# Patient Record
Sex: Male | Born: 1950 | Race: Black or African American | Hispanic: No | Smoking: Never smoker
Health system: Southern US, Community
[De-identification: ages and names within clinical notes are randomized; demographics above are authoritative.]

## PROBLEM LIST (undated history)

## (undated) DIAGNOSIS — I10 Essential (primary) hypertension: Secondary | ICD-10-CM

## (undated) DIAGNOSIS — I2510 Atherosclerotic heart disease of native coronary artery without angina pectoris: Secondary | ICD-10-CM

## (undated) DIAGNOSIS — E119 Type 2 diabetes mellitus without complications: Secondary | ICD-10-CM

## (undated) HISTORY — PX: KNEE SURGERY: SHX244

---

## 2014-02-02 DIAGNOSIS — R351 Nocturia: Secondary | ICD-10-CM

## 2014-02-02 DIAGNOSIS — I10 Essential (primary) hypertension: Secondary | ICD-10-CM | POA: Insufficient documentation

## 2014-02-02 DIAGNOSIS — E119 Type 2 diabetes mellitus without complications: Secondary | ICD-10-CM

## 2014-02-02 DIAGNOSIS — N529 Male erectile dysfunction, unspecified: Secondary | ICD-10-CM | POA: Insufficient documentation

## 2014-02-02 HISTORY — DX: Nocturia: R35.1

## 2014-02-02 HISTORY — DX: Male erectile dysfunction, unspecified: N52.9

## 2014-02-02 HISTORY — DX: Type 2 diabetes mellitus without complications: E11.9

## 2017-02-27 ENCOUNTER — Other Ambulatory Visit: Payer: Self-pay

## 2017-02-27 ENCOUNTER — Emergency Department (HOSPITAL_BASED_OUTPATIENT_CLINIC_OR_DEPARTMENT_OTHER): Payer: Medicare Other

## 2017-02-27 ENCOUNTER — Inpatient Hospital Stay (HOSPITAL_BASED_OUTPATIENT_CLINIC_OR_DEPARTMENT_OTHER)
Admission: EM | Admit: 2017-02-27 | Discharge: 2017-02-28 | DRG: 282 | Disposition: A | Discharge status: Home / Self Care | Payer: Medicare Other | Attending: Cardiovascular Disease | Admitting: Cardiovascular Disease

## 2017-02-27 ENCOUNTER — Encounter (HOSPITAL_BASED_OUTPATIENT_CLINIC_OR_DEPARTMENT_OTHER): Payer: Self-pay

## 2017-02-27 DIAGNOSIS — E785 Hyperlipidemia, unspecified: Secondary | ICD-10-CM | POA: Diagnosis present

## 2017-02-27 DIAGNOSIS — Z7984 Long term (current) use of oral hypoglycemic drugs: Secondary | ICD-10-CM

## 2017-02-27 DIAGNOSIS — Z8249 Family history of ischemic heart disease and other diseases of the circulatory system: Secondary | ICD-10-CM

## 2017-02-27 DIAGNOSIS — E119 Type 2 diabetes mellitus without complications: Secondary | ICD-10-CM | POA: Diagnosis not present

## 2017-02-27 DIAGNOSIS — I2510 Atherosclerotic heart disease of native coronary artery without angina pectoris: Secondary | ICD-10-CM | POA: Diagnosis present

## 2017-02-27 DIAGNOSIS — E7849 Other hyperlipidemia: Secondary | ICD-10-CM | POA: Diagnosis not present

## 2017-02-27 DIAGNOSIS — I214 Non-ST elevation (NSTEMI) myocardial infarction: Secondary | ICD-10-CM

## 2017-02-27 DIAGNOSIS — I10 Essential (primary) hypertension: Secondary | ICD-10-CM | POA: Diagnosis not present

## 2017-02-27 DIAGNOSIS — N400 Enlarged prostate without lower urinary tract symptoms: Secondary | ICD-10-CM | POA: Diagnosis present

## 2017-02-27 DIAGNOSIS — E041 Nontoxic single thyroid nodule: Secondary | ICD-10-CM | POA: Diagnosis not present

## 2017-02-27 HISTORY — DX: Non-ST elevation (NSTEMI) myocardial infarction: I21.4

## 2017-02-27 HISTORY — DX: Essential (primary) hypertension: I10

## 2017-02-27 HISTORY — DX: Type 2 diabetes mellitus without complications: E11.9

## 2017-02-27 LAB — HEPATIC FUNCTION PANEL
ALBUMIN: 4.5 g/dL (ref 3.5–5.0)
ALT: 33 U/L (ref 17–63)
AST: 24 U/L (ref 15–41)
Alkaline Phosphatase: 85 U/L (ref 38–126)
BILIRUBIN TOTAL: 0.9 mg/dL (ref 0.3–1.2)
Bilirubin, Direct: 0.2 mg/dL (ref 0.1–0.5)
Indirect Bilirubin: 0.7 mg/dL (ref 0.3–0.9)
Total Protein: 7.3 g/dL (ref 6.5–8.1)

## 2017-02-27 LAB — CBC
HEMATOCRIT: 48.7 % (ref 39.0–52.0)
HEMOGLOBIN: 16.8 g/dL (ref 13.0–17.0)
MCH: 32 pg (ref 26.0–34.0)
MCHC: 34.5 g/dL (ref 30.0–36.0)
MCV: 92.8 fL (ref 78.0–100.0)
PLATELETS: 197 10*3/uL (ref 150–400)
RBC: 5.25 MIL/uL (ref 4.22–5.81)
RDW: 13.4 % (ref 11.5–15.5)
WBC: 7.3 10*3/uL (ref 4.0–10.5)

## 2017-02-27 LAB — BASIC METABOLIC PANEL
ANION GAP: 10 (ref 5–15)
BUN: 18 mg/dL (ref 6–20)
CALCIUM: 9.4 mg/dL (ref 8.9–10.3)
CO2: 25 mmol/L (ref 22–32)
Chloride: 100 mmol/L — ABNORMAL LOW (ref 101–111)
Creatinine, Ser: 0.96 mg/dL (ref 0.61–1.24)
GFR calc non Af Amer: >60 mL/min (ref >60)
GLUCOSE: 165 mg/dL — AB (ref 65–99)
POTASSIUM: 3.6 mmol/L (ref 3.5–5.1)
Sodium: 135 mmol/L (ref 135–145)

## 2017-02-27 LAB — GLUCOSE, CAPILLARY
GLUCOSE-CAPILLARY: 245 mg/dL — AB (ref 65–99)
Glucose-Capillary: 140 mg/dL — ABNORMAL HIGH (ref 65–99)

## 2017-02-27 LAB — LIPASE, BLOOD: Lipase: 33 U/L (ref 11–51)

## 2017-02-27 LAB — TROPONIN I
Troponin I: 0.18 ng/mL (ref <0.03)
Troponin I: 0.16 ng/mL (ref <0.03)

## 2017-02-27 MED ORDER — METOPROLOL SUCCINATE ER 100 MG PO TB24
100.0000 mg | ORAL_TABLET | Freq: Every day | ORAL | Status: DC
  Administered 2017-02-27 – 2017-02-28 (×2): 100 mg via ORAL
  Filled 2017-02-27 (×2): qty 1

## 2017-02-27 MED ORDER — ASPIRIN EC 81 MG PO TBEC
81.0000 mg | DELAYED_RELEASE_TABLET | Freq: Every day | ORAL | Status: DC
  Administered 2017-02-28: 81 mg via ORAL
  Filled 2017-02-27: qty 1

## 2017-02-27 MED ORDER — AMLODIPINE BESYLATE 5 MG PO TABS
5.0000 mg | ORAL_TABLET | Freq: Every day | ORAL | Status: DC
  Administered 2017-02-27 – 2017-02-28 (×2): 5 mg via ORAL
  Filled 2017-02-27 (×2): qty 1

## 2017-02-27 MED ORDER — HEPARIN (PORCINE) IN NACL 100-0.45 UNIT/ML-% IJ SOLN
16.0000 [IU]/kg/h | INTRAMUSCULAR | Status: DC
  Administered 2017-02-27: 16 [IU]/kg/h via INTRAVENOUS
  Filled 2017-02-27: qty 250

## 2017-02-27 MED ORDER — NITROGLYCERIN 0.4 MG SL SUBL
0.4000 mg | SUBLINGUAL_TABLET | SUBLINGUAL | Status: DC | PRN
  Administered 2017-02-27 (×2): 0.4 mg via SUBLINGUAL
  Filled 2017-02-27: qty 1

## 2017-02-27 MED ORDER — LISINOPRIL 40 MG PO TABS
40.0000 mg | ORAL_TABLET | Freq: Every day | ORAL | Status: DC
  Administered 2017-02-27 – 2017-02-28 (×2): 40 mg via ORAL
  Filled 2017-02-27 (×2): qty 1

## 2017-02-27 MED ORDER — HYDROCHLOROTHIAZIDE 25 MG PO TABS
25.0000 mg | ORAL_TABLET | Freq: Every day | ORAL | Status: DC
  Administered 2017-02-27: 25 mg via ORAL
  Filled 2017-02-27: qty 1

## 2017-02-27 MED ORDER — IOPAMIDOL (ISOVUE-370) INJECTION 76%
100.0000 mL | Freq: Once | INTRAVENOUS | Status: AC | PRN
  Administered 2017-02-27: 100 mL via INTRAVENOUS

## 2017-02-27 MED ORDER — ASPIRIN 81 MG PO CHEW
324.0000 mg | CHEWABLE_TABLET | Freq: Once | ORAL | Status: AC
  Administered 2017-02-27: 324 mg via ORAL
  Filled 2017-02-27: qty 4

## 2017-02-27 MED ORDER — ONDANSETRON HCL 4 MG/2ML IJ SOLN: 4.0000 mg | Freq: Four times a day (QID) | INTRAMUSCULAR | Status: DC | PRN

## 2017-02-27 MED ORDER — ACETAMINOPHEN 325 MG PO TABS: 650.0000 mg | ORAL_TABLET | ORAL | Status: DC | PRN

## 2017-02-27 MED ORDER — ATORVASTATIN CALCIUM 20 MG PO TABS
20.0000 mg | ORAL_TABLET | Freq: Every day | ORAL | Status: DC
  Administered 2017-02-27: 20 mg via ORAL
  Filled 2017-02-27: qty 1

## 2017-02-27 MED ORDER — HEPARIN (PORCINE) IN NACL 100-0.45 UNIT/ML-% IJ SOLN
1700.0000 [IU]/h | INTRAMUSCULAR | Status: DC
  Administered 2017-02-28: 1700 [IU]/h via INTRAVENOUS
  Filled 2017-02-27: qty 250

## 2017-02-27 MED ORDER — HEPARIN BOLUS VIA INFUSION
4000.0000 [IU] | Freq: Once | INTRAVENOUS | Status: AC
  Administered 2017-02-27: 4000 [IU] via INTRAVENOUS

## 2017-02-27 NOTE — ED Notes (Addendum)
RN from [facility] called to advise pt was waiting in their lobby but left. They had drawn blood and his troponin was 0.11. EDP made aware.

## 2017-02-27 NOTE — ED Notes (Signed)
Report called Harvie HeckPaulette Tete, RN

## 2017-02-27 NOTE — ED Provider Notes (Signed)
[facility] EMERGENCY DEPARTMENT Provider Note   CSN: [id number] Arrival date & time: 02/27/17  1110     History   Chief Complaint Chief Complaint  Patient presents with  . Chest Pain    HPI Tyler Ellis is a 66 y.o. male.  HPI Patient states he had central chest pressure that woke him from sleep at 3 AM.  Associated with diaphoresis, nausea, shortness of breath.  States he has had intermittent left arm numbness for the past month.  The chest pressure has significantly improved.  Has bilateral lower extremity swelling which is unchanged.  No prior history of coronary artery disease.  No family history.  Denies history of smoking. Past Medical History:  Diagnosis Date  . Diabetes mellitus without complication (HCC)   . Hypertension     There are no active problems to display for this patient.   Past Surgical History:  Procedure Laterality Date  . KNEE SURGERY         Home Medications    Prior to Admission medications   Medication Sig Start Date End Date Taking? Authorizing Provider  acetaminophen-codeine (TYLENOL #3) 300-30 MG tablet Take every 4 (four) hours as needed by mouth for moderate pain.   Yes [provider]  amLODipine (NORVASC) 5 MG tablet Take 5 mg daily by mouth.   Yes [provider]  atorvastatin (LIPITOR) 20 MG tablet Take 20 mg daily by mouth.   Yes [provider]  diazepam (VALIUM) 5 MG tablet Take 5 mg every 6 (six) hours as needed by mouth for anxiety.   Yes [provider]  glipiZIDE (GLUCOTROL) 10 MG tablet Take 10 mg daily before breakfast by mouth.   Yes [provider]  hydrochlorothiazide (HYDRODIURIL) 25 MG tablet Take 25 mg daily by mouth.   Yes [provider]  lisinopril (PRINIVIL,ZESTRIL) 40 MG tablet Take 40 mg daily by mouth.   Yes [provider]  metFORMIN (GLUCOPHAGE) 1000 MG tablet Take 500 mg 2 (two) times daily with a meal by mouth.   Yes [provider]  metoprolol succinate (TOPROL-XL) 100 MG 24 hr tablet Take 100 mg daily by mouth. Take with or immediately following a meal.   Yes [provider]    Family History No family history on file.  Social History Social History   Tobacco Use  . Smoking status: Never Smoker  . Smokeless tobacco: Never Used  Substance Use Topics  . Alcohol use: Yes    Comment: weekly  . Drug use: No     Allergies   Patient has no known allergies.   Review of Systems Review of Systems  Constitutional: Positive for diaphoresis. Negative for chills and fever.  Respiratory: Positive for shortness of breath. Negative for cough.   Cardiovascular: Positive for chest pain and leg swelling. Negative for palpitations.  Gastrointestinal: Positive for abdominal pain and nausea. Negative for diarrhea and vomiting.  Genitourinary: Negative for flank pain, frequency and hematuria.  Musculoskeletal: Positive for back pain and myalgias. Negative for neck pain and neck stiffness.  Skin: Negative for rash and wound.  Neurological: Positive for numbness and headaches. Negative for dizziness, weakness and light-headedness.  All other systems reviewed and are negative.    Physical Exam Updated Vital Signs BP 104/81   Pulse 100   Temp 98.6 F (37 C) (Oral)   Resp (!) 23   Ht 5\' 7"  (1.702 m)   Wt 104.7 kg (230 lb 13.2 oz)   SpO2  98%   BMI 36.15 kg/m   Physical Exam  Constitutional: He is oriented to person, place, and time. He appears well-developed and well-nourished.  Non-toxic appearance. He does not appear ill. No distress.  HENT:  Head: Normocephalic and atraumatic.  Mouth/Throat: Oropharynx is clear and moist.  Eyes: EOM are normal. Pupils are equal, round, and reactive to light.  Neck: Normal range of motion. Neck supple. No JVD present.  Cardiovascular: Normal rate, regular rhythm and normal pulses.  Pulmonary/Chest: Effort normal and breath sounds normal. No accessory  muscle usage or stridor. No tachypnea. No respiratory distress. He has no decreased breath sounds.  Abdominal: Soft. Bowel sounds are normal. There is tenderness. There is no rebound and no guarding.  Patient with left upper quadrant tenderness to palpation.  No rebound or guarding.  Musculoskeletal: Normal range of motion. He exhibits no edema or tenderness.  1+ bilateral lower extremity pitting edema.  No calf asymmetry or tenderness.  Neurological: He is alert and oriented to person, place, and time.  Moving all extremities without focal deficit.  Sensation intact.  Skin: Skin is warm and dry. Capillary refill takes less than 2 seconds. No rash noted. No erythema.  Psychiatric: He has a normal mood and affect. His behavior is normal.  Nursing note and vitals reviewed.    ED Treatments / Results  Labs (all labs ordered are listed, but only abnormal results are displayed) Labs Reviewed  TROPONIN I - Abnormal; Notable for the following components:      Result Value   Troponin I 0.18 (*)    All other components within normal limits  BASIC METABOLIC PANEL - Abnormal; Notable for the following components:   Chloride 100 (*)    Glucose, Bld 165 (*)    All other components within normal limits  CBC  HEPATIC FUNCTION PANEL  LIPASE, BLOOD    EKG  EKG Interpretation  Date/Time:  Wednesday February 27 2017 11:20:53 EST Ventricular Rate:  112 PR Interval:  148 QRS Duration: 80 QT Interval:  344 QTC Calculation: 469 R Axis:   18 Text Interpretation:  Sinus tachycardia Otherwise normal ECG Confirmed by Loren Racer ([id number]) on 02/27/2017 3:21:44 PM       Radiology Dg Chest 2 View  Result Date: 02/27/2017 CLINICAL DATA:  Chest pain EXAM: CHEST  2 VIEW COMPARISON:  02/27/2017 FINDINGS: The heart size and mediastinal contours are within normal limits. Both lungs are clear. The visualized skeletal structures are unremarkable. IMPRESSION: No active cardiopulmonary disease.  Electronically Signed   By: Kennith Center M.D.   On: 02/27/2017 12:44   Ct Angio Chest Aorta W And/or Wo Contrast  Result Date: 02/27/2017 CLINICAL DATA:  Chest pain since early this morning, elevated troponin level. History of hypertension, diabetes. EXAM: CT ANGIOGRAPHY CHEST, ABDOMEN AND PELVIS TECHNIQUE: Multidetector CT imaging through the chest, abdomen and pelvis was performed using the standard protocol during bolus administration of intravenous contrast. Multiplanar reconstructed images and MIPs were obtained and reviewed to evaluate the vascular anatomy. CONTRAST:  100 cc Isovue 370 COMPARISON:  None. FINDINGS: CTA CHEST FINDINGS Cardiovascular: No thoracic aortic aneurysm or dissection. No evidence of intramural hematoma. Heart size is normal. No pericardial effusion. Scattered coronary artery calcifications. No pulmonary embolism identified within the main or central lobar pulmonary arteries. Mediastinum/Nodes: Esophagus appears normal. No mass or enlarged lymph nodes within the mediastinum or perihilar regions. 1.9 cm hypodense lesion within the left thyroid lobe. Trachea and central bronchi are unremarkable. Lungs/Pleura: Lungs are clear.  No pleural effusion or pneumothorax. Musculoskeletal: Mild degenerative change in the lower thoracic spine. No acute or suspicious osseous finding. Review of the MIP images confirms the above findings. CTA ABDOMEN AND PELVIS FINDINGS VASCULAR Aorta: Normal caliber aorta without aneurysm, dissection, vasculitis or significant stenosis. Celiac: Patent without evidence of aneurysm, dissection, vasculitis or significant stenosis. SMA: Patent without evidence of aneurysm, dissection, vasculitis or significant stenosis. Renals: Both renal arteries are patent without evidence of aneurysm, dissection, vasculitis, fibromuscular dysplasia or significant stenosis. IMA: Patent without evidence of aneurysm, dissection, vasculitis or significant stenosis. Inflow: Patent  without evidence of aneurysm, dissection, vasculitis or significant stenosis. Veins: No obvious venous abnormality within the limitations of this arterial phase study. Review of the MIP images confirms the above findings. NON-VASCULAR Hepatobiliary: No focal liver abnormality is seen. No gallstones, gallbladder wall thickening, or biliary dilatation. Pancreas: Unremarkable. No pancreatic ductal dilatation or surrounding inflammatory changes. Spleen: Normal in size without focal abnormality. Adrenals/Urinary Tract: Adrenal glands appear normal. Kidneys are unremarkable without mass, stone or hydronephrosis. No perinephric fluid. No ureteral or bladder calculi identified. Bladder is unremarkable, partially decompressed. Stomach/Bowel: Bowel is normal in caliber. No bowel wall thickening or evidence of bowel wall inflammation seen. Appendix is normal. Stomach is unremarkable. Lymphatic: No enlarged lymph nodes seen. Reproductive: Prostate gland is enlarged causing some mass effect on the bladder base. Other: No free fluid or abscess collection. No free intraperitoneal air. Musculoskeletal: Degenerative disc desiccations throughout the lumbar spine, at least moderate in degree. No acute or suspicious osseous finding. Review of the MIP images confirms the above findings. IMPRESSION: 1. Overall, no acute findings. No thoracic aortic aneurysm or dissection. No abdominal aortic aneurysm or dissection. Lungs are clear. No acute intra-abdominal or intrapelvic abnormality. 2. Coronary artery calcifications. Recommend correlation with any possible associated cardiac symptoms. Heart size is normal. No pericardial effusion. 3. 1.9 cm hypodense lesion within the left thyroid lobe. Per consensus guidelines, recommend nonemergent thyroid ultrasound for further characterization. 4. Prostate gland is at least moderately enlarged, causing mass effect on the bladder base. Consider correlation with PSA lab values. 5. 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 Milder degenerative change within the thoracic spine. No acute appearing osseous abnormality Electronically Signed   By: Bary RichardStan  Maynard M.D.   On: 02/27/2017 14:48   Ct Angio Abd/pel W/ And/or W/o  Result Date: 02/27/2017 CLINICAL DATA:  Chest pain since early this morning, elevated troponin level. History of hypertension, diabetes. EXAM: CT ANGIOGRAPHY CHEST, ABDOMEN AND PELVIS TECHNIQUE: Multidetector CT imaging through the chest, abdomen and pelvis was performed using the standard protocol during bolus administration of intravenous contrast. Multiplanar reconstructed images and MIPs were obtained and reviewed to evaluate the vascular anatomy. CONTRAST:  100 cc Isovue 370 COMPARISON:  None. FINDINGS: CTA CHEST FINDINGS Cardiovascular: No thoracic aortic aneurysm or dissection. No evidence of intramural hematoma. Heart size is normal. No pericardial effusion. Scattered coronary artery calcifications. No pulmonary embolism identified within the main or central lobar pulmonary arteries. Mediastinum/Nodes: Esophagus appears normal. No mass or enlarged lymph nodes within the mediastinum or perihilar regions. 1.9 cm hypodense lesion within the left thyroid lobe. Trachea and central bronchi are unremarkable. Lungs/Pleura: Lungs are clear.  No pleural effusion or pneumothorax. Musculoskeletal: Mild degenerative change in the lower thoracic spine. No acute or suspicious osseous finding. Review of the MIP images confirms the above findings. CTA ABDOMEN AND PELVIS FINDINGS VASCULAR Aorta: Normal caliber aorta without aneurysm, dissection, vasculitis or significant stenosis. Celiac: Patent without evidence  of aneurysm, dissection, vasculitis or significant stenosis. SMA: Patent without evidence of aneurysm, dissection, vasculitis or significant stenosis. Renals: Both renal arteries are patent without evidence of aneurysm, dissection, vasculitis, fibromuscular  dysplasia or significant stenosis. IMA: Patent without evidence of aneurysm, dissection, vasculitis or significant stenosis. Inflow: Patent without evidence of aneurysm, dissection, vasculitis or significant stenosis. Veins: No obvious venous abnormality within the limitations of this arterial phase study. Review of the MIP images confirms the above findings. NON-VASCULAR Hepatobiliary: No focal liver abnormality is seen. No gallstones, gallbladder wall thickening, or biliary dilatation. Pancreas: Unremarkable. No pancreatic ductal dilatation or surrounding inflammatory changes. Spleen: Normal in size without focal abnormality. Adrenals/Urinary Tract: Adrenal glands appear normal. Kidneys are unremarkable without mass, stone or hydronephrosis. No perinephric fluid. No ureteral or bladder calculi identified. Bladder is unremarkable, partially decompressed. Stomach/Bowel: Bowel is normal in caliber. No bowel wall thickening or evidence of bowel wall inflammation seen. Appendix is normal. Stomach is unremarkable. Lymphatic: No enlarged lymph nodes seen. Reproductive: Prostate gland is enlarged causing some mass effect on the bladder base. Other: No free fluid or abscess collection. No free intraperitoneal air. Musculoskeletal: Degenerative disc desiccations throughout the lumbar spine, at least moderate in degree. No acute or suspicious osseous finding. Review of the MIP images confirms the above findings. IMPRESSION: 1. Overall, no acute findings. No thoracic aortic aneurysm or dissection. No abdominal aortic aneurysm or dissection. Lungs are clear. No acute intra-abdominal or intrapelvic abnormality. 2. Coronary artery calcifications. Recommend correlation with any possible associated cardiac symptoms. Heart size is normal. No pericardial effusion. 3. 1.9 cm hypodense lesion within the left thyroid lobe. Per consensus guidelines, recommend nonemergent thyroid ultrasound for further characterization. 4. Prostate gland  is at least moderately enlarged, causing mass effect on the bladder base. Consider correlation with PSA lab values. 5. 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 Milder degenerative change within the thoracic spine. No acute appearing osseous abnormality Electronically Signed   By: Bary RichardStan  Maynard M.D.   On: 02/27/2017 14:48    Procedures Procedures (including critical care time)  Medications Ordered in ED Medications  nitroGLYCERIN (NITROSTAT) SL tablet 0.4 mg (0.4 mg Sublingual Given 02/27/17 1213)  heparin bolus via infusion 4,000 Units (not administered)  heparin ADULT infusion 100 units/mL (25000 units/26250mL sodium chloride 0.45%) (not administered)  aspirin chewable tablet 324 mg (324 mg Oral Given 02/27/17 1206)  iopamidol (ISOVUE-370) 76 % injection 100 mL (100 mLs Intravenous Contrast Given 02/27/17 1420)     Initial Impression / Assessment and Plan / ED Course  I have reviewed the triage vital signs and the nursing notes.  Pertinent labs & imaging results that were available during my care of the patient were reviewed by me and considered in my medical decision making (see chart for details).     Patient is now chest pain-free.  Received nitroglycerin and aspirin in the emergency department.  CT angios chest and abdomen pelvis without evidence of dissection.  He does have calcified coronary arteries.  Troponin is elevated.  Discussed with Dr. Tresa EndoKelly.  Advises heparin and [facility].  States the patient can eat at this point.  We will plan on likely catheterization tomorrow.    Final Clinical Impressions(s) / ED Diagnoses   Final diagnoses:  NSTEMI (non-ST elevated myocardial infarction) [facility](HCC)    ED Discharge Orders    None       Loren RacerYelverton, Ion Gonnella, MD 02/27/17 [phone]

## 2017-02-27 NOTE — Progress Notes (Signed)
ANTICOAGULATION CONSULT NOTE   Pharmacy Consult for heparin  Indication: chest pain/ACS  No Known Allergies  Patient Measurements: Height: 5\' 7"  (170.2 cm) Weight: 230 lb 13.2 oz (104.7 kg) IBW/kg (Calculated) : 66.1  Vital Signs: Temp: 98.6 F (37 C) (11/07 1126) Temp Source: Oral (11/07 1126) BP: 125/76 (11/07 1738) Pulse Rate: 99 (11/07 1738)  Labs: Recent Labs    02/27/17 1144 02/27/17 1240  HGB 16.8  --   HCT 48.7  --   PLT 197  --   CREATININE  --  0.96  TROPONINI 0.18*  --     Estimated Creatinine Clearance: 87.3 mL/min (by C-G formula based on SCr of 0.96 mg/dL).   Medical History: Past Medical History:  Diagnosis Date  . Diabetes mellitus without complication (HCC)   . Hypertension     Assessment:  66 yo male with CP/NSTEMI that [facility]. Currently heparin infusion at 1700 units/hr. CBC stable.    Goal of Therapy:  Heparin level 0.3-0.7 units/ml Monitor platelets by anticoagulation protocol: Yes   Plan:  1. Continue heparin infusion at current rate  2. Obtain heparin level  3. Noted plans for cath in am   Pollyann SamplesAndy Delayla Hoffmaster, PharmD, BCPS 02/27/2017, 7:03 PM

## 2017-02-27 NOTE — ED Triage Notes (Signed)
C/o CP since 3am-[facility]-NAD-steady gait

## 2017-02-27 NOTE — ED Notes (Signed)
Obt[facility] from [facility]

## 2017-02-27 NOTE — H&P (Signed)
History & Physical    Patient ID: Tyler Ellis MRN: [id number], DOB/AGE: 1951/01/26   Admit date: 02/27/2017   Primary Physician: Forrest Moron, MD Primary Cardiologist: New    Patient Profile    66 year old male with past medical history of hypertension, hyperlipidemia, insulin-dependent diabetes who presented to [facility] with chest pain.  Past Medical History   Past Medical History:  Diagnosis Date  . Diabetes mellitus without complication (HCC)   . Hypertension     Past Surgical History:  Procedure Laterality Date  . KNEE SURGERY       Allergies  No Known Allergies  History of Present Illness    Tyler Ellis is a 66 year old male with past medical history of hypertension, hyperlipidemia and insulin-dependent diabetes.  He denies any significant family history of CAD, but reports that his sister had a "light heart attack "several years ago.  States he has been on insulin for about a year, but has had diabetes for over 5 years.  Denies ever having seen a cardiologist in the past.  Over the past several weeks he has had left arm numbness, and seen his PCP for this.  States this morning at 3 AM he was awoken from sleep with centralized chest pressure, that brought him to tears.  States he woke up and attempted to place ice to his chest, and walk around to relieve the pain.  Pressure continued, and he eventually presented to med [facility] with his symptoms, after speaking with his PCP over the phone.  In the ED his labs showed stable electrolytes, creatinine 0.96, hemoglobin 16.8, troponin 0.18.  CT angios chest was negative for acute findings.  EKG showed sinus tachycardia with no acute ST/T wave abnormalities.  His case was discussed with cardiology, and he was started on IV heparin.  He was transferred to call for further workup.  Home Medications    Prior to Admission medications   Medication Sig Start Date End Date Taking? Authorizing Provider    acetaminophen-codeine (TYLENOL #3) 300-30 MG tablet Take every 4 (four) hours as needed by mouth for moderate pain.    [provider]  amLODipine (NORVASC) 5 MG tablet Take 5 mg daily by mouth.    [provider]  atorvastatin (LIPITOR) 20 MG tablet Take 20 mg daily by mouth.    [provider]  diazepam (VALIUM) 5 MG tablet Take 5 mg every 6 (six) hours as needed by mouth for anxiety.    [provider]  glipiZIDE (GLUCOTROL) 10 MG tablet Take 10 mg daily before breakfast by mouth.    [provider]  hydrochlorothiazide (HYDRODIURIL) 25 MG tablet Take 25 mg daily by mouth.    [provider]  lisinopril (PRINIVIL,ZESTRIL) 40 MG tablet Take 40 mg daily by mouth.    [provider]  metFORMIN (GLUCOPHAGE) 1000 MG tablet Take 500 mg 2 (two) times daily with a meal by mouth.    [provider]  metoprolol succinate (TOPROL-XL) 100 MG 24 hr tablet Take 100 mg daily by mouth. Take with or immediately following a meal.    [provider]    Family History    Family History  Problem Relation Age of Onset  . Hypertension Father     Social History    Social History   Socioeconomic History  . Marital status: Divorced    Spouse name: Not on file  . Number of children: Not on file  . Years of  education: Not on file  . Highest education level: Not on file  Social Needs  . Financial resource strain: Not on file  . Food insecurity - worry: Not on file  . Food insecurity - inability: Not on file  . Transportation needs - medical: Not on file  . Transportation needs - non-medical: Not on file  Occupational History  . Not on file  Tobacco Use  . Smoking status: Never Smoker  . Smokeless tobacco: Never Used  Substance and Sexual Activity  . Alcohol use: Yes    Comment: weekly  . Drug use: No  . Sexual activity: Not on file  Other Topics Concern  . Not on file  Social History Narrative  . Not on file      Review of Systems    See HPI All other systems reviewed and are otherwise negative except as noted above.  Physical Exam    Blood pressure 125/76, pulse 99, temperature 98.6 F (37 C), temperature source Oral, resp. rate 20, height 5\' 7"  (1.702 m), weight 230 lb 13.2 oz (104.7 kg), SpO2 98 %.  General: Pleasant, obese older African-American male, NAD Psych: Normal affect. Neuro: Alert and oriented X 3. Moves all extremities spontaneously. HEENT: Normal  Neck: Supple without bruits or JVD. Lungs:  Resp regular and unlabored, CTA. Heart: RRR no s3, s4, or murmurs. Abdomen: Soft, non-tender, non-distended, BS + x 4.  Extremities: No clubbing, cyanosis or edema. DP/PT/Radials 2+ and equal bilaterally.  Labs    Troponin (Point of Care Test) No results for input(s): TROPIPOC in the last 72 hours. Recent Labs    02/27/17 1144  TROPONINI 0.18*   Lab Results  Component Value Date   WBC 7.3 02/27/2017   HGB 16.8 02/27/2017   HCT 48.7 02/27/2017   MCV 92.8 02/27/2017   PLT 197 02/27/2017    Recent Labs  Lab 02/27/17 1240  NA 135  K 3.6  CL 100*  CO2 25  BUN 18  CREATININE 0.96  CALCIUM 9.4  PROT 7.3  BILITOT 0.9  ALKPHOS 85  ALT 33  AST 24  GLUCOSE 165*   No results found for: CHOL, HDL, LDLCALC, TRIG No results found for: [facility]   Radiology Studies    Dg Chest 2 View  Result Date: 02/27/2017 CLINICAL DATA:  Chest pain EXAM: CHEST  2 VIEW COMPARISON:  02/27/2017 FINDINGS: The heart size and mediastinal contours are within normal limits. Both lungs are clear. The visualized skeletal structures are unremarkable. IMPRESSION: No active cardiopulmonary disease. Electronically Signed   By: Kennith Center M.D.   On: 02/27/2017 12:44   Ct Angio Chest Aorta W And/or Wo Contrast  Result Date: 02/27/2017 CLINICAL DATA:  Chest pain since early this morning, elevated troponin level. History of hypertension, diabetes. EXAM: CT ANGIOGRAPHY CHEST, ABDOMEN AND PELVIS TECHNIQUE:  Multidetector CT imaging through the chest, abdomen and pelvis was performed using the standard protocol during bolus administration of intravenous contrast. Multiplanar reconstructed images and MIPs were obtained and reviewed to evaluate the vascular anatomy. CONTRAST:  100 cc Isovue 370 COMPARISON:  None. FINDINGS: CTA CHEST FINDINGS Cardiovascular: No thoracic aortic aneurysm or dissection. No evidence of intramural hematoma. Heart size is normal. No pericardial effusion. Scattered coronary artery calcifications. No pulmonary embolism identified within the main or central lobar pulmonary arteries. Mediastinum/Nodes: Esophagus appears normal. No mass or enlarged lymph nodes within the mediastinum or perihilar regions. 1.9 cm hypodense lesion within the left thyroid lobe. Trachea and central bronchi are unremarkable.  Lungs/Pleura: Lungs are clear.  No pleural effusion or pneumothorax. Musculoskeletal: Mild degenerative change in the lower thoracic spine. No acute or suspicious osseous finding. Review of the MIP images confirms the above findings. CTA ABDOMEN AND PELVIS FINDINGS VASCULAR Aorta: Normal caliber aorta without aneurysm, dissection, vasculitis or significant stenosis. Celiac: Patent without evidence of aneurysm, dissection, vasculitis or significant stenosis. SMA: Patent without evidence of aneurysm, dissection, vasculitis or significant stenosis. Renals: Both renal arteries are patent without evidence of aneurysm, dissection, vasculitis, fibromuscular dysplasia or significant stenosis. IMA: Patent without evidence of aneurysm, dissection, vasculitis or significant stenosis. Inflow: Patent without evidence of aneurysm, dissection, vasculitis or significant stenosis. Veins: No obvious venous abnormality within the limitations of this arterial phase study. Review of the MIP images confirms the above findings. NON-VASCULAR Hepatobiliary: No focal liver abnormality is seen. No gallstones, gallbladder wall  thickening, or biliary dilatation. Pancreas: Unremarkable. No pancreatic ductal dilatation or surrounding inflammatory changes. Spleen: Normal in size without focal abnormality. Adrenals/Urinary Tract: Adrenal glands appear normal. Kidneys are unremarkable without mass, stone or hydronephrosis. No perinephric fluid. No ureteral or bladder calculi identified. Bladder is unremarkable, partially decompressed. Stomach/Bowel: Bowel is normal in caliber. No bowel wall thickening or evidence of bowel wall inflammation seen. Appendix is normal. Stomach is unremarkable. Lymphatic: No enlarged lymph nodes seen. Reproductive: Prostate gland is enlarged causing some mass effect on the bladder base. Other: No free fluid or abscess collection. No free intraperitoneal air. Musculoskeletal: Degenerative disc desiccations throughout the lumbar spine, at least moderate in degree. No acute or suspicious osseous finding. Review of the MIP images confirms the above findings. IMPRESSION: 1. Overall, no acute findings. No thoracic aortic aneurysm or dissection. No abdominal aortic aneurysm or dissection. Lungs are clear. No acute intra-abdominal or intrapelvic abnormality. 2. Coronary artery calcifications. Recommend correlation with any possible associated cardiac symptoms. Heart size is normal. No pericardial effusion. 3. 1.9 cm hypodense lesion within the left thyroid lobe. Per consensus guidelines, recommend nonemergent thyroid ultrasound for further characterization. 4. Prostate gland is at least moderately enlarged, causing mass effect on the bladder base. Consider correlation with PSA lab values. 5. 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 Milder degenerative change within the thoracic spine. No acute appearing osseous abnormality Electronically Signed   By: Bary RichardStan  Maynard M.D.   On: 02/27/2017 14:48   Ct Angio Abd/pel W/ And/or W/o  Result Date: 02/27/2017 CLINICAL DATA:  Chest pain since early this  morning, elevated troponin level. History of hypertension, diabetes. EXAM: CT ANGIOGRAPHY CHEST, ABDOMEN AND PELVIS TECHNIQUE: Multidetector CT imaging through the chest, abdomen and pelvis was performed using the standard protocol during bolus administration of intravenous contrast. Multiplanar reconstructed images and MIPs were obtained and reviewed to evaluate the vascular anatomy. CONTRAST:  100 cc Isovue 370 COMPARISON:  None. FINDINGS: CTA CHEST FINDINGS Cardiovascular: No thoracic aortic aneurysm or dissection. No evidence of intramural hematoma. Heart size is normal. No pericardial effusion. Scattered coronary artery calcifications. No pulmonary embolism identified within the main or central lobar pulmonary arteries. Mediastinum/Nodes: Esophagus appears normal. No mass or enlarged lymph nodes within the mediastinum or perihilar regions. 1.9 cm hypodense lesion within the left thyroid lobe. Trachea and central bronchi are unremarkable. Lungs/Pleura: Lungs are clear.  No pleural effusion or pneumothorax. Musculoskeletal: Mild degenerative change in the lower thoracic spine. No acute or suspicious osseous finding. Review of the MIP images confirms the above findings. CTA ABDOMEN AND PELVIS FINDINGS VASCULAR Aorta: Normal caliber aorta without aneurysm, dissection, vasculitis or significant  stenosis. Celiac: Patent without evidence of aneurysm, dissection, vasculitis or significant stenosis. SMA: Patent without evidence of aneurysm, dissection, vasculitis or significant stenosis. Renals: Both renal arteries are patent without evidence of aneurysm, dissection, vasculitis, fibromuscular dysplasia or significant stenosis. IMA: Patent without evidence of aneurysm, dissection, vasculitis or significant stenosis. Inflow: Patent without evidence of aneurysm, dissection, vasculitis or significant stenosis. Veins: No obvious venous abnormality within the limitations of this arterial phase study. Review of the MIP images  confirms the above findings. NON-VASCULAR Hepatobiliary: No focal liver abnormality is seen. No gallstones, gallbladder wall thickening, or biliary dilatation. Pancreas: Unremarkable. No pancreatic ductal dilatation or surrounding inflammatory changes. Spleen: Normal in size without focal abnormality. Adrenals/Urinary Tract: Adrenal glands appear normal. Kidneys are unremarkable without mass, stone or hydronephrosis. No perinephric fluid. No ureteral or bladder calculi identified. Bladder is unremarkable, partially decompressed. Stomach/Bowel: Bowel is normal in caliber. No bowel wall thickening or evidence of bowel wall inflammation seen. Appendix is normal. Stomach is unremarkable. Lymphatic: No enlarged lymph nodes seen. Reproductive: Prostate gland is enlarged causing some mass effect on the bladder base. Other: No free fluid or abscess collection. No free intraperitoneal air. Musculoskeletal: Degenerative disc desiccations throughout the lumbar spine, at least moderate in degree. No acute or suspicious osseous finding. Review of the MIP images confirms the above findings. IMPRESSION: 1. Overall, no acute findings. No thoracic aortic aneurysm or dissection. No abdominal aortic aneurysm or dissection. Lungs are clear. No acute intra-abdominal or intrapelvic abnormality. 2. Coronary artery calcifications. Recommend correlation with any possible associated cardiac symptoms. Heart size is normal. No pericardial effusion. 3. 1.9 cm hypodense lesion within the left thyroid lobe. Per consensus guidelines, recommend nonemergent thyroid ultrasound for further characterization. 4. Prostate gland is at least moderately enlarged, causing mass effect on the bladder base. Consider correlation with PSA lab values. 5. 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 Milder degenerative change within the thoracic spine. No acute appearing osseous abnormality Electronically Signed   By: Bary RichardStan  Maynard M.D.    On: 02/27/2017 14:48    ECG & Cardiac Imaging    EKG: Sinus tachycardia with no acute ST/T wave abnormalities  Assessment & Plan    66 year old male with past medical history of hypertension, hyperlipidemia, insulin-dependent diabetes who presented [facility] with chest pain.  1.  NSTEMI: Patient presents with chest pressure that woke him around 3 AM.  In the ED his initial troponin was 0.18.  He was started on IV heparin, and transferred to call for further workup.  Does have concerning risk factors including hypertension, hyperlipidemia and insulin-dependent diabetes.  Given his symptoms, risk factors and clinical presentation overall picture is concerning for ACS.  Will plan for cardiac cath in the a.m. -- The patient understands that risks included but are not limited to stroke (1 in 1000), death (1 in 1000), kidney failure [usually temporary] (1 in 500), bleeding (1 in 200), allergic reaction [possibly serious] (1 in 200).  -- continue IV heparin -- cycle troponins  2. HTN: stable  3. HL: on statin  4. IDDM: hold metformin --SSI while inpatient   Signed, Laverda PageLindsay Roberts, NP-C Pager [phone] 02/27/2017, 6:59 PM  The patient was seen and examined, and I agree with the history, physical exam, assessment and plan as documented above, with modifications as noted below. I have also personally reviewed all relevant documentation, old records, labs, and both radiographic and cardiovascular studies. I have also independently interpreted old and new ECG's.  66 yr  old male with aforementioned history (IDDM, HTN, HLD) and presentation hospitalized currently for chest pain and elevated troponin. Upon speaking further with him, he had been walking a short distance about 3 weeks ago and his friend noticed he was very short of breath. He also describes prior episodes of exertional chest tightness and dyspnea and left arm numbness.  He was awoken at 3 AM with severe  retrosternal chest pressure accompanied by diaphoresis and shortness of breath with lancinating pains in his right temporal region. He later developed pain in his back.  ECG shows sinus tachycardia with no acute ST-T abnormalities.  He is currently pain free.  Initial troponin is 0.18.   On physical exam, he is tachycardic. Lungs are clear. No carotid bruits. No leg swelling.  He is on ASA, beta blocker, IV heparin, and statin.  Will plan for coronary angiography on 02/28/17. Plan discussed with patient and 2 daughters and they are in agreement.   Prentice Docker, MD, [facility]  02/27/2017 7:22 PM

## 2017-02-27 NOTE — ED Notes (Signed)
ED Provider at bedside. 

## 2017-02-28 ENCOUNTER — Encounter (HOSPITAL_COMMUNITY): Payer: Self-pay | Admitting: Cardiovascular Disease

## 2017-02-28 ENCOUNTER — Encounter (HOSPITAL_COMMUNITY)
Admission: EM | Disposition: A | Discharge status: Home / Self Care | Payer: Self-pay | Source: Home / Self Care | Attending: Cardiovascular Disease

## 2017-02-28 DIAGNOSIS — E785 Hyperlipidemia, unspecified: Secondary | ICD-10-CM | POA: Diagnosis not present

## 2017-02-28 DIAGNOSIS — I214 Non-ST elevation (NSTEMI) myocardial infarction: Secondary | ICD-10-CM | POA: Diagnosis not present

## 2017-02-28 HISTORY — PX: LEFT HEART CATH AND CORONARY ANGIOGRAPHY: CATH118249

## 2017-02-28 LAB — BASIC METABOLIC PANEL
Anion gap: 14 (ref 5–15)
BUN: 16 mg/dL (ref 6–20)
CALCIUM: 9.6 mg/dL (ref 8.9–10.3)
CO2: 23 mmol/L (ref 22–32)
CREATININE: 1.01 mg/dL (ref 0.61–1.24)
Chloride: 100 mmol/L — ABNORMAL LOW (ref 101–111)
GFR calc Af Amer: >60 mL/min (ref >60)
GFR calc non Af Amer: >60 mL/min (ref >60)
GLUCOSE: 182 mg/dL — AB (ref 65–99)
Potassium: 3.8 mmol/L (ref 3.5–5.1)
Sodium: 137 mmol/L (ref 135–145)

## 2017-02-28 LAB — HEMOGLOBIN A1C
HEMOGLOBIN A1C: 7.8 % — AB (ref 4.8–5.6)
MEAN PLASMA GLUCOSE: 177.16 mg/dL

## 2017-02-28 LAB — HEPARIN LEVEL (UNFRACTIONATED)
HEPARIN UNFRACTIONATED: 0.69 [IU]/mL (ref 0.30–0.70)
HEPARIN UNFRACTIONATED: 0.54 [IU]/mL (ref 0.30–0.70)

## 2017-02-28 LAB — GLUCOSE, CAPILLARY
GLUCOSE-CAPILLARY: 188 mg/dL — AB (ref 65–99)
GLUCOSE-CAPILLARY: 219 mg/dL — AB (ref 65–99)
Glucose-Capillary: 189 mg/dL — ABNORMAL HIGH (ref 65–99)
Glucose-Capillary: 188 mg/dL — ABNORMAL HIGH (ref 65–99)

## 2017-02-28 LAB — LIPID PANEL
Cholesterol: 191 mg/dL (ref 0–200)
HDL: 52 mg/dL (ref >40)
LDL CALC: 81 mg/dL (ref 0–99)
Total CHOL/HDL Ratio: 3.7 RATIO
Triglycerides: 288 mg/dL — ABNORMAL HIGH (ref <150)
VLDL: 58 mg/dL — ABNORMAL HIGH (ref 0–40)

## 2017-02-28 LAB — TROPONIN I
TROPONIN I: 0.09 ng/mL — AB (ref <0.03)
Troponin I: 0.1 ng/mL (ref <0.03)

## 2017-02-28 LAB — MRSA PCR SCREENING: MRSA by PCR: NEGATIVE

## 2017-02-28 LAB — PROTIME-INR
INR: 1.04
PROTHROMBIN TIME: 13.5 s (ref 11.4–15.2)

## 2017-02-28 SURGERY — LEFT HEART CATH AND CORONARY ANGIOGRAPHY
Anesthesia: LOCAL

## 2017-02-28 MED ORDER — MIDAZOLAM HCL 2 MG/2ML IJ SOLN
INTRAMUSCULAR | Status: AC
  Filled 2017-02-28: qty 2

## 2017-02-28 MED ORDER — MIDAZOLAM HCL 2 MG/2ML IJ SOLN
INTRAMUSCULAR | Status: DC | PRN
  Administered 2017-02-28: 2 mg via INTRAVENOUS

## 2017-02-28 MED ORDER — IOHEXOL 350 MG/ML SOLN
INTRAVENOUS | Status: DC | PRN
  Administered 2017-02-28: 90 mL via INTRAVENOUS

## 2017-02-28 MED ORDER — SODIUM CHLORIDE 0.9% FLUSH: 3.0000 mL | INTRAVENOUS | Status: DC | PRN

## 2017-02-28 MED ORDER — IOPAMIDOL (ISOVUE-370) INJECTION 76%
INTRAVENOUS | Status: AC
  Filled 2017-02-28: qty 100

## 2017-02-28 MED ORDER — ASPIRIN 81 MG PO TBEC: 81.0000 mg | DELAYED_RELEASE_TABLET | Freq: Every day | ORAL | 11 refills | Status: DC

## 2017-02-28 MED ORDER — SODIUM CHLORIDE 0.9 % WEIGHT BASED INFUSION: 1.0000 mL/kg/h | INTRAVENOUS | Status: DC

## 2017-02-28 MED ORDER — HEPARIN (PORCINE) IN NACL 2-0.9 UNIT/ML-% IJ SOLN
INTRAMUSCULAR | Status: AC | PRN
  Administered 2017-02-28: 1000 mL via INTRA_ARTERIAL

## 2017-02-28 MED ORDER — SODIUM CHLORIDE 0.9% FLUSH: 3.0000 mL | Freq: Two times a day (BID) | INTRAVENOUS | Status: DC

## 2017-02-28 MED ORDER — LIDOCAINE HCL (PF) 1 % IJ SOLN
INTRAMUSCULAR | Status: DC | PRN
  Administered 2017-02-28: 2 mL

## 2017-02-28 MED ORDER — HEPARIN SODIUM (PORCINE) 1000 UNIT/ML IJ SOLN
INTRAMUSCULAR | Status: AC
  Filled 2017-02-28: qty 1

## 2017-02-28 MED ORDER — SODIUM CHLORIDE 0.9 % WEIGHT BASED INFUSION: 3.0000 mL/kg/h | INTRAVENOUS | Status: DC

## 2017-02-28 MED ORDER — ATORVASTATIN CALCIUM 80 MG PO TABS: 80.0000 mg | ORAL_TABLET | Freq: Every day | ORAL | 3 refills | Status: DC

## 2017-02-28 MED ORDER — INSULIN ASPART 100 UNIT/ML ~~LOC~~ SOLN: 0.0000 [IU] | Freq: Three times a day (TID) | SUBCUTANEOUS | Status: DC

## 2017-02-28 MED ORDER — HEPARIN (PORCINE) IN NACL 2-0.9 UNIT/ML-% IJ SOLN
INTRAMUSCULAR | Status: AC
  Filled 2017-02-28: qty 1000

## 2017-02-28 MED ORDER — SODIUM CHLORIDE 0.9 % WEIGHT BASED INFUSION
3.0000 mL/kg/h | INTRAVENOUS | Status: DC
  Administered 2017-02-28: 3 mL/kg/h via INTRAVENOUS

## 2017-02-28 MED ORDER — SODIUM CHLORIDE 0.9 % IV SOLN
INTRAVENOUS | Status: AC
  Administered 2017-02-28: 14:00:00 via INTRAVENOUS

## 2017-02-28 MED ORDER — VERAPAMIL HCL 2.5 MG/ML IV SOLN
INTRAVENOUS | Status: DC | PRN
  Administered 2017-02-28: 10 mL via INTRA_ARTERIAL

## 2017-02-28 MED ORDER — ATORVASTATIN CALCIUM 80 MG PO TABS
80.0000 mg | ORAL_TABLET | Freq: Every day | ORAL | Status: DC
  Administered 2017-02-28: 80 mg via ORAL
  Filled 2017-02-28: qty 1

## 2017-02-28 MED ORDER — FENTANYL CITRATE (PF) 100 MCG/2ML IJ SOLN
INTRAMUSCULAR | Status: DC | PRN
  Administered 2017-02-28: 25 ug via INTRAVENOUS

## 2017-02-28 MED ORDER — SODIUM CHLORIDE 0.9% FLUSH
3.0000 mL | Freq: Two times a day (BID) | INTRAVENOUS | Status: DC
  Administered 2017-02-28: 3 mL via INTRAVENOUS

## 2017-02-28 MED ORDER — SODIUM CHLORIDE 0.9 % IV SOLN: 250.0000 mL | INTRAVENOUS | Status: DC | PRN

## 2017-02-28 MED ORDER — HEPARIN SODIUM (PORCINE) 1000 UNIT/ML IJ SOLN
INTRAMUSCULAR | Status: DC | PRN
  Administered 2017-02-28: 5000 [IU] via INTRAVENOUS

## 2017-02-28 MED ORDER — LIDOCAINE HCL (PF) 1 % IJ SOLN
INTRAMUSCULAR | Status: AC
  Filled 2017-02-28: qty 30

## 2017-02-28 MED ORDER — VERAPAMIL HCL 2.5 MG/ML IV SOLN
INTRAVENOUS | Status: AC
  Filled 2017-02-28: qty 2

## 2017-02-28 MED ORDER — INSULIN ASPART 100 UNIT/ML ~~LOC~~ SOLN
0.0000 [IU] | Freq: Three times a day (TID) | SUBCUTANEOUS | Status: DC
Start: 2017-02-28 — End: 2017-02-28
  Administered 2017-02-28 (×2): 3 [IU] via SUBCUTANEOUS

## 2017-02-28 MED ORDER — HYDROCHLOROTHIAZIDE 25 MG PO TABS
25.0000 mg | ORAL_TABLET | Freq: Every day | ORAL | Status: DC
  Administered 2017-02-28: 25 mg via ORAL
  Filled 2017-02-28: qty 1

## 2017-02-28 MED ORDER — FENTANYL CITRATE (PF) 100 MCG/2ML IJ SOLN
INTRAMUSCULAR | Status: AC
  Filled 2017-02-28: qty 2

## 2017-02-28 SURGICAL SUPPLY — 10 items

## 2017-02-28 NOTE — Progress Notes (Signed)
Progress Note  Patient Name: Tyler Ellis Date of Encounter: 02/28/2017  Primary Cardiologist: Dr Purvis Sheffield  Subjective   No chest pain or dyspnea  Inpatient Medications    Scheduled Meds: . amLODipine  5 mg Oral Daily  . aspirin EC  81 mg Oral Daily  . atorvastatin  20 mg Oral q1800  . hydrochlorothiazide  25 mg Oral Daily  . lisinopril  40 mg Oral Daily  . metoprolol succinate  100 mg Oral Daily  . sodium chloride flush  3 mL Intravenous Q12H   Continuous Infusions: . sodium chloride    . sodium chloride 1 mL/kg/hr (02/28/17 0835)  . heparin 1,700 Units/hr (02/28/17 1610)   PRN Meds: sodium chloride, acetaminophen, nitroGLYCERIN, ondansetron (ZOFRAN) IV, sodium chloride flush   Vital Signs    Vitals:   02/28/17 0330 02/28/17 0626 02/28/17 0800 02/28/17 0801  BP: (!) 101/53 (!) 126/93 127/81 127/81  Pulse: 85 89 98   Resp: (!) 24 (!) 23 17   Temp:  98 F (36.7 C) 98.1 F (36.7 C) (!) 97.5 F (36.4 C)  TempSrc:  Oral Oral Oral  SpO2: 95% 95% 95% 94%  Weight:      Height:        Intake/Output Summary (Last 24 hours) at 02/28/2017 0936 Last data filed at 02/28/2017 9604 Gross per 24 hour  Intake 293 ml  Output 600 ml  Net -307 ml   Filed Weights   02/27/17 1127 02/27/17 1900  Weight: 230 lb 13.2 oz (104.7 kg) 225 lb (102.1 kg)    Telemetry    Sinus - Personally Reviewed   Physical Exam   GEN: No acute distress.   Neck: No JVD Cardiac: RRR, no murmurs, rubs, or gallops.  Respiratory: Clear to auscultation bilaterally. GI: Soft, nontender, non-distended  MS: No edema Neuro:  Nonfocal  Psych: Normal affect   Labs    Chemistry Recent Labs  Lab 02/27/17 1240 02/28/17 0625  NA 135 137  K 3.6 3.8  CL 100* 100*  CO2 25 23  GLUCOSE 165* 182*  BUN 18 16  CREATININE 0.96 1.01  CALCIUM 9.4 9.6  PROT 7.3  --   ALBUMIN 4.5  --   AST 24  --   ALT 33  --   ALKPHOS 85  --   BILITOT 0.9  --   GFRNONAA >60 >60  GFRAA >60 >60  ANIONGAP 10  14     Hematology Recent Labs  Lab 02/27/17 1144  WBC 7.3  RBC 5.25  HGB 16.8  HCT 48.7  MCV 92.8  MCH 32.0  MCHC 34.5  RDW 13.4  PLT 197    Cardiac Enzymes Recent Labs  Lab 02/27/17 1144 02/27/17 1929 02/28/17 0017 02/28/17 0625  TROPONINI 0.18* 0.16* 0.10* 0.09*    Radiology    Dg Chest 2 View  Result Date: 02/27/2017 CLINICAL DATA:  Chest pain EXAM: CHEST  2 VIEW COMPARISON:  02/27/2017 FINDINGS: The heart size and mediastinal contours are within normal limits. Both lungs are clear. The visualized skeletal structures are unremarkable. IMPRESSION: No active cardiopulmonary disease. Electronically Signed   By: Kennith Center M.D.   On: 02/27/2017 12:44   Ct Angio Chest Aorta W And/or Wo Contrast  Result Date: 02/27/2017 CLINICAL DATA:  Chest pain since early this morning, elevated troponin level. History of hypertension, diabetes. EXAM: CT ANGIOGRAPHY CHEST, ABDOMEN AND PELVIS TECHNIQUE: Multidetector CT imaging through the chest, abdomen and pelvis was performed using the standard protocol during bolus  administration of intravenous contrast. Multiplanar reconstructed images and MIPs were obtained and reviewed to evaluate the vascular anatomy. CONTRAST:  100 cc Isovue 370 COMPARISON:  None. FINDINGS: CTA CHEST FINDINGS Cardiovascular: No thoracic aortic aneurysm or dissection. No evidence of intramural hematoma. Heart size is normal. No pericardial effusion. Scattered coronary artery calcifications. No pulmonary embolism identified within the main or central lobar pulmonary arteries. Mediastinum/Nodes: Esophagus appears normal. No mass or enlarged lymph nodes within the mediastinum or perihilar regions. 1.9 cm hypodense lesion within the left thyroid lobe. Trachea and central bronchi are unremarkable. Lungs/Pleura: Lungs are clear.  No pleural effusion or pneumothorax. Musculoskeletal: Mild degenerative change in the lower thoracic spine. No acute or suspicious osseous finding.  Review of the MIP images confirms the above findings. CTA ABDOMEN AND PELVIS FINDINGS VASCULAR Aorta: Normal caliber aorta without aneurysm, dissection, vasculitis or significant stenosis. Celiac: Patent without evidence of aneurysm, dissection, vasculitis or significant stenosis. SMA: Patent without evidence of aneurysm, dissection, vasculitis or significant stenosis. Renals: Both renal arteries are patent without evidence of aneurysm, dissection, vasculitis, fibromuscular dysplasia or significant stenosis. IMA: Patent without evidence of aneurysm, dissection, vasculitis or significant stenosis. Inflow: Patent without evidence of aneurysm, dissection, vasculitis or significant stenosis. Veins: No obvious venous abnormality within the limitations of this arterial phase study. Review of the MIP images confirms the above findings. NON-VASCULAR Hepatobiliary: No focal liver abnormality is seen. No gallstones, gallbladder wall thickening, or biliary dilatation. Pancreas: Unremarkable. No pancreatic ductal dilatation or surrounding inflammatory changes. Spleen: Normal in size without focal abnormality. Adrenals/Urinary Tract: Adrenal glands appear normal. Kidneys are unremarkable without mass, stone or hydronephrosis. No perinephric fluid. No ureteral or bladder calculi identified. Bladder is unremarkable, partially decompressed. Stomach/Bowel: Bowel is normal in caliber. No bowel wall thickening or evidence of bowel wall inflammation seen. Appendix is normal. Stomach is unremarkable. Lymphatic: No enlarged lymph nodes seen. Reproductive: Prostate gland is enlarged causing some mass effect on the bladder base. Other: No free fluid or abscess collection. No free intraperitoneal air. Musculoskeletal: Degenerative disc desiccations throughout the lumbar spine, at least moderate in degree. No acute or suspicious osseous finding. Review of the MIP images confirms the above findings. IMPRESSION: 1. Overall, no acute findings.  No thoracic aortic aneurysm or dissection. No abdominal aortic aneurysm or dissection. Lungs are clear. No acute intra-abdominal or intrapelvic abnormality. 2. Coronary artery calcifications. Recommend correlation with any possible associated cardiac symptoms. Heart size is normal. No pericardial effusion. 3. 1.9 cm hypodense lesion within the left thyroid lobe. Per consensus guidelines, recommend nonemergent thyroid ultrasound for further characterization. 4. Prostate gland is at least moderately enlarged, causing mass effect on the bladder base. Consider correlation with PSA lab values. 5. 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 Milder degenerative change within the thoracic spine. No acute appearing osseous abnormality Electronically Signed   By: Bary RichardStan  Maynard M.D.   On: 02/27/2017 14:48   Ct Angio Abd/pel W/ And/or W/o  Result Date: 02/27/2017 CLINICAL DATA:  Chest pain since early this morning, elevated troponin level. History of hypertension, diabetes. EXAM: CT ANGIOGRAPHY CHEST, ABDOMEN AND PELVIS TECHNIQUE: Multidetector CT imaging through the chest, abdomen and pelvis was performed using the standard protocol during bolus administration of intravenous contrast. Multiplanar reconstructed images and MIPs were obtained and reviewed to evaluate the vascular anatomy. CONTRAST:  100 cc Isovue 370 COMPARISON:  None. FINDINGS: CTA CHEST FINDINGS Cardiovascular: No thoracic aortic aneurysm or dissection. No evidence of intramural hematoma. Heart size is normal. No pericardial effusion.  Scattered coronary artery calcifications. No pulmonary embolism identified within the main or central lobar pulmonary arteries. Mediastinum/Nodes: Esophagus appears normal. No mass or enlarged lymph nodes within the mediastinum or perihilar regions. 1.9 cm hypodense lesion within the left thyroid lobe. Trachea and central bronchi are unremarkable. Lungs/Pleura: Lungs are clear.  No pleural effusion or  pneumothorax. Musculoskeletal: Mild degenerative change in the lower thoracic spine. No acute or suspicious osseous finding. Review of the MIP images confirms the above findings. CTA ABDOMEN AND PELVIS FINDINGS VASCULAR Aorta: Normal caliber aorta without aneurysm, dissection, vasculitis or significant stenosis. Celiac: Patent without evidence of aneurysm, dissection, vasculitis or significant stenosis. SMA: Patent without evidence of aneurysm, dissection, vasculitis or significant stenosis. Renals: Both renal arteries are patent without evidence of aneurysm, dissection, vasculitis, fibromuscular dysplasia or significant stenosis. IMA: Patent without evidence of aneurysm, dissection, vasculitis or significant stenosis. Inflow: Patent without evidence of aneurysm, dissection, vasculitis or significant stenosis. Veins: No obvious venous abnormality within the limitations of this arterial phase study. Review of the MIP images confirms the above findings. NON-VASCULAR Hepatobiliary: No focal liver abnormality is seen. No gallstones, gallbladder wall thickening, or biliary dilatation. Pancreas: Unremarkable. No pancreatic ductal dilatation or surrounding inflammatory changes. Spleen: Normal in size without focal abnormality. Adrenals/Urinary Tract: Adrenal glands appear normal. Kidneys are unremarkable without mass, stone or hydronephrosis. No perinephric fluid. No ureteral or bladder calculi identified. Bladder is unremarkable, partially decompressed. Stomach/Bowel: Bowel is normal in caliber. No bowel wall thickening or evidence of bowel wall inflammation seen. Appendix is normal. Stomach is unremarkable. Lymphatic: No enlarged lymph nodes seen. Reproductive: Prostate gland is enlarged causing some mass effect on the bladder base. Other: No free fluid or abscess collection. No free intraperitoneal air. Musculoskeletal: Degenerative disc desiccations throughout the lumbar spine, at least moderate in degree. No acute or  suspicious osseous finding. Review of the MIP images confirms the above findings. IMPRESSION: 1. Overall, no acute findings. No thoracic aortic aneurysm or dissection. No abdominal aortic aneurysm or dissection. Lungs are clear. No acute intra-abdominal or intrapelvic abnormality. 2. Coronary artery calcifications. Recommend correlation with any possible associated cardiac symptoms. Heart size is normal. No pericardial effusion. 3. 1.9 cm hypodense lesion within the left thyroid lobe. Per consensus guidelines, recommend nonemergent thyroid ultrasound for further characterization. 4. Prostate gland is at least moderately enlarged, causing mass effect on the bladder base. Consider correlation with PSA lab values. 5. 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 Milder degenerative change within the thoracic spine. No acute appearing osseous abnormality Electronically Signed   By: Bary RichardStan  Maynard M.D.   On: 02/27/2017 14:48    Patient Profile     66 year old male with past medical history of diabetes mellitus, hypertension and hyperlipidemia admitted with non-ST elevation myocardial infarction.  Assessment & Plan    1 NSTEMI-patient presented with concerning symptoms. Troponin mildly elevated. Plan is for cardiac catheterization today. The risks and benefits including myocardial infarction, CVA and death discussed and he agrees to proceed. Continue aspirin, heparin, beta blocker and statin.  2 Hypertension-blood pressure is controlled. Continue present medications. Hold HCTZ today prior to catheterization.  3 Thyroid nodule-will need outpt thyroid ultrasound.  4 Enlarged prostate-FU primary care following DC.  5 DM-follow CBGs. Hold Glucophage for 48 hours following catheterization.  6 Hyperlipidemia-given ACS will increase lipitor to 80 mg daily.  For questions or updates, please contact [facility] Please consult [email or web address] for contact info under Cardiology/STEMI.       Signed, Olga MillersBrian Donia Yokum, MD  02/28/2017, 9:36 AM

## 2017-02-28 NOTE — Discharge Summary (Signed)
Discharge Summary    Patient ID: Tyler Ellis,  MRN: [id number], DOB/AGE: [id number] y.o.  Admit date: 02/27/2017 Discharge date: 02/28/2017   Primary Care Provider: Forrest Moronuehle, Stephen Primary Cardiologist: Dr. Purvis SheffieldKoneswaran  Discharge Diagnoses    Active Problems:   NSTEMI (non-ST elevated myocardial infarction) [facility](HCC)   Allergies No Known Allergies   History of Present Illness     66 year old male with past medical history of hypertension, hyperlipidemia, insulin-dependent diabetes who presented to [facility] with chest pain.  Tyler Ellis is a 66 year old male with past medical history of hypertension, hyperlipidemia and insulin-dependent diabetes.  He denies any significant family history of CAD, but reports that his sister had a "light heart attack "several years ago.  States he has been on insulin for about a year, but has had diabetes for over 5 years.  Denies ever having seen a cardiologist in the past.  Over the past several weeks he has had left arm numbness, and seen his PCP for this.  States this morning at 3 AM he was awoken from sleep with centralized chest pressure, that brought him to tears.  States he woke up and attempted to place ice to his chest, and walk around to relieve the pain.  Pressure continued, and he eventually presented [facility] with his symptoms, after speaking with his PCP over the phone.  In the ED his labs showed stable electrolytes, creatinine 0.96, hemoglobin 16.8, troponin 0.18.  CT angios chest was negative for acute findings.  EKG showed sinus tachycardia with no acute ST/T wave abnormalities.  His case was discussed with cardiology, and he was started on IV heparin.  [facility].  Hospital Course     Consultants: none  Pt underwent heart catheterization with nonobstructive disease.  Given his comorbidities, he was started on 81 mg ASA and lipitor was increased to 80 mg.   HTN No  medication changes from home meds listed.  Thyroid nodule Incidental finding on CT. Will need an outpatient thyroid ultrasound. Follow up with PCP.  Enlarged prostate Follow-up primary care following discharge  DM Resume metformin in 48 hours  Hyperlipidemia Increase Lipitor to 80 mg daily. Will need repeat LFTs  Patient seen and examined by Dr. Jens Somrenshaw today and was stable for discharge. All follow up has been arranged.  _____________  Discharge Vitals Blood pressure 113/83, pulse (!) 104, temperature 98.2 F (36.8 C), temperature source Oral, resp. rate 20, height 5\' 7"  (1.702 m), weight 225 lb (102.1 kg), SpO2 97 %.  Filed Weights   02/27/17 1127 02/27/17 1900  Weight: 230 lb 13.2 oz (104.7 kg) 225 lb (102.1 kg)    Labs & Radiologic Studies    CBC Recent Labs    02/27/17 1144  WBC 7.3  HGB 16.8  HCT 48.7  MCV 92.8  PLT 197   Basic Metabolic Panel Recent Labs    40/98/1109/11/07 1240 02/28/17 0625  NA 135 137  K 3.6 3.8  CL 100* 100*  CO2 25 23  GLUCOSE 165* 182*  BUN 18 16  CREATININE 0.96 1.01  CALCIUM 9.4 9.6   Liver Function Tests Recent Labs    02/27/17 1240  AST 24  ALT 33  ALKPHOS 85  BILITOT 0.9  PROT 7.3  ALBUMIN 4.5   Recent Labs    02/27/17 1240  LIPASE 33   Cardiac Enzymes Recent Labs    02/27/17 1929 02/28/17 0017 02/28/17 [id number]  TROPONINI 0.16* 0.10* 0.09*   BNP Invalid input(s): POCBNP D-Dimer No results for input(s): DDIMER in the last 72 hours. Hemoglobin A1C Recent Labs    02/28/17 0017  HGBA1C 7.8*   Fasting Lipid Panel Recent Labs    02/28/17 0017  CHOL 191  HDL 52  LDLCALC 81  TRIG 288*  CHOLHDL 3.7   Thyroid Function Tests No results for input(s): TSH, T4TOTAL, T3FREE, THYROIDAB in the last 72 hours.  Invalid input(s): FREET3 _____________  Dg Chest 2 View  Result Date: 02/27/2017 CLINICAL DATA:  Chest pain EXAM: CHEST  2 VIEW COMPARISON:  02/27/2017 FINDINGS: The heart size and mediastinal contours  are within normal limits. Both lungs are clear. The visualized skeletal structures are unremarkable. IMPRESSION: No active cardiopulmonary disease. Electronically Signed   By: Kennith Center M.D.   On: 02/27/2017 12:44   Ct Angio Chest Aorta W And/or Wo Contrast  Result Date: 02/27/2017 CLINICAL DATA:  Chest pain since early this morning, elevated troponin level. History of hypertension, diabetes. EXAM: CT ANGIOGRAPHY CHEST, ABDOMEN AND PELVIS TECHNIQUE: Multidetector CT imaging through the chest, abdomen and pelvis was performed using the standard protocol during bolus administration of intravenous contrast. Multiplanar reconstructed images and MIPs were obtained and reviewed to evaluate the vascular anatomy. CONTRAST:  100 cc Isovue 370 COMPARISON:  None. FINDINGS: CTA CHEST FINDINGS Cardiovascular: No thoracic aortic aneurysm or dissection. No evidence of intramural hematoma. Heart size is normal. No pericardial effusion. Scattered coronary artery calcifications. No pulmonary embolism identified within the main or central lobar pulmonary arteries. Mediastinum/Nodes: Esophagus appears normal. No mass or enlarged lymph nodes within the mediastinum or perihilar regions. 1.9 cm hypodense lesion within the left thyroid lobe. Trachea and central bronchi are unremarkable. Lungs/Pleura: Lungs are clear.  No pleural effusion or pneumothorax. Musculoskeletal: Mild degenerative change in the lower thoracic spine. No acute or suspicious osseous finding. Review of the MIP images confirms the above findings. CTA ABDOMEN AND PELVIS FINDINGS VASCULAR Aorta: Normal caliber aorta without aneurysm, dissection, vasculitis or significant stenosis. Celiac: Patent without evidence of aneurysm, dissection, vasculitis or significant stenosis. SMA: Patent without evidence of aneurysm, dissection, vasculitis or significant stenosis. Renals: Both renal arteries are patent without evidence of aneurysm, dissection, vasculitis,  fibromuscular dysplasia or significant stenosis. IMA: Patent without evidence of aneurysm, dissection, vasculitis or significant stenosis. Inflow: Patent without evidence of aneurysm, dissection, vasculitis or significant stenosis. Veins: No obvious venous abnormality within the limitations of this arterial phase study. Review of the MIP images confirms the above findings. NON-VASCULAR Hepatobiliary: No focal liver abnormality is seen. No gallstones, gallbladder wall thickening, or biliary dilatation. Pancreas: Unremarkable. No pancreatic ductal dilatation or surrounding inflammatory changes. Spleen: Normal in size without focal abnormality. Adrenals/Urinary Tract: Adrenal glands appear normal. Kidneys are unremarkable without mass, stone or hydronephrosis. No perinephric fluid. No ureteral or bladder calculi identified. Bladder is unremarkable, partially decompressed. Stomach/Bowel: Bowel is normal in caliber. No bowel wall thickening or evidence of bowel wall inflammation seen. Appendix is normal. Stomach is unremarkable. Lymphatic: No enlarged lymph nodes seen. Reproductive: Prostate gland is enlarged causing some mass effect on the bladder base. Other: No free fluid or abscess collection. No free intraperitoneal air. Musculoskeletal: Degenerative disc desiccations throughout the lumbar spine, at least moderate in degree. No acute or suspicious osseous finding. Review of the MIP images confirms the above findings. IMPRESSION: 1. Overall, no acute findings. No thoracic aortic aneurysm or dissection. No abdominal aortic aneurysm or dissection. Lungs are clear. No acute intra-abdominal or  intrapelvic abnormality. 2. Coronary artery calcifications. Recommend correlation with any possible associated cardiac symptoms. Heart size is normal. No pericardial effusion. 3. 1.9 cm hypodense lesion within the left thyroid lobe. Per consensus guidelines, recommend nonemergent thyroid ultrasound for further characterization. 4.  Prostate gland is at least moderately enlarged, causing mass effect on the bladder base. Consider correlation with PSA lab values. 5. 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 Milder degenerative change within the thoracic spine. No acute appearing osseous abnormality Electronically Signed   By: Bary Richard M.D.   On: 02/27/2017 14:48   Ct Angio Abd/pel W/ And/or W/o  Result Date: 02/27/2017 CLINICAL DATA:  Chest pain since early this morning, elevated troponin level. History of hypertension, diabetes. EXAM: CT ANGIOGRAPHY CHEST, ABDOMEN AND PELVIS TECHNIQUE: Multidetector CT imaging through the chest, abdomen and pelvis was performed using the standard protocol during bolus administration of intravenous contrast. Multiplanar reconstructed images and MIPs were obtained and reviewed to evaluate the vascular anatomy. CONTRAST:  100 cc Isovue 370 COMPARISON:  None. FINDINGS: CTA CHEST FINDINGS Cardiovascular: No thoracic aortic aneurysm or dissection. No evidence of intramural hematoma. Heart size is normal. No pericardial effusion. Scattered coronary artery calcifications. No pulmonary embolism identified within the main or central lobar pulmonary arteries. Mediastinum/Nodes: Esophagus appears normal. No mass or enlarged lymph nodes within the mediastinum or perihilar regions. 1.9 cm hypodense lesion within the left thyroid lobe. Trachea and central bronchi are unremarkable. Lungs/Pleura: Lungs are clear.  No pleural effusion or pneumothorax. Musculoskeletal: Mild degenerative change in the lower thoracic spine. No acute or suspicious osseous finding. Review of the MIP images confirms the above findings. CTA ABDOMEN AND PELVIS FINDINGS VASCULAR Aorta: Normal caliber aorta without aneurysm, dissection, vasculitis or significant stenosis. Celiac: Patent without evidence of aneurysm, dissection, vasculitis or significant stenosis. SMA: Patent without evidence of aneurysm, dissection,  vasculitis or significant stenosis. Renals: Both renal arteries are patent without evidence of aneurysm, dissection, vasculitis, fibromuscular dysplasia or significant stenosis. IMA: Patent without evidence of aneurysm, dissection, vasculitis or significant stenosis. Inflow: Patent without evidence of aneurysm, dissection, vasculitis or significant stenosis. Veins: No obvious venous abnormality within the limitations of this arterial phase study. Review of the MIP images confirms the above findings. NON-VASCULAR Hepatobiliary: No focal liver abnormality is seen. No gallstones, gallbladder wall thickening, or biliary dilatation. Pancreas: Unremarkable. No pancreatic ductal dilatation or surrounding inflammatory changes. Spleen: Normal in size without focal abnormality. Adrenals/Urinary Tract: Adrenal glands appear normal. Kidneys are unremarkable without mass, stone or hydronephrosis. No perinephric fluid. No ureteral or bladder calculi identified. Bladder is unremarkable, partially decompressed. Stomach/Bowel: Bowel is normal in caliber. No bowel wall thickening or evidence of bowel wall inflammation seen. Appendix is normal. Stomach is unremarkable. Lymphatic: No enlarged lymph nodes seen. Reproductive: Prostate gland is enlarged causing some mass effect on the bladder base. Other: No free fluid or abscess collection. No free intraperitoneal air. Musculoskeletal: Degenerative disc desiccations throughout the lumbar spine, at least moderate in degree. No acute or suspicious osseous finding. Review of the MIP images confirms the above findings. IMPRESSION: 1. Overall, no acute findings. No thoracic aortic aneurysm or dissection. No abdominal aortic aneurysm or dissection. Lungs are clear. No acute intra-abdominal or intrapelvic abnormality. 2. Coronary artery calcifications. Recommend correlation with any possible associated cardiac symptoms. Heart size is normal. No pericardial effusion. 3. 1.9 cm hypodense lesion  within the left thyroid lobe. Per consensus guidelines, recommend nonemergent thyroid ultrasound for further characterization. 4. Prostate gland is at least moderately enlarged,  causing mass effect on the bladder base. Consider correlation with PSA lab values. 5. 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 Milder degenerative change within the thoracic spine. No acute appearing osseous abnormality Electronically Signed   By: Bary RichardStan  Maynard M.D.   On: 02/27/2017 14:48     Diagnostic Studies/Procedures    Left heart cath 02/28/17:  Prox RCA lesion is 10% stenosed.  Mid RCA lesion is 10% stenosed.  The left ventricular systolic function is normal.  LV end diastolic pressure is normal.  The left ventricular ejection fraction is greater than 65% by visual estimate.  There is mild (2+) mitral regurgitation.   1. Mild non-obstructive CAD 2. Normal LV systolic function with normal filling pressures  Recommendations: No further ischemic evaluation.    Disposition   Pt is being discharged home today in good condition.  Follow-up Plans & Appointments   Office will call with appt, message sent.  Discharge Instructions    Diet - low sodium heart healthy   Complete by:  As directed    Discharge instructions   Complete by:  As directed    No driving for 2. No lifting over 5 lbs for 1 week. No sexual activity for 1 week. Keep procedure site clean & dry. If you notice increased pain, swelling, bleeding or pus, call/return!  You may shower, but no soaking baths/hot tubs/pools for 1 week.   Increase activity slowly   Complete by:  As directed       Discharge Medications   Current Discharge Medication List    START taking these medications   Details  aspirin EC 81 MG EC tablet Take 1 tablet (81 mg total) daily by mouth. Qty: 30 tablet, Refills: 11      CONTINUE these medications which have CHANGED   Details  atorvastatin (LIPITOR) 80 MG tablet Take 1  tablet (80 mg total) daily by mouth. Qty: 30 tablet, Refills: 3    metFORMIN (GLUCOPHAGE) 500 MG tablet Take 1 tablet (500 mg total) 2 (two) times daily with a meal by mouth. Resume on 03/03/17.      CONTINUE these medications which have NOT CHANGED   Details  acetaminophen-codeine (TYLENOL #3) 300-30 MG tablet Take 1 tablet every 8 (eight) hours as needed by mouth for moderate pain.     amLODipine (NORVASC) 5 MG tablet Take 5 mg daily by mouth.    diazepam (VALIUM) 5 MG tablet Take 2.5-5 mg 3 (three) times daily as needed by mouth for anxiety.     glipiZIDE (GLUCOTROL) 10 MG tablet Take 10 mg 2 (two) times daily before a meal by mouth.     hydrochlorothiazide (HYDRODIURIL) 25 MG tablet Take 25 mg daily by mouth.    lisinopril (PRINIVIL,ZESTRIL) 40 MG tablet Take 40 mg daily by mouth.    metoprolol succinate (TOPROL-XL) 100 MG 24 hr tablet Take 100 mg daily by mouth. Take with or immediately following a meal.          Outstanding Labs/Studies   Follow up with Dr. Purvis SheffieldKoneswaran  LFTs following increase in lipitor  Duration of Discharge Encounter   Greater than 30 minutes including physician time.  Signed, Roe Rutherfordngela Nicole Bev Drennen PA-C 02/28/2017, 5:07 PM

## 2017-02-28 NOTE — Progress Notes (Signed)
ANTICOAGULATION CONSULT NOTE - Follow Up Consult  Pharmacy Consult for heparin Indication: NSTEMI  Labs: Recent Labs    02/27/17 1144 02/27/17 1240 02/27/17 1929 02/28/17 0017  HGB 16.8  --   --   --   HCT 48.7  --   --   --   PLT 197  --   --   --   HEPARINUNFRC  --   --   --  0.54  CREATININE  --  0.96  --   --   TROPONINI 0.18*  --  0.16*  --     Assessment/Plan:  66yo male therapeutic on heparin with initial dosing for NSTEMI. Will continue gtt at current rate and confirm stable with additional level.   Vernard GamblesVeronda Donnalee Cellucci, PharmD, BCPS  02/28/2017,1:42 AM

## 2017-02-28 NOTE — Progress Notes (Signed)
Inpatient Diabetes Program Recommendations  AACE/ADA: New Consensus Statement on Inpatient Glycemic Control (2015)  Target Ranges:  Prepandial:   less than 140 mg/dL      Peak postprandial:   less than 180 mg/dL (1-2 hours)      Critically ill patients:  140 - 180 mg/dL   Results for Tyler MiresHARRIS, Tyler (MRN [id number]) as of 02/28/2017 12:07  Ref. Range 02/27/2017 18:41 02/27/2017 21:27 02/28/2017 00:33 02/28/2017 07:42  Glucose-Capillary Latest Ref Range: 65 - 99 mg/dL 829140 (H) 562245 (H) 130219 (H) 189 (H)   Review of Glycemic Control  Diabetes history: DM 2 Outpatient Diabetes medications: Metformin 500 mg BID, Glipizide 10 mg BID Current orders for Inpatient glycemic control: None  Inpatient Diabetes Program Recommendations:    Patient on 2 oral agents for DM at home. Glucose trend elevated. Please consider Novolog Moderate Correction scale 0-15 units tid + HS scale while inpatient.  Thanks,  Christena DeemShannon Raychel Dowler RN, MSN, [facility] Inpatient Diabetes Coordinator Team Pager [phone] (8a-5p)

## 2017-02-28 NOTE — H&P (View-Only) (Signed)
 Progress Note  Patient Name: Case Omahoney Date of Encounter: 02/28/2017  Primary Cardiologist: Dr Koneswaran  Subjective   No chest pain or dyspnea  Inpatient Medications    Scheduled Meds: . amLODipine  5 mg Oral Daily  . aspirin EC  81 mg Oral Daily  . atorvastatin  20 mg Oral q1800  . hydrochlorothiazide  25 mg Oral Daily  . lisinopril  40 mg Oral Daily  . metoprolol succinate  100 mg Oral Daily  . sodium chloride flush  3 mL Intravenous Q12H   Continuous Infusions: . sodium chloride    . sodium chloride 1 mL/kg/hr (02/28/17 0835)  . heparin 1,700 Units/hr (02/28/17 0633)   PRN Meds: sodium chloride, acetaminophen, nitroGLYCERIN, ondansetron (ZOFRAN) IV, sodium chloride flush   Vital Signs    Vitals:   02/28/17 0330 02/28/17 0626 02/28/17 0800 02/28/17 0801  BP: (!) 101/53 (!) 126/93 127/81 127/81  Pulse: 85 89 98   Resp: (!) 24 (!) 23 17   Temp:  98 F (36.7 C) 98.1 F (36.7 C) (!) 97.5 F (36.4 C)  TempSrc:  Oral Oral Oral  SpO2: 95% 95% 95% 94%  Weight:      Height:        Intake/Output Summary (Last 24 hours) at 02/28/2017 0936 Last data filed at 02/28/2017 0633 Gross per 24 hour  Intake 293 ml  Output 600 ml  Net -307 ml   Filed Weights   02/27/17 1127 02/27/17 1900  Weight: 230 lb 13.2 oz (104.7 kg) 225 lb (102.1 kg)    Telemetry    Sinus - Personally Reviewed   Physical Exam   GEN: No acute distress.   Neck: No JVD Cardiac: RRR, no murmurs, rubs, or gallops.  Respiratory: Clear to auscultation bilaterally. GI: Soft, nontender, non-distended  MS: No edema Neuro:  Nonfocal  Psych: Normal affect   Labs    Chemistry Recent Labs  Lab 02/27/17 1240 02/28/17 0625  NA 135 137  K 3.6 3.8  CL 100* 100*  CO2 25 23  GLUCOSE 165* 182*  BUN 18 16  CREATININE 0.96 1.01  CALCIUM 9.4 9.6  PROT 7.3  --   ALBUMIN 4.5  --   AST 24  --   ALT 33  --   ALKPHOS 85  --   BILITOT 0.9  --   GFRNONAA >60 >60  GFRAA >60 >60  ANIONGAP 10  14     Hematology Recent Labs  Lab 02/27/17 1144  WBC 7.3  RBC 5.25  HGB 16.8  HCT 48.7  MCV 92.8  MCH 32.0  MCHC 34.5  RDW 13.4  PLT 197    Cardiac Enzymes Recent Labs  Lab 02/27/17 1144 02/27/17 1929 02/28/17 0017 02/28/17 0625  TROPONINI 0.18* 0.16* 0.10* 0.09*    Radiology    Dg Chest 2 View  Result Date: 02/27/2017 CLINICAL DATA:  Chest pain EXAM: CHEST  2 VIEW COMPARISON:  02/27/2017 FINDINGS: The heart size and mediastinal contours are within normal limits. Both lungs are clear. The visualized skeletal structures are unremarkable. IMPRESSION: No active cardiopulmonary disease. Electronically Signed   By: Eric  Mansell M.D.   On: 02/27/2017 12:44   Ct Angio Chest Aorta W And/or Wo Contrast  Result Date: 02/27/2017 CLINICAL DATA:  Chest pain since early this morning, elevated troponin level. History of hypertension, diabetes. EXAM: CT ANGIOGRAPHY CHEST, ABDOMEN AND PELVIS TECHNIQUE: Multidetector CT imaging through the chest, abdomen and pelvis was performed using the standard protocol during bolus   administration of intravenous contrast. Multiplanar reconstructed images and MIPs were obtained and reviewed to evaluate the vascular anatomy. CONTRAST:  100 cc Isovue 370 COMPARISON:  None. FINDINGS: CTA CHEST FINDINGS Cardiovascular: No thoracic aortic aneurysm or dissection. No evidence of intramural hematoma. Heart size is normal. No pericardial effusion. Scattered coronary artery calcifications. No pulmonary embolism identified within the main or central lobar pulmonary arteries. Mediastinum/Nodes: Esophagus appears normal. No mass or enlarged lymph nodes within the mediastinum or perihilar regions. 1.9 cm hypodense lesion within the left thyroid lobe. Trachea and central bronchi are unremarkable. Lungs/Pleura: Lungs are clear.  No pleural effusion or pneumothorax. Musculoskeletal: Mild degenerative change in the lower thoracic spine. No acute or suspicious osseous finding.  Review of the MIP images confirms the above findings. CTA ABDOMEN AND PELVIS FINDINGS VASCULAR Aorta: Normal caliber aorta without aneurysm, dissection, vasculitis or significant stenosis. Celiac: Patent without evidence of aneurysm, dissection, vasculitis or significant stenosis. SMA: Patent without evidence of aneurysm, dissection, vasculitis or significant stenosis. Renals: Both renal arteries are patent without evidence of aneurysm, dissection, vasculitis, fibromuscular dysplasia or significant stenosis. IMA: Patent without evidence of aneurysm, dissection, vasculitis or significant stenosis. Inflow: Patent without evidence of aneurysm, dissection, vasculitis or significant stenosis. Veins: No obvious venous abnormality within the limitations of this arterial phase study. Review of the MIP images confirms the above findings. NON-VASCULAR Hepatobiliary: No focal liver abnormality is seen. No gallstones, gallbladder wall thickening, or biliary dilatation. Pancreas: Unremarkable. No pancreatic ductal dilatation or surrounding inflammatory changes. Spleen: Normal in size without focal abnormality. Adrenals/Urinary Tract: Adrenal glands appear normal. Kidneys are unremarkable without mass, stone or hydronephrosis. No perinephric fluid. No ureteral or bladder calculi identified. Bladder is unremarkable, partially decompressed. Stomach/Bowel: Bowel is normal in caliber. No bowel wall thickening or evidence of bowel wall inflammation seen. Appendix is normal. Stomach is unremarkable. Lymphatic: No enlarged lymph nodes seen. Reproductive: Prostate gland is enlarged causing some mass effect on the bladder base. Other: No free fluid or abscess collection. No free intraperitoneal air. Musculoskeletal: Degenerative disc desiccations throughout the lumbar spine, at least moderate in degree. No acute or suspicious osseous finding. Review of the MIP images confirms the above findings. IMPRESSION: 1. Overall, no acute findings.  No thoracic aortic aneurysm or dissection. No abdominal aortic aneurysm or dissection. Lungs are clear. No acute intra-abdominal or intrapelvic abnormality. 2. Coronary artery calcifications. Recommend correlation with any possible associated cardiac symptoms. Heart size is normal. No pericardial effusion. 3. 1.9 cm hypodense lesion within the left thyroid lobe. Per consensus guidelines, recommend nonemergent thyroid ultrasound for further characterization. 4. Prostate gland is at least moderately enlarged, causing mass effect on the bladder base. Consider correlation with PSA lab values. 5. 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 Milder degenerative change within the thoracic spine. No acute appearing osseous abnormality Electronically Signed   By: Stan  Maynard M.D.   On: 02/27/2017 14:48   Ct Angio Abd/pel W/ And/or W/o  Result Date: 02/27/2017 CLINICAL DATA:  Chest pain since early this morning, elevated troponin level. History of hypertension, diabetes. EXAM: CT ANGIOGRAPHY CHEST, ABDOMEN AND PELVIS TECHNIQUE: Multidetector CT imaging through the chest, abdomen and pelvis was performed using the standard protocol during bolus administration of intravenous contrast. Multiplanar reconstructed images and MIPs were obtained and reviewed to evaluate the vascular anatomy. CONTRAST:  100 cc Isovue 370 COMPARISON:  None. FINDINGS: CTA CHEST FINDINGS Cardiovascular: No thoracic aortic aneurysm or dissection. No evidence of intramural hematoma. Heart size is normal. No pericardial effusion.   Scattered coronary artery calcifications. No pulmonary embolism identified within the main or central lobar pulmonary arteries. Mediastinum/Nodes: Esophagus appears normal. No mass or enlarged lymph nodes within the mediastinum or perihilar regions. 1.9 cm hypodense lesion within the left thyroid lobe. Trachea and central bronchi are unremarkable. Lungs/Pleura: Lungs are clear.  No pleural effusion or  pneumothorax. Musculoskeletal: Mild degenerative change in the lower thoracic spine. No acute or suspicious osseous finding. Review of the MIP images confirms the above findings. CTA ABDOMEN AND PELVIS FINDINGS VASCULAR Aorta: Normal caliber aorta without aneurysm, dissection, vasculitis or significant stenosis. Celiac: Patent without evidence of aneurysm, dissection, vasculitis or significant stenosis. SMA: Patent without evidence of aneurysm, dissection, vasculitis or significant stenosis. Renals: Both renal arteries are patent without evidence of aneurysm, dissection, vasculitis, fibromuscular dysplasia or significant stenosis. IMA: Patent without evidence of aneurysm, dissection, vasculitis or significant stenosis. Inflow: Patent without evidence of aneurysm, dissection, vasculitis or significant stenosis. Veins: No obvious venous abnormality within the limitations of this arterial phase study. Review of the MIP images confirms the above findings. NON-VASCULAR Hepatobiliary: No focal liver abnormality is seen. No gallstones, gallbladder wall thickening, or biliary dilatation. Pancreas: Unremarkable. No pancreatic ductal dilatation or surrounding inflammatory changes. Spleen: Normal in size without focal abnormality. Adrenals/Urinary Tract: Adrenal glands appear normal. Kidneys are unremarkable without mass, stone or hydronephrosis. No perinephric fluid. No ureteral or bladder calculi identified. Bladder is unremarkable, partially decompressed. Stomach/Bowel: Bowel is normal in caliber. No bowel wall thickening or evidence of bowel wall inflammation seen. Appendix is normal. Stomach is unremarkable. Lymphatic: No enlarged lymph nodes seen. Reproductive: Prostate gland is enlarged causing some mass effect on the bladder base. Other: No free fluid or abscess collection. No free intraperitoneal air. Musculoskeletal: Degenerative disc desiccations throughout the lumbar spine, at least moderate in degree. No acute or  suspicious osseous finding. Review of the MIP images confirms the above findings. IMPRESSION: 1. Overall, no acute findings. No thoracic aortic aneurysm or dissection. No abdominal aortic aneurysm or dissection. Lungs are clear. No acute intra-abdominal or intrapelvic abnormality. 2. Coronary artery calcifications. Recommend correlation with any possible associated cardiac symptoms. Heart size is normal. No pericardial effusion. 3. 1.9 cm hypodense lesion within the left thyroid lobe. Per consensus guidelines, recommend nonemergent thyroid ultrasound for further characterization. 4. Prostate gland is at least moderately enlarged, causing mass effect on the bladder base. Consider correlation with PSA lab values. 5. 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 Milder degenerative change within the thoracic spine. No acute appearing osseous abnormality Electronically Signed   By: Stan  Maynard M.D.   On: 02/27/2017 14:48    Patient Profile     66-year-old male with past medical history of diabetes mellitus, hypertension and hyperlipidemia admitted with non-ST elevation myocardial infarction.  Assessment & Plan    1 NSTEMI-patient presented with concerning symptoms. Troponin mildly elevated. Plan is for cardiac catheterization today. The risks and benefits including myocardial infarction, CVA and death discussed and he agrees to proceed. Continue aspirin, heparin, beta blocker and statin.  2 Hypertension-blood pressure is controlled. Continue present medications. Hold HCTZ today prior to catheterization.  3 Thyroid nodule-will need outpt thyroid ultrasound.  4 Enlarged prostate-FU primary care following DC.  5 DM-follow CBGs. Hold Glucophage for 48 hours following catheterization.  6 Hyperlipidemia-given ACS will increase lipitor to 80 mg daily.  For questions or updates, please contact [facility] Please consult [email or web address] for contact info under Cardiology/STEMI.       Signed, Aerica Rincon, MD    02/28/2017, 9:36 AM    

## 2017-02-28 NOTE — Discharge Instructions (Signed)
Radial Site Care °Refer to this sheet in the next few weeks. These instructions provide you with information about caring for yourself after your procedure. Your health care provider may also give you more specific instructions. Your treatment has been planned according to current medical practices, but problems sometimes occur. Call your health care provider if you have any problems or questions after your procedure. °What can I expect after the procedure? °After your procedure, it is typical to have the following: °· Bruising at the radial site that usually fades within 1-2 weeks. °· Blood collecting in the tissue (hematoma) that may be painful to the touch. It should usually decrease in size and tenderness within 1-2 weeks. ° °Follow these instructions at home: °· Take medicines only as directed by your health care provider. °· You may shower 24-48 hours after the procedure or as directed by your health care provider. Remove the bandage (dressing) and gently wash the site with plain soap and water. Pat the area dry with a clean towel. Do not rub the site, because this may cause bleeding. °· Do not take baths, swim, or use a hot tub until your health care provider approves. °· Check your insertion site every day for redness, swelling, or drainage. °· Do not apply powder or lotion to the site. °· Do not flex or bend the affected arm for 24 hours or as directed by your health care provider. °· Do not push or pull heavy objects with the affected arm for 24 hours or as directed by your health care provider. °· Do not lift over 10 lb (4.5 kg) for 5 days after your procedure or as directed by your health care provider. °· Ask your health care provider when it is okay to: °? Return to work or school. °? Resume usual physical activities or sports. °? Resume sexual activity. °· Do not drive home if you are discharged the same day as the procedure. Have someone else drive you. °· You may drive 24 hours after the procedure  unless otherwise instructed by your health care provider. °· Do not operate machinery or power tools for 24 hours after the procedure. °· If your procedure was done as an outpatient procedure, which means that you went home the same day as your procedure, a responsible adult should be with you for the first 24 hours after you arrive home. °· Keep all follow-up visits as directed by your health care provider. This is important. °Contact a health care provider if: °· You have a fever. °· You have chills. °· You have increased bleeding from the radial site. Hold pressure on the site. °Get help right away if: °· You have unusual pain at the radial site. °· You have redness, warmth, or swelling at the radial site. °· You have drainage (other than a small amount of blood on the dressing) from the radial site. °· The radial site is bleeding, and the bleeding does not stop after 30 minutes of holding steady pressure on the site. °· Your arm or hand becomes pale, cool, tingly, or numb. °This information is not intended to replace advice given to you by your health care provider. Make sure you discuss any questions you have with your health care provider. °Document Released: 05/12/2010 Document Revised: 09/15/2015 Document Reviewed: 10/26/2013 °Elsevier Interactive Patient Education © 2018 [employer] ° ° °Diabetes Mellitus and Food °It is important for you to manage your blood sugar (glucose) level. Your blood glucose level can be greatly affected   by what you eat. Eating healthier foods in the appropriate amounts throughout the day at about the same time each day will help you control your blood glucose level. It can also help slow or prevent worsening of your diabetes mellitus. Healthy eating may even help you improve the level of your blood pressure and reach or maintain a healthy weight. °General recommendations for healthful eating and cooking habits include: °· Eating meals and snacks regularly. Avoid going long  periods of time without eating to lose weight. °· Eating a diet that consists mainly of plant-based foods, such as fruits, vegetables, nuts, legumes, and whole grains. °· Using low-heat cooking methods, such as baking, instead of high-heat cooking methods, such as deep frying. ° °Work with your dietitian to make sure you understand how to use the Nutrition Facts information on food labels. °How can food affect me? °Carbohydrates °Carbohydrates affect your blood glucose level more than any other type of food. Your dietitian will help you determine how many carbohydrates to eat at each meal and teach you how to count carbohydrates. Counting carbohydrates is important to keep your blood glucose at a healthy level, especially if you are using insulin or taking certain medicines for diabetes mellitus. °Alcohol °Alcohol can cause sudden decreases in blood glucose (hypoglycemia), especially if you use insulin or take certain medicines for diabetes mellitus. Hypoglycemia can be a life-threatening condition. Symptoms of hypoglycemia (sleepiness, dizziness, and disorientation) are similar to symptoms of having too much alcohol. °If your health care provider has given you approval to drink alcohol, do so in moderation and use the following guidelines: °· Women should not have more than one drink per day, and men should not have more than two drinks per day. One drink is equal to: °? 12 oz of beer. °? 5 oz of wine. °? 1½ oz of hard liquor. °· Do not drink on an empty stomach. °· Keep yourself hydrated. Have water, diet soda, or unsweetened iced tea. °· Regular soda, juice, and other mixers might contain a lot of carbohydrates and should be counted. ° °What foods are not recommended? °As you make food choices, it is important to remember that all foods are not the same. Some foods have fewer nutrients per serving than other foods, even though they might have the same number of calories or carbohydrates. It is difficult to get  your body what it needs when you eat foods with fewer nutrients. Examples of foods that you should avoid that are high in calories and carbohydrates but low in nutrients include: °· Trans fats (most processed foods list trans fats on the Nutrition Facts label). °· Regular soda. °· Juice. °· Candy. °· Sweets, such as cake, pie, doughnuts, and cookies. °· Fried foods. ° °What foods can I eat? °Eat nutrient-rich foods, which will nourish your body and keep you healthy. The food you should eat also will depend on several factors, including: °· The calories you need. °· The medicines you take. °· Your weight. °· Your blood glucose level. °· Your blood pressure level. °· Your cholesterol level. ° °You should eat a variety of foods, including: °· Protein. °? Lean cuts of meat. °? Proteins low in saturated fats, such as fish, egg whites, and beans. Avoid processed meats. °· Fruits and vegetables. °? Fruits and vegetables that may help control blood glucose levels, such as apples, mangoes, and yams. °· Dairy products. °? Choose fat-free or low-fat dairy products, such as milk, yogurt, and cheese. °· Grains, bread, pasta,   and rice. °? Choose whole grain products, such as multigrain bread, whole oats, and brown rice. These foods may help control blood pressure. °· Fats. °? Foods containing healthful fats, such as nuts, avocado, olive oil, canola oil, and fish. ° °Does everyone with diabetes mellitus have the same meal plan? °Because every person with diabetes mellitus is different, there is not one meal plan that works for everyone. It is very important that you meet with a dietitian who will help you create a meal plan that is just right for you. °This information is not intended to replace advice given to you by your health care provider. Make sure you discuss any questions you have with your health care provider. °Document Released: 01/04/2005 Document Revised: 09/15/2015 Document Reviewed: 03/06/2013 °Elsevier Interactive  Patient Education © 2017 [employer]. ° °

## 2017-02-28 NOTE — Interval H&P Note (Signed)
History and Physical Interval Note:  02/28/2017 10:30 AM  Tyler Ellis  has presented today for cardiac cath with the diagnosis of NSTEMI. The various methods of treatment have been discussed with the patient and family. After consideration of risks, benefits and other options for treatment, the patient has consented to  Procedure(s): LEFT HEART CATH AND CORONARY ANGIOGRAPHY (N/A) as a surgical intervention .  The patient's history has been reviewed, patient examined, no change in status, stable for surgery.  I have reviewed the patient's chart and labs.  Questions were answered to the patient's satisfaction.    Cath Lab Visit (complete for each Cath Lab visit)  Clinical Evaluation Leading to the Procedure:   ACS: Yes.    Non-ACS:    Anginal Classification: CCS III  Anti-ischemic medical therapy: Maximal Therapy (2 or more classes of medications)  Non-Invasive Test Results: No non-invasive testing performed  Prior CABG: No previous CABG         Verne Carrowhristopher McAlhany

## 2017-03-01 ENCOUNTER — Encounter (HOSPITAL_COMMUNITY): Payer: Self-pay | Admitting: Cardiovascular Disease

## 2017-03-01 MED ORDER — MIDAZOLAM HCL 2 MG/2ML IJ SOLN
INTRAMUSCULAR | Status: DC | PRN
  Administered 2017-02-28: 1 mg via INTRAVENOUS

## 2017-03-01 MED ORDER — FENTANYL CITRATE (PF) 100 MCG/2ML IJ SOLN
INTRAMUSCULAR | Status: DC | PRN
  Administered 2017-02-28: 25 ug via INTRAVENOUS

## 2017-03-11 ENCOUNTER — Ambulatory Visit: Payer: Medicare Other | Admitting: Cardiology

## 2017-03-11 ENCOUNTER — Encounter: Payer: Self-pay | Admitting: Cardiology

## 2017-03-11 DIAGNOSIS — E118 Type 2 diabetes mellitus with unspecified complications: Secondary | ICD-10-CM | POA: Diagnosis not present

## 2017-03-11 DIAGNOSIS — E785 Hyperlipidemia, unspecified: Secondary | ICD-10-CM

## 2017-03-11 DIAGNOSIS — I10 Essential (primary) hypertension: Secondary | ICD-10-CM | POA: Diagnosis not present

## 2017-03-11 DIAGNOSIS — I2510 Atherosclerotic heart disease of native coronary artery without angina pectoris: Secondary | ICD-10-CM

## 2017-03-11 HISTORY — DX: Hyperlipidemia, unspecified: E78.5

## 2017-03-11 HISTORY — DX: Type 2 diabetes mellitus with unspecified complications: E11.8

## 2017-03-11 HISTORY — DX: Essential (primary) hypertension: I10

## 2017-03-11 MED ORDER — NITROGLYCERIN 0.4 MG SL SUBL
0.4000 mg | SUBLINGUAL_TABLET | SUBLINGUAL | 2 refills | Status: DC | PRN
Start: 2017-03-11 — End: 2017-03-11

## 2017-03-11 MED ORDER — NITROGLYCERIN 0.4 MG SL SUBL: 0.4000 mg | SUBLINGUAL_TABLET | SUBLINGUAL | 2 refills | Status: DC | PRN

## 2017-03-11 NOTE — Patient Instructions (Addendum)
Medication Instructions:  Your physician has recommended you make the following change in your medication:  1.) START Nitroglycerin tablets as needed for chest pain.  The proper use and anticipated side effects of nitroglycerine has been carefully explained.  If a single episode of chest pain is not relieved by one tablet, the patient will try another within 5 minutes; and if this doesn't relieve the pain, the patient is instructed to call 911 for transportation to an emergency department.  Labwork: None   Testing/Procedures: EKG today in office.   Follow-Up: Your physician recommends that you schedule a follow-up appointment in: 1 month   Any Other Special Instructions Will Be Listed Below (If Applicable).  Please note that any paperwork needing to be filled out by the provider will need to be addressed at the front desk prior to seeing the provider. Please note that any paperwork FMLA, Disability or other documents regarding health condition is subject to a $25.00 charge that must be received prior to completion of paperwork in the form of a money order or check.    If you need a refill on your cardiac medications before your next appointment, please call your pharmacy.

## 2017-03-11 NOTE — Addendum Note (Signed)
Addended by: Rodney LangtonITTER, Wiktoria Hemrick M on: 03/11/2017 09:24 AM   Modules accepted: Orders

## 2017-03-11 NOTE — Addendum Note (Signed)
Addended by: Rodney LangtonITTER, Jazier Mcglamery M on: 03/11/2017 09:28 AM   Modules accepted: Orders

## 2017-03-11 NOTE — Progress Notes (Signed)
Cardiology Office Note:    Date:  03/11/2017   ID:  Tyler Miresewey Cape, DOB 04-20-1951, MRN [id number]  PCP:  Forrest Moronuehle, Stephen, MD  Cardiologist:  Gypsy Balsamobert Alonia Dibuono, MD    Referring MD: Forrest Moronuehle, Stephen, MD   Chief Complaint  Patient presents with  . Hospitalization Follow-up  I had a heart attack  History of Present Illness:    Tyler Ellis is a 66 y.o. male with diabetes hypertension dyslipidemia one night he woke up in the middle of it with chest tightness and sweating went to urgent care after that [facility] did have minimal spell of enzymes, cardiac catheterization showed only luminal disease.  Recover completely and doing well right now.  Denies having exertional chest pain tightness squeezing pressure in chest.  We spent a great of time talking about what happened to him we talked about risk factors modification for future microinfarction I told him he did have a small heart attack but did not lead to significant damage to his heart.  He appears to be asymptomatic and now the goal should be to reduce his risk for coronary artery disease in the future.  Past Medical History:  Diagnosis Date  . Diabetes mellitus without complication (HCC)   . Hypertension     Past Surgical History:  Procedure Laterality Date  . KNEE SURGERY    . LEFT HEART CATH AND CORONARY ANGIOGRAPHY N/A 02/28/2017   Performed by Kathleene HazelMcAlhany, Christopher D, MD at [facility]    Current Medications: Current Meds  Medication Sig  . acetaminophen-codeine (TYLENOL #3) 300-30 MG tablet Take 1 tablet every 8 (eight) hours as needed by mouth for moderate pain.   Marland Kitchen. amLODipine (NORVASC) 5 MG tablet Take 5 mg daily by mouth.  Marland Kitchen. aspirin EC 81 MG EC tablet Take 1 tablet (81 mg total) daily by mouth.  Marland Kitchen. atorvastatin (LIPITOR) 80 MG tablet Take 1 tablet (80 mg total) daily by mouth.  . diazepam (VALIUM) 5 MG tablet Take 2.5-5 mg 3 (three) times daily as needed by mouth for anxiety.   Marland Kitchen. glipiZIDE  (GLUCOTROL) 10 MG tablet Take 10 mg 2 (two) times daily before a meal by mouth.   . hydrochlorothiazide (HYDRODIURIL) 25 MG tablet Take 25 mg daily by mouth.  Marland Kitchen. lisinopril (PRINIVIL,ZESTRIL) 40 MG tablet Take 40 mg daily by mouth.  . metFORMIN (GLUCOPHAGE) 500 MG tablet Take 1 tablet (500 mg total) 2 (two) times daily with a meal by mouth. Resume on 03/03/17.  Marland Kitchen. metoprolol succinate (TOPROL-XL) 100 MG 24 hr tablet Take 100 mg daily by mouth. Take with or immediately following a meal.     Allergies:   Patient has no known allergies.   Social History   Socioeconomic History  . Marital status: Divorced    Spouse name: None  . Number of children: None  . Years of education: None  . Highest education level: None  Social Needs  . Financial resource strain: None  . Food insecurity - worry: None  . Food insecurity - inability: None  . Transportation needs - medical: None  . Transportation needs - non-medical: None  Occupational History  . None  Tobacco Use  . Smoking status: Never Smoker  . Smokeless tobacco: Never Used  Substance and Sexual Activity  . Alcohol use: Yes    Comment: weekly  . Drug use: No  . Sexual activity: None  Other Topics Concern  . None  Social History Narrative  . None  Family History: The patient's family history includes Hypertension in his father and mother. ROS:   Please see the history of present illness.    All 14 point review of systems negative except as described per history of present illness  EKGs/Labs/Other Studies Reviewed:      Recent Labs: 02/27/2017: ALT 33; Hemoglobin 16.8; Platelets 197 02/28/2017: BUN 16; Creatinine, Ser 1.01; Potassium 3.8; Sodium 137  Recent Lipid Panel    Component Value Date/Time   CHOL 191 02/28/2017 0017   TRIG 288 (H) 02/28/2017 0017   HDL 52 02/28/2017 0017   CHOLHDL 3.7 02/28/2017 0017   VLDL 58 (H) 02/28/2017 0017   LDLCALC 81 02/28/2017 0017    Physical Exam:    VS:  BP 130/72   Pulse 68    Resp 16   Ht 5\' 7"  (1.702 m)   Wt 237 lb (107.5 kg)   BMI 37.12 kg/m     Wt Readings from Last 3 Encounters:  03/11/17 237 lb (107.5 kg)  02/27/17 225 lb (102.1 kg)     GEN:  Well nourished, well developed in no acute distress HEENT: Normal NECK: No JVD; No carotid bruits LYMPHATICS: No lymphadenopathy CARDIAC: RRR, no murmurs, no rubs, no gallops RESPIRATORY:  Clear to auscultation without rales, wheezing or rhonchi  ABDOMEN: Soft, non-tender, non-distended MUSCULOSKELETAL:  No edema; No deformity  SKIN: Warm and dry LOWER EXTREMITIES: no swelling NEUROLOGIC:  Alert and oriented x 3 PSYCHIATRIC:  Normal affect   ASSESSMENT:    1. Coronary artery disease involving native coronary artery of native heart without angina pectoris   2. Type 2 diabetes mellitus with complication, without long-term current use of insulin (HCC)   3. Dyslipidemia   4. Essential hypertension    PLAN:    In order of problems listed above:  1. Coronary artery disease: Only luminal by cardiac catheterization.  The key is risk factors modifications.  He is already on appropriate medication he is on ACE inhibitor, aspirin, high intensity statin.  I will check his fasting lipid profile in a month from today.  Talked about good diet as well as exercises on the regular basis I recommended 30 minutes of moderate intensity exercise 5 times a week.  Talk about good  heart healthy diet. 2. Diabetes mellitus: His hemoglobin A1c is 7.8 it is quite acceptable but still some room for improvement.  Hopefully starting exercised on the regular basis will bring this down. 3. Dyslipidemia: He is on high intensity statin which I will continue mild from today we will check his fasting lipid profile he did have high triglycerides which hopefully will improve with exercises and weight loss. 4. Essential hypertension: Blood pressure and appears to be well controlled continue present management.  Long discussion with the  patient explained to him what happened in the hospital I described to him the results of his cardiac catheterization.  He is doing well the key will be to modify his risk for future events.  I also told him that he is heart was not significantly damaged by small myocardial infarction that he suffered from.   Medication Adjustments/Labs and Tests Ordered: Current medicines are reviewed at length with the patient today.  Concerns regarding medicines are outlined above.  No orders of the defined types were placed in this encounter.  Medication changes: No orders of the defined types were placed in this encounter.   Signed, Tyler Leaobert J. Shirah Roseman, MD, [facility] 03/11/2017 9:08 AM    [facility]

## 2017-04-10 ENCOUNTER — Ambulatory Visit: Payer: Medicare Other | Admitting: Cardiology

## 2017-04-29 ENCOUNTER — Encounter: Payer: Self-pay | Admitting: Cardiology

## 2017-04-29 ENCOUNTER — Ambulatory Visit (INDEPENDENT_AMBULATORY_CARE_PROVIDER_SITE_OTHER): Payer: Medicare Other | Admitting: Cardiology

## 2017-04-29 VITALS — BP 140/82 | HR 113 | Ht 67.0 in | Wt 233.0 lb

## 2017-04-29 DIAGNOSIS — I2510 Atherosclerotic heart disease of native coronary artery without angina pectoris: Secondary | ICD-10-CM

## 2017-04-29 DIAGNOSIS — E118 Type 2 diabetes mellitus with unspecified complications: Secondary | ICD-10-CM | POA: Diagnosis not present

## 2017-04-29 NOTE — Progress Notes (Signed)
Cardiology Office Note:    Date:  04/29/2017   ID:  [name], DOB 1951/03/12, MRN [id number]  PCP:  Forrest Moron, MD  Cardiologist:  Gypsy Balsam, MD    Referring MD: Forrest Moron, MD   Chief Complaint  Patient presents with  . 1 month follow up  Doing well  History of Present Illness:    Tyler Ellis is a 67 y.o. male coronary artery disease.  Recently he ended up going to hospital because of chest pain.  He was diagnosed with non-STEMI, cardiac catheterization was done which showed luminal, nonobstructive disease.  Since that time he is doing well.  Last time we spoke we discussed risk factors modifications and he is very good in keeping up with that.  He exercise on stationary bike at home almost every single day for about half an hour.  He also try to walk but developed some problem with his food and that prevent him from walking as much as he should.  He is seriously contemplating joining a gym and I strongly recommended for him to do that.  He says his diabetes is fine.   Past Medical History:  Diagnosis Date  . Diabetes mellitus without complication (HCC)   . Hypertension     Past Surgical History:  Procedure Laterality Date  . KNEE SURGERY    . LEFT HEART CATH AND CORONARY ANGIOGRAPHY N/A 02/28/2017   Procedure: LEFT HEART CATH AND CORONARY ANGIOGRAPHY;  Surgeon: Kathleene Hazel, MD;  Location: [facility] IN[facility];  Service: Cardiovascular;  Laterality: N/A;    Current Medications: Current Meds  Medication Sig  . acetaminophen-codeine (TYLENOL #3) 300-30 MG tablet Take 1 tablet every 8 (eight) hours as needed by mouth for moderate pain.   Marland Kitchen amLODipine (NORVASC) 5 MG tablet Take 5 mg daily by mouth.  Marland Kitchen aspirin EC 81 MG EC tablet Take 1 tablet (81 mg total) daily by mouth.  Marland Kitchen atorvastatin (LIPITOR) 80 MG tablet Take 1 tablet (80 mg total) daily by mouth.  . diazepam (VALIUM) 5 MG tablet Take 2.5-5 mg 3 (three) times daily as needed by mouth for  anxiety.   Marland Kitchen glipiZIDE (GLUCOTROL) 10 MG tablet Take 10 mg 2 (two) times daily before a meal by mouth.   . hydrochlorothiazide (HYDRODIURIL) 25 MG tablet Take 25 mg daily by mouth.  Marland Kitchen lisinopril (PRINIVIL,ZESTRIL) 40 MG tablet Take 40 mg daily by mouth.  . metFORMIN (GLUCOPHAGE) 500 MG tablet Take 1 tablet (500 mg total) 2 (two) times daily with a meal by mouth. Resume on 03/03/17.  Marland Kitchen metoprolol succinate (TOPROL-XL) 100 MG 24 hr tablet Take 100 mg daily by mouth. Take with or immediately following a meal.  . nitroGLYCERIN (NITROSTAT) 0.4 MG SL tablet Place 1 tablet (0.4 mg total) every 5 (five) minutes as needed under the tongue for chest pain.     Allergies:   Patient has no known allergies.   Social History   Socioeconomic History  . Marital status: Divorced    Spouse name: None  . Number of children: None  . Years of education: None  . Highest education level: None  Social Needs  . Financial resource strain: None  . Food insecurity - worry: None  . Food insecurity - inability: None  . Transportation needs - medical: None  . Transportation needs - non-medical: None  Occupational History  . None  Tobacco Use  . Smoking status: Never Smoker  . Smokeless tobacco: Never Used  Substance and Sexual Activity  .  Alcohol use: Yes    Comment: weekly  . Drug use: No  . Sexual activity: None  Other Topics Concern  . None  Social History Narrative  . None     Family History: The patient's family history includes Hypertension in his father and mother. ROS:   Please see the history of present illness.    All 14 point review of systems negative except as described per history of present illness  EKGs/Labs/Other Studies Reviewed:      Recent Labs: 02/27/2017: ALT 33; Hemoglobin 16.8; Platelets 197 02/28/2017: BUN 16; Creatinine, Ser 1.01; Potassium 3.8; Sodium 137  Recent Lipid Panel    Component Value Date/Time   CHOL 191 02/28/2017 0017   TRIG 288 (H) 02/28/2017 0017    HDL 52 02/28/2017 0017   CHOLHDL 3.7 02/28/2017 0017   VLDL 58 (H) 02/28/2017 0017   LDLCALC 81 02/28/2017 0017    Physical Exam:    VS:  BP 140/82   Pulse (!) 113   Ht 5\' 7"  (1.702 m)   Wt 233 lb (105.7 kg)   SpO2 93%   BMI 36.49 kg/m     Wt Readings from Last 3 Encounters:  04/29/17 233 lb (105.7 kg)  03/11/17 237 lb (107.5 kg)  02/27/17 225 lb (102.1 kg)     GEN:  Well nourished, well developed in no acute distress HEENT: Normal NECK: No JVD; No carotid bruits LYMPHATICS: No lymphadenopathy CARDIAC: RRR, no murmurs, no rubs, no gallops RESPIRATORY:  Clear to auscultation without rales, wheezing or rhonchi  ABDOMEN: Soft, non-tender, non-distended MUSCULOSKELETAL:  No edema; No deformity  SKIN: Warm and dry LOWER EXTREMITIES: no swelling NEUROLOGIC:  Alert and oriented x 3 PSYCHIATRIC:  Normal affect   ASSESSMENT:    1. Coronary artery disease involving native coronary artery of native heart without angina pectoris   2. Type 2 diabetes mellitus with complication, without long-term current use of insulin (HCC)    PLAN:    In order of problems listed above:  1. Coronary artery disease: Doing well from that point of view denies having any symptoms I think the key will be now to modify his risk factors for coronary artery disease.  Therefore, he is deciding to join gym and I strongly recommended to do this and that with increased exercise he will be able to control his diabetes better as well as he will be able to reduce his cholesterol. 2. Diabetes mellitus: Last hemoglobin A1c 7.8 which is clearly too high.  I would like him to have hemoglobin A1c close to 7.  I think the key also is put him on good medications in my opinion he can benefit from empagliflozin and should be on this medication.  Hopefully with exercises of the regular basis and some weight loss he is diabetes will be better managed as well.  Glucotrol probably is not the best medication for  him 3. Dyslipidemia within the next 2 months we will recheck his fasting lipid profile.  His situation and his LDL need to be close to 50. 4. Essential hypertension: Blood pressure appears to be well controlled we will continue present management.   Medication Adjustments/Labs and Tests Ordered: Current medicines are reviewed at length with the patient today.  Concerns regarding medicines are outlined above.  No orders of the defined types were placed in this encounter.  Medication changes: No orders of the defined types were placed in this encounter.   Signed, Georgeanna Leaobert J. Acxel Dingee, MD, [facility] 04/29/2017 9:36  AM    [place]

## 2017-04-29 NOTE — Patient Instructions (Addendum)
Medication Instructions:  Your physician recommends that you continue on your current medications as directed. Please refer to the Current Medication list given to you today.  Labwork: Your physician recommends that you return for a FASTING lipid profile: 2 months.   Testing/Procedures: None   Follow-Up: Your physician recommends that you schedule a follow-up appointment in: 3 months  Any Other Special Instructions Will Be Listed Below (If Applicable).  Please note that any paperwork needing to be filled out by the provider will need to be addressed at the front desk prior to seeing the provider. Please note that any paperwork FMLA, Disability or other documents regarding health condition is subject to a $25.00 charge that must be received prior to completion of paperwork in the form of a money order or check.    If you need a refill on your cardiac medications before your next appointment, please call your pharmacy.

## 2017-04-29 NOTE — Addendum Note (Signed)
Addended by: Rodney LangtonITTER, JADA M on: 04/29/2017 09:45 AM   Modules accepted: Orders

## 2017-06-26 DIAGNOSIS — E0590 Thyrotoxicosis, unspecified without thyrotoxic crisis or storm: Secondary | ICD-10-CM

## 2017-06-26 HISTORY — DX: Thyrotoxicosis, unspecified without thyrotoxic crisis or storm: E05.90

## 2017-07-03 DIAGNOSIS — E042 Nontoxic multinodular goiter: Secondary | ICD-10-CM | POA: Insufficient documentation

## 2017-07-03 HISTORY — DX: Nontoxic multinodular goiter: E04.2

## 2018-02-10 ENCOUNTER — Emergency Department (HOSPITAL_BASED_OUTPATIENT_CLINIC_OR_DEPARTMENT_OTHER)
Admission: EM | Admit: 2018-02-10 | Discharge: 2018-02-10 | Disposition: A | Discharge status: Home / Self Care | Payer: Medicare Other | Attending: Emergency Medicine | Admitting: Emergency Medicine

## 2018-02-10 ENCOUNTER — Encounter (HOSPITAL_BASED_OUTPATIENT_CLINIC_OR_DEPARTMENT_OTHER): Payer: Self-pay

## 2018-02-10 DIAGNOSIS — I2510 Atherosclerotic heart disease of native coronary artery without angina pectoris: Secondary | ICD-10-CM | POA: Diagnosis not present

## 2018-02-10 DIAGNOSIS — E119 Type 2 diabetes mellitus without complications: Secondary | ICD-10-CM | POA: Insufficient documentation

## 2018-02-10 DIAGNOSIS — I10 Essential (primary) hypertension: Secondary | ICD-10-CM | POA: Diagnosis not present

## 2018-02-10 DIAGNOSIS — E785 Hyperlipidemia, unspecified: Secondary | ICD-10-CM | POA: Insufficient documentation

## 2018-02-10 DIAGNOSIS — Z79899 Other long term (current) drug therapy: Secondary | ICD-10-CM | POA: Diagnosis not present

## 2018-02-10 DIAGNOSIS — Z7984 Long term (current) use of oral hypoglycemic drugs: Secondary | ICD-10-CM | POA: Diagnosis not present

## 2018-02-10 DIAGNOSIS — I471 Supraventricular tachycardia: Secondary | ICD-10-CM | POA: Insufficient documentation

## 2018-02-10 DIAGNOSIS — R079 Chest pain, unspecified: Secondary | ICD-10-CM | POA: Diagnosis present

## 2018-02-10 DIAGNOSIS — I252 Old myocardial infarction: Secondary | ICD-10-CM | POA: Diagnosis not present

## 2018-02-10 DIAGNOSIS — R0602 Shortness of breath: Secondary | ICD-10-CM | POA: Diagnosis not present

## 2018-02-10 DIAGNOSIS — Z7982 Long term (current) use of aspirin: Secondary | ICD-10-CM | POA: Insufficient documentation

## 2018-02-10 HISTORY — DX: Atherosclerotic heart disease of native coronary artery without angina pectoris: I25.10

## 2018-02-10 LAB — CBC WITH DIFFERENTIAL/PLATELET
Abs Immature Granulocytes: 0.05 10*3/uL (ref 0.00–0.07)
BASOS ABS: 0.1 10*3/uL (ref 0.0–0.1)
Basophils Relative: 1 %
EOS ABS: 0.1 10*3/uL (ref 0.0–0.5)
EOS PCT: 0 %
HEMATOCRIT: 52 % (ref 39.0–52.0)
HEMOGLOBIN: 17 g/dL (ref 13.0–17.0)
Immature Granulocytes: 0 %
Lymphocytes Relative: 26 %
Lymphs Abs: 3 10*3/uL (ref 0.7–4.0)
MCH: 31.8 pg (ref 26.0–34.0)
MCHC: 32.7 g/dL (ref 30.0–36.0)
MCV: 97.2 fL (ref 80.0–100.0)
MONOS PCT: 7 %
Monocytes Absolute: 0.8 10*3/uL (ref 0.1–1.0)
NEUTROS ABS: 7.6 10*3/uL (ref 1.7–7.7)
Neutrophils Relative %: 66 %
Platelets: 236 10*3/uL (ref 150–400)
RBC: 5.35 MIL/uL (ref 4.22–5.81)
RDW: 12.9 % (ref 11.5–15.5)
WBC: 11.5 10*3/uL — ABNORMAL HIGH (ref 4.0–10.5)
nRBC: 0 % (ref 0.0–0.2)

## 2018-02-10 LAB — BASIC METABOLIC PANEL
Anion gap: 9 (ref 5–15)
BUN: 16 mg/dL (ref 8–23)
CALCIUM: 9.6 mg/dL (ref 8.9–10.3)
CO2: 24 mmol/L (ref 22–32)
Chloride: 103 mmol/L (ref 98–111)
Creatinine, Ser: 0.9 mg/dL (ref 0.61–1.24)
GFR calc Af Amer: >60 mL/min (ref >60)
Glucose, Bld: 114 mg/dL — ABNORMAL HIGH (ref 70–99)
Potassium: 3.7 mmol/L (ref 3.5–5.1)
SODIUM: 136 mmol/L (ref 135–145)

## 2018-02-10 LAB — COMPREHENSIVE METABOLIC PANEL
ALBUMIN: 4.8 g/dL (ref 3.5–5.0)
ALK PHOS: 76 U/L (ref 38–126)
ALT: 42 U/L (ref 0–44)
ANION GAP: 17 — AB (ref 5–15)
AST: 33 U/L (ref 15–41)
BILIRUBIN TOTAL: 1.2 mg/dL (ref 0.3–1.2)
BUN: 16 mg/dL (ref 8–23)
CALCIUM: 10.2 mg/dL (ref 8.9–10.3)
CO2: 21 mmol/L — ABNORMAL LOW (ref 22–32)
Chloride: 98 mmol/L (ref 98–111)
Creatinine, Ser: 1.29 mg/dL — ABNORMAL HIGH (ref 0.61–1.24)
GFR calc Af Amer: >60 mL/min (ref >60)
GFR, EST NON AFRICAN AMERICAN: 56 mL/min — AB (ref >60)
GLUCOSE: 213 mg/dL — AB (ref 70–99)
Potassium: 4.4 mmol/L (ref 3.5–5.1)
Sodium: 136 mmol/L (ref 135–145)
TOTAL PROTEIN: 7.6 g/dL (ref 6.5–8.1)

## 2018-02-10 LAB — TROPONIN I

## 2018-02-10 MED ORDER — METOPROLOL SUCCINATE ER 25 MG PO TB24
100.0000 mg | ORAL_TABLET | Freq: Every day | ORAL | Status: DC
  Administered 2018-02-10: 100 mg via ORAL
  Filled 2018-02-10: qty 4

## 2018-02-10 MED ORDER — SODIUM CHLORIDE 0.9 % IV BOLUS: 1000.0000 mL | Freq: Once | INTRAVENOUS | Status: DC

## 2018-02-10 MED ORDER — ADENOSINE 6 MG/2ML IV SOLN
12.0000 mg | Freq: Once | INTRAVENOUS | Status: AC
  Administered 2018-02-10: 12 mg via INTRAVENOUS

## 2018-02-10 MED ORDER — MORPHINE SULFATE (PF) 4 MG/ML IV SOLN
4.0000 mg | Freq: Once | INTRAVENOUS | Status: AC
  Administered 2018-02-10: 4 mg via INTRAVENOUS
  Filled 2018-02-10: qty 1

## 2018-02-10 MED ORDER — ADENOSINE 6 MG/2ML IV SOLN
INTRAVENOUS | Status: AC
  Filled 2018-02-10: qty 4

## 2018-02-10 NOTE — ED Notes (Signed)
Pt. Reports he is having shob with O2 at 2 liters [place] and ST at 111 rate.  Called for EDP  [facility] in to see Pt.  Pt. A & O.  Pt. Reports he just has a tightness in his chest.  Pt. Said he took NTG before coming to ED

## 2018-02-10 NOTE — Discharge Instructions (Addendum)
You need to follow-up with your cardiologist this week.  He is asked that we increase your metoprolol to 200 mg daily.  If you develop any further chest pain or shortness of breath return to the ED immediately.

## 2018-02-10 NOTE — ED Notes (Signed)
Patient denies pain and is resting comfortably.  

## 2018-02-10 NOTE — ED Notes (Signed)
EDP Joselyn Glassman PA C spoke indepth with Pt. And pt. Daughter about after ED care and follow up care with Cardiology.  Pt. Verbalized understanding.

## 2018-02-10 NOTE — ED Notes (Signed)
Pt presented diaphoretic holding his chest, states CP/SOB/Nausea x1hr, took 1 nitro with relief but didn't last  long

## 2018-02-10 NOTE — ED Notes (Signed)
EDP asked for another EKG before Pt. To be discharged

## 2018-02-12 ENCOUNTER — Emergency Department (HOSPITAL_BASED_OUTPATIENT_CLINIC_OR_DEPARTMENT_OTHER)
Admission: EM | Admit: 2018-02-12 | Discharge: 2018-02-13 | Disposition: A | Discharge status: Home / Self Care | Payer: Medicare Other | Attending: Emergency Medicine | Admitting: Emergency Medicine

## 2018-02-12 ENCOUNTER — Other Ambulatory Visit: Payer: Self-pay

## 2018-02-12 ENCOUNTER — Encounter (HOSPITAL_BASED_OUTPATIENT_CLINIC_OR_DEPARTMENT_OTHER): Payer: Self-pay | Admitting: *Deleted

## 2018-02-12 ENCOUNTER — Telehealth: Payer: Self-pay | Admitting: Cardiology

## 2018-02-12 ENCOUNTER — Emergency Department (HOSPITAL_BASED_OUTPATIENT_CLINIC_OR_DEPARTMENT_OTHER): Payer: Medicare Other

## 2018-02-12 DIAGNOSIS — Z7984 Long term (current) use of oral hypoglycemic drugs: Secondary | ICD-10-CM | POA: Diagnosis not present

## 2018-02-12 DIAGNOSIS — R002 Palpitations: Secondary | ICD-10-CM | POA: Diagnosis not present

## 2018-02-12 DIAGNOSIS — I10 Essential (primary) hypertension: Secondary | ICD-10-CM | POA: Diagnosis not present

## 2018-02-12 DIAGNOSIS — R778 Other specified abnormalities of plasma proteins: Secondary | ICD-10-CM

## 2018-02-12 DIAGNOSIS — R7989 Other specified abnormal findings of blood chemistry: Secondary | ICD-10-CM | POA: Diagnosis not present

## 2018-02-12 DIAGNOSIS — Z79899 Other long term (current) drug therapy: Secondary | ICD-10-CM | POA: Insufficient documentation

## 2018-02-12 DIAGNOSIS — Z7982 Long term (current) use of aspirin: Secondary | ICD-10-CM | POA: Insufficient documentation

## 2018-02-12 DIAGNOSIS — I2510 Atherosclerotic heart disease of native coronary artery without angina pectoris: Secondary | ICD-10-CM

## 2018-02-12 DIAGNOSIS — E119 Type 2 diabetes mellitus without complications: Secondary | ICD-10-CM | POA: Insufficient documentation

## 2018-02-12 DIAGNOSIS — I252 Old myocardial infarction: Secondary | ICD-10-CM | POA: Insufficient documentation

## 2018-02-12 DIAGNOSIS — I214 Non-ST elevation (NSTEMI) myocardial infarction: Secondary | ICD-10-CM

## 2018-02-12 DIAGNOSIS — R079 Chest pain, unspecified: Secondary | ICD-10-CM

## 2018-02-12 DIAGNOSIS — R000 Tachycardia, unspecified: Secondary | ICD-10-CM | POA: Diagnosis present

## 2018-02-12 LAB — COMPREHENSIVE METABOLIC PANEL
ALBUMIN: 4.4 g/dL (ref 3.5–5.0)
ALT: 35 U/L (ref 0–44)
AST: 24 U/L (ref 15–41)
Alkaline Phosphatase: 71 U/L (ref 38–126)
Anion gap: 13 (ref 5–15)
BUN: 16 mg/dL (ref 8–23)
CHLORIDE: 96 mmol/L — AB (ref 98–111)
CO2: 25 mmol/L (ref 22–32)
CREATININE: 1.03 mg/dL (ref 0.61–1.24)
Calcium: 9.7 mg/dL (ref 8.9–10.3)
GFR calc Af Amer: >60 mL/min (ref >60)
GFR calc non Af Amer: >60 mL/min (ref >60)
GLUCOSE: 183 mg/dL — AB (ref 70–99)
POTASSIUM: 3.6 mmol/L (ref 3.5–5.1)
SODIUM: 134 mmol/L — AB (ref 135–145)
Total Bilirubin: 0.6 mg/dL (ref 0.3–1.2)
Total Protein: 7.1 g/dL (ref 6.5–8.1)

## 2018-02-12 LAB — TROPONIN I
TROPONIN I: 0.08 ng/mL — AB (ref <0.03)
TROPONIN I: 0.1 ng/mL — AB (ref <0.03)

## 2018-02-12 LAB — CBC WITH DIFFERENTIAL/PLATELET
ABS IMMATURE GRANULOCYTES: 0.02 10*3/uL (ref 0.00–0.07)
Basophils Absolute: 0 10*3/uL (ref 0.0–0.1)
Basophils Relative: 1 %
EOS PCT: 1 %
Eosinophils Absolute: 0 10*3/uL (ref 0.0–0.5)
HCT: 49 % (ref 39.0–52.0)
HEMOGLOBIN: 15.8 g/dL (ref 13.0–17.0)
Immature Granulocytes: 0 %
LYMPHS PCT: 23 %
Lymphs Abs: 2 10*3/uL (ref 0.7–4.0)
MCH: 31.5 pg (ref 26.0–34.0)
MCHC: 32.2 g/dL (ref 30.0–36.0)
MCV: 97.8 fL (ref 80.0–100.0)
MONOS PCT: 8 %
Monocytes Absolute: 0.7 10*3/uL (ref 0.1–1.0)
NEUTROS ABS: 6.1 10*3/uL (ref 1.7–7.7)
Neutrophils Relative %: 67 %
Platelets: 189 10*3/uL (ref 150–400)
RBC: 5.01 MIL/uL (ref 4.22–5.81)
RDW: 13 % (ref 11.5–15.5)
WBC: 8.9 10*3/uL (ref 4.0–10.5)
nRBC: 0 % (ref 0.0–0.2)

## 2018-02-12 NOTE — ED Notes (Addendum)
Critical trop 0.08 MD notified

## 2018-02-12 NOTE — Telephone Encounter (Signed)
Get troponin I  Please

## 2018-02-12 NOTE — ED Triage Notes (Signed)
Pt c/o CP x 1 day ago with SOB and left arm numbness , labs done today at cardiology and Trop 0.04

## 2018-02-12 NOTE — ED Provider Notes (Addendum)
[facility] EMERGENCY DEPARTMENT Provider Note   CSN: [id number] Arrival date & time: 02/12/18  1937     History   Chief Complaint Chief Complaint  Patient presents with  . Chest Pain    HPI Tyler Ellis is a 67 y.o. male.  HPI Patient presents with elevated troponin.  States that last night he felt his heart racing a couple different times and felt someShortness of breath and left arm numbness.  Called his cardiologist due to an outpatient troponin that was reportedly elevated.  Reviewed records showed it is greater than 0.04 but that is all it clarifies.  Pain-free now.  Called and told to come into the ER.  2 days ago he was seen in the ER for an SVT.  Given adenosine.  Troponin at that time was less than 0.03.  Has follow-up in 2 days with cardiology.  No lightheadedness or dizziness.  No fevers or chills.  No cough.  Patient had heart cath one year ago that showed mild nonobstructive disease.  Had elevated troponin at that time also. Past Medical History:  Diagnosis Date  . Coronary artery disease   . Diabetes mellitus without complication (HCC)   . Hypertension     Patient Active Problem List   Diagnosis Date Noted  . Coronary artery disease 03/11/2017  . Type 2 diabetes mellitus with complication, without long-term current use of insulin (HCC) 03/11/2017  . Dyslipidemia 03/11/2017  . Essential hypertension 03/11/2017  . NSTEMI (non-ST elevated myocardial infarction) (HCC) 02/27/2017    Past Surgical History:  Procedure Laterality Date  . KNEE SURGERY    . LEFT HEART CATH AND CORONARY ANGIOGRAPHY N/A 02/28/2017   Procedure: LEFT HEART CATH AND CORONARY ANGIOGRAPHY;  Surgeon: Kathleene Hazel, MD;  Location: [facility] INVASIVE CV LAB;  Service: Cardiovascular;  Laterality: N/A;        Home Medications    Prior to Admission medications   Medication Sig Start Date End Date Taking? Authorizing Provider  acetaminophen-codeine (TYLENOL #3) 300-30 MG tablet  Take 1 tablet every 8 (eight) hours as needed by mouth for moderate pain.     [provider]  amLODipine (NORVASC) 5 MG tablet Take 5 mg daily by mouth.    [provider]  aspirin EC 81 MG EC tablet Take 1 tablet (81 mg total) daily by mouth. 03/01/17   [facility], Roe Rutherford, PA  atorvastatin (LIPITOR) 80 MG tablet Take 1 tablet (80 mg total) daily by mouth. 02/28/17   [facility], Roe Rutherford, PA  diazepam (VALIUM) 5 MG tablet Take 2.5-5 mg 3 (three) times daily as needed by mouth for anxiety.     [provider]  glipiZIDE (GLUCOTROL) 10 MG tablet Take 10 mg 2 (two) times daily before a meal by mouth.     [provider]  hydrochlorothiazide (HYDRODIURIL) 25 MG tablet Take 25 mg daily by mouth.    [provider]  lisinopril (PRINIVIL,ZESTRIL) 40 MG tablet Take 40 mg daily by mouth.    [provider]  metFORMIN (GLUCOPHAGE) 500 MG tablet Take 1 tablet (500 mg total) 2 (two) times daily with a meal by mouth. Resume on 03/03/17. 02/28/17   Marcelino Duster, PA  metoprolol succinate (TOPROL-XL) 100 MG 24 hr tablet Take 100 mg daily by mouth. Take with or immediately following a meal.    [provider]  nitroGLYCERIN (NITROSTAT) 0.4 MG SL tablet Place 1 tablet (0.4 mg total) every 5 (five) minutes as needed under the  tongue for chest pain. 03/11/17 06/09/17  Georgeanna Lea, MD    Family History Family History  Problem Relation Age of Onset  . Hypertension Father   . Hypertension Mother     Social History Social History   Tobacco Use  . Smoking status: Never Smoker  . Smokeless tobacco: Never Used  Substance Use Topics  . Alcohol use: Yes    Comment: weekly  . Drug use: No     Allergies   Patient has no known allergies.   Review of Systems Review of Systems  Constitutional: Negative for appetite change.  HENT: Negative for congestion.   Respiratory: Positive for shortness of breath.   Cardiovascular: Positive for  palpitations. Negative for chest pain.  Gastrointestinal: Negative for abdominal pain.  Genitourinary: Negative for flank pain.  Musculoskeletal: Negative for back pain.  Skin: Negative for rash.  Neurological: Positive for numbness.  Psychiatric/Behavioral: Negative for confusion.     Physical Exam Updated Vital Signs BP (!) 144/95   Pulse 90   Resp (!) 24   Ht 5\' 6"  (1.676 m)   Wt 105 kg   SpO2 93%   BMI 37.36 kg/m   Physical Exam  Constitutional: He appears well-developed.  HENT:  Head: Normocephalic.  Neck: Neck supple.  Cardiovascular: Normal rate and regular rhythm.  Pulmonary/Chest: Effort normal. He has no decreased breath sounds.  Musculoskeletal:       Right lower leg: He exhibits no edema.       Left lower leg: He exhibits no edema.  Neurological: He is alert.  Skin: Skin is warm. Capillary refill takes less than 2 seconds.  Psychiatric: He has a normal mood and affect.     ED Treatments / Results  Labs (all labs ordered are listed, but only abnormal results are displayed) Labs Reviewed  TROPONIN I - Abnormal; Notable for the following components:      Result Value   Troponin I 0.08 (*)    All other components within normal limits  COMPREHENSIVE METABOLIC PANEL - Abnormal; Notable for the following components:   Sodium 134 (*)    Chloride 96 (*)    Glucose, Bld 183 (*)    All other components within normal limits  TROPONIN I - Abnormal; Notable for the following components:   Troponin I 0.10 (*)    All other components within normal limits  CBC WITH DIFFERENTIAL/PLATELET    EKG EKG Interpretation  Date/Time:  Wednesday February 12 2018 19:47:07 EDT Ventricular Rate:  106 PR Interval:    QRS Duration: 88 QT Interval:  341 QTC Calculation: 453 R Axis:   0 Text Interpretation:  Sinus tachycardia Baseline wander in lead(s) II III aVL aVF V3 Confirmed by Benjiman Core [phone]) on 02/12/2018 9:11:43 PM   Radiology Dg Chest 2 View  Result  Date: 02/12/2018 CLINICAL DATA:  Chest pain EXAM: CHEST - 2 VIEW COMPARISON:  02/27/2017 FINDINGS: Heart and mediastinal contours are within normal limits. No focal opacities or effusions. No acute bony abnormality. IMPRESSION: No active cardiopulmonary disease. Electronically Signed   By: Charlett Nose M.D.   On: 02/12/2018 20:06    Procedures Procedures (including critical care time)  Medications Ordered in ED Medications - No data to display   Initial Impression / Assessment and Plan / ED Course  I have reviewed the triage vital signs and the nursing notes.  Pertinent labs & imaging results that were available during my care of the patient were reviewed by me and considered  in my medical decision making (see chart for details).     Patient felt his heart racing last night with some numbness in his left arm.  Recent SVT requiring adenosine.  Troponin mildly elevated for cardiologist today.  However unknown on the actual number for it.  It was 0.08 on the first today and 0.1 on retest.  I discussed with Dr. Sherlie Ban.  This is stable.  May be due to the SVT.  Patient already has an increased metoprolol from the recent SVT.  Has follow-up with his cardiologist in 2 days.  At this point I think it is okay for discharge home.  We will follow-up in 2 days with cardiologist and return to the ER sooner if he feels his heart continued to go fast.  Patient had a heart cath under a year ago with only mild disease.  Final Clinical Impressions(s) / ED Diagnoses   Final diagnoses:  Palpitations  Elevated troponin    ED Discharge Orders    None      Benjiman Core, MD 02/13/18 Jorje Guild  Benjiman Core, MD 02/13/18 0005

## 2018-02-12 NOTE — Discharge Instructions (Addendum)
If your heart rate is increased again.  Call your cardiologist or come into the ER.

## 2018-02-12 NOTE — Telephone Encounter (Signed)
Patient reports chest tightness last night along with increased heart rate and palpitations. Patient eventually went to sleep and this morning he is reporting central chest pain only when he coughs. He also is having left hand numbness which he has had off and on for 9 months. He was seen in the emergency department on 02/10/18 for SVT, he was converted to NSR. He is scheduled to see Dr. Bing Matter on Friday 02/14/18. Will consult with Dr.Krasowski.

## 2018-02-12 NOTE — ED Provider Notes (Signed)
[facility] EMERGENCY DEPARTMENT Provider Note   CSN: [id number] Arrival date & time: 02/10/18  1402     History   Chief Complaint Chief Complaint  Patient presents with  . Chest Pain    HPI Tyler Ellis is a 67 y.o. male.  HPI 67 year old African-American male with a past medical history significant for diabetes, hypertension, CAD, MI presents to the emergency department today for evaluation of chest pain.  Patient states that approximately 2 hours prior to coming to the ED he was sitting down at the kitchen table when he began to have a "pressure" in the center of his chest.  He reports associated shortness of breath and diaphoresis.  Patient reports some intermittent dizziness at that time.  Patient states that the symptoms persisted.  He did try a nitro which had some mild relief but quickly came back.  He states that the pain came back stronger.  He states that he does have a history of MI last year.  On review of patient's prior records he was admitted to the hospital for a NSTEMI.  Patient underwent a cardiac catheterization that time that showed mild nonobstructive CAD.  This cardiac catheterization was in 2018.  Patient is being medically managed in the outpatient setting by cardiology.  He does report feeling palpitations intermittently at times.  He is on metoprolol but did not take his metoprolol today.  Patient states that he is continued to have 10/10 substernal chest pressure.  He also reports shortness of breath and is diaphoretic.  The pain does not radiate.  Not associate with exertion.  Not pleuritic in nature.  The pain has been constant.  Besides the nitroglycerin he has taken no other medications for his symptoms.  Does report this feels similar to his last heart attack.  Pt denies any fever, chill, ha, vision changes, lightheadedness, dizziness, congestion, neck pain, cough, abd pain, n/v/d, urinary symptoms, change in bowel habits, melena, hematochezia, lower  extremity paresthesias.  Past Medical History:  Diagnosis Date  . Coronary artery disease   . Diabetes mellitus without complication (HCC)   . Hypertension     Patient Active Problem List   Diagnosis Date Noted  . Coronary artery disease 03/11/2017  . Type 2 diabetes mellitus with complication, without long-term current use of insulin (HCC) 03/11/2017  . Dyslipidemia 03/11/2017  . Essential hypertension 03/11/2017  . NSTEMI (non-ST elevated myocardial infarction) (HCC) 02/27/2017    Past Surgical History:  Procedure Laterality Date  . KNEE SURGERY    . LEFT HEART CATH AND CORONARY ANGIOGRAPHY N/A 02/28/2017   Procedure: LEFT HEART CATH AND CORONARY ANGIOGRAPHY;  Surgeon: Kathleene Hazel, MD;  Location: [facility] INVASIVE CV LAB;  Service: Cardiovascular;  Laterality: N/A;        Home Medications    Prior to Admission medications   Medication Sig Start Date End Date Taking? Authorizing Provider  acetaminophen-codeine (TYLENOL #3) 300-30 MG tablet Take 1 tablet every 8 (eight) hours as needed by mouth for moderate pain.     [provider]  amLODipine (NORVASC) 5 MG tablet Take 5 mg daily by mouth.    [provider]  aspirin EC 81 MG EC tablet Take 1 tablet (81 mg total) daily by mouth. 03/01/17   Duke, Roe Rutherford, PA  atorvastatin (LIPITOR) 80 MG tablet Take 1 tablet (80 mg total) daily by mouth. 02/28/17   Duke, Roe Rutherford, PA  diazepam (VALIUM) 5 MG tablet Take 2.5-5 mg 3 (three) times daily  as needed by mouth for anxiety.     [provider]  glipiZIDE (GLUCOTROL) 10 MG tablet Take 10 mg 2 (two) times daily before a meal by mouth.     [provider]  hydrochlorothiazide (HYDRODIURIL) 25 MG tablet Take 25 mg daily by mouth.    [provider]  lisinopril (PRINIVIL,ZESTRIL) 40 MG tablet Take 40 mg daily by mouth.    [provider]  metFORMIN (GLUCOPHAGE) 500 MG tablet Take 1 tablet (500 mg total) 2 (two) times daily  with a meal by mouth. Resume on 03/03/17. 02/28/17   Marcelino Duster, PA  metoprolol succinate (TOPROL-XL) 100 MG 24 hr tablet Take 100 mg daily by mouth. Take with or immediately following a meal.    [provider]  nitroGLYCERIN (NITROSTAT) 0.4 MG SL tablet Place 1 tablet (0.4 mg total) every 5 (five) minutes as needed under the tongue for chest pain. 03/11/17 06/09/17  Georgeanna Lea, MD    Family History Family History  Problem Relation Age of Onset  . Hypertension Father   . Hypertension Mother     Social History Social History   Tobacco Use  . Smoking status: Never Smoker  . Smokeless tobacco: Never Used  Substance Use Topics  . Alcohol use: Yes    Comment: weekly  . Drug use: No     Allergies   Patient has no known allergies.   Review of Systems Review of Systems  All other systems reviewed and are negative.    Physical Exam Updated Vital Signs BP 134/87   Pulse 89   Temp 98.1 F (36.7 C) (Oral)   Resp 17   SpO2 97%   Physical Exam  Constitutional: He is oriented to person, place, and time. He appears well-developed and well-nourished.  Non-toxic appearance. No distress.  diaphoretic  HENT:  Head: Normocephalic and atraumatic.  Mouth/Throat: Oropharynx is clear and moist.  Eyes: Conjunctivae are normal. Right eye exhibits no discharge. Left eye exhibits no discharge.  Neck: Normal range of motion. Neck supple. No JVD present. No tracheal deviation present.  Cardiovascular: Normal heart sounds, intact distal pulses and normal pulses. Tachycardia present. Exam reveals no gallop.  No murmur heard. Pulmonary/Chest: Breath sounds normal. No stridor. Tachypnea noted. No respiratory distress. He has no wheezes. He has no rales. He exhibits no tenderness.  No hypoxia or tachypnea.  Abdominal: Soft. Bowel sounds are normal. He exhibits no distension. There is no tenderness. There is no rebound and no guarding.  Musculoskeletal: Normal range of  motion.  No lower extremity edema or calf tenderness.  Lymphadenopathy:    He has no cervical adenopathy.  Neurological: He is alert and oriented to person, place, and time.  Skin: Skin is warm and dry. Capillary refill takes less than 2 seconds. He is not diaphoretic.  Psychiatric: His behavior is normal. Judgment and thought content normal.  Nursing note and vitals reviewed.    ED Treatments / Results  Labs (all labs ordered are listed, but only abnormal results are displayed) Labs Reviewed  CBC WITH DIFFERENTIAL/PLATELET - Abnormal; Notable for the following components:      Result Value   WBC 11.5 (*)    All other components within normal limits  COMPREHENSIVE METABOLIC PANEL - Abnormal; Notable for the following components:   CO2 21 (*)    Glucose, Bld 213 (*)    Creatinine, Ser 1.29 (*)    GFR calc non Af Amer 56 (*)    Anion  gap 17 (*)    All other components within normal limits  BASIC METABOLIC PANEL - Abnormal; Notable for the following components:   Glucose, Bld 114 (*)    All other components within normal limits  TROPONIN I    EKG EKG Interpretation  Date/Time:  Monday February 10 2018 14:17:14 EDT Ventricular Rate:  115 PR Interval:    QRS Duration: 76 QT Interval:  314 QTC Calculation: 435 R Axis:   6 Text Interpretation:  Sinus tachycardia Nonspecific T abnormalities, lateral leads converted from SVT to sinus rhythm Confirmed by Frederick Peers [phone]) on 02/10/2018 3:59:54 PM   Radiology No results found.  Procedures .Critical Care Performed by: Rise Mu, PA-C Authorized by: Rise Mu, PA-C   Critical care provider statement:    Critical care time (minutes):  60   Critical care was necessary to treat or prevent imminent or life-threatening deterioration of the following conditions: SVT requiring adenosine.   Critical care was time spent personally by me on the following activities:  Development of treatment plan with patient  or surrogate, discussions with consultants, discussions with primary provider, evaluation of patient's response to treatment, examination of patient, ordering and performing treatments and interventions, ordering and review of laboratory studies, ordering and review of radiographic studies, pulse oximetry, re-evaluation of patient's condition and review of old charts   (including critical care time)  Medications Ordered in ED Medications  adenosine (ADENOCARD) 6 MG/2ML injection 12 mg (12 mg Intravenous Given 02/10/18 1417)  morphine 4 MG/ML injection 4 mg (4 mg Intravenous Given 02/10/18 1440)     Initial Impression / Assessment and Plan / ED Course  I have reviewed the triage vital signs and the nursing notes.  Pertinent labs & imaging results that were available during my care of the patient were reviewed by me and considered in my medical decision making (see chart for details).     Patient presented to the ED for evaluation of chest pressure, shortness of breath, diaphoresis and palpitations.  Patient was brought back to the trauma room where he was hooked up to the twelve-lead.  He was noted to be in SVT at a rate of 200 bpm.  Patient was satting at 100%.  He was neurovascularly intact in all extremities.  He was mildly diaphoretic and had some tachypnea noted.  Modified Valsalva was used twice.  Patient had mild improvement to heart rates in the 160s from 190s and then quickly returned.  EKG confirmed what appeared to be SVT at a rate of 190 bpm.  Patient was given 12 mg of adenosine after patient was worked up to pads.   Patient converted to a sinus rhythm with a heart rate of 110.  EKG reveals sinus tachycardia.  No signs of A. fib or a flutter.  Patient had immediate resolution in symptoms.  Vital signs remained reassuring.  Several minutes after the episode he did report some mild pressure however this decreased prior to patient receiving morphine.  Basic lab work was  obtained.  Patient has no leukocytosis.  Normal hemoglobin.  Troponin was negative.  BNP did reveal mild dehydration with a anion gap of 17 and a decreased bicarb.  No other significant electrolyte derangement.  Mild elevation in glucose.  Patient given 1 L of fluid.  He did have some improvement of his sinus tachycardia heart rate improved to the 90s with fluids.  Suspect this was secondary to dehydration.  Doubt DKA.  Glucose improved with fluid as  well.  Patient does have a mild baseline tachycardia.  On prior review of his vital signs he does have a heart rate that has been pretty persistent in the low 100s.  He is on metoprolol but did not take that today.  Will give him his home dose of metoprolol in the ED today.  I did review patient's recent cardiac catheterization in the past year that showed some mild nonobstructive CAD with normal EF.  Had discussion with Dr.Skains with cardiology who advised that second trop is not necessary in the setting of svt. He felt that pt symptoms were consistent with svt and that he improved with conversion to sinus rhythm. He felt that pt could be discharged home with increase in metoprolol to 200mg  and that pt needs to follow up this week in the clinic.   Patient has remained asymptomatic in the ED.  He is tolerating p.o. fluids and food.  Repeat EKG shows normal sinus rhythm and appears similar to prior tracing without any signs of acute ischemic changes.  I do feel comfortable with discharge home with close outpatient follow-up.  Patient was seen and evaluated with my attending Dr. Clarene Duke who was agreeable with the above plan of care.    Patient was also comfortable with discharge home with close outpatient follow-up.  Final Clinical Impressions(s) / ED Diagnoses   Final diagnoses:  SVT (supraventricular tachycardia) (HCC)  Chest pain, unspecified type  SOB (shortness of breath)    ED Discharge Orders    None       Wallace Keller 02/12/18 1610    Little, Ambrose Finland, MD 02/16/18 [phone]

## 2018-02-12 NOTE — Telephone Encounter (Signed)
Patient informed to have labs drawn today. He will come to [place] today, he verbally understands.

## 2018-02-12 NOTE — Telephone Encounter (Signed)
Patient was woke up during the night with his heart beating fast which made his chest hurt.  He tried several things to make it ease off but nothing worked. Please call patient to discuss at [phone].

## 2018-02-12 NOTE — ED Notes (Signed)
ED Provider at bedside. 

## 2018-02-13 ENCOUNTER — Telehealth: Payer: Self-pay | Admitting: *Deleted

## 2018-02-13 LAB — TROPONIN I: Troponin I: 0.17 ng/mL (ref 0.00–0.04)

## 2018-02-13 NOTE — Telephone Encounter (Signed)
Pt phoned to let us know he went to the hospital per Dr. Bing Matter and they did more blood work, sent him home around midnight. Pt will be in here tomorrow to see Dr. Kirtland Bouchard.

## 2018-02-13 NOTE — Telephone Encounter (Signed)
Called to check on patient. Patient reports no chest pain today, he plans to follow up with Dr. Bing Matter at his appointment tomorrow. He was advised to go back to the emergency department if chest pain returns, he verbally understands.    [facility] also called with corrected results from yesterday's troponin I results at 14:45 his actually value was 0.17 Dr.Krasowski aware

## 2018-02-14 ENCOUNTER — Other Ambulatory Visit: Payer: Self-pay | Admitting: Cardiology

## 2018-02-14 ENCOUNTER — Ambulatory Visit (INDEPENDENT_AMBULATORY_CARE_PROVIDER_SITE_OTHER): Payer: Medicare Other | Admitting: Cardiology

## 2018-02-14 ENCOUNTER — Encounter: Payer: Self-pay | Admitting: Cardiology

## 2018-02-14 VITALS — BP 154/64 | HR 115 | Ht 67.0 in | Wt 225.0 lb

## 2018-02-14 DIAGNOSIS — I10 Essential (primary) hypertension: Secondary | ICD-10-CM | POA: Diagnosis not present

## 2018-02-14 DIAGNOSIS — I471 Supraventricular tachycardia, unspecified: Secondary | ICD-10-CM

## 2018-02-14 DIAGNOSIS — E118 Type 2 diabetes mellitus with unspecified complications: Secondary | ICD-10-CM | POA: Diagnosis not present

## 2018-02-14 DIAGNOSIS — E785 Hyperlipidemia, unspecified: Secondary | ICD-10-CM | POA: Diagnosis not present

## 2018-02-14 DIAGNOSIS — I2510 Atherosclerotic heart disease of native coronary artery without angina pectoris: Secondary | ICD-10-CM | POA: Diagnosis not present

## 2018-02-14 HISTORY — DX: Supraventricular tachycardia, unspecified: I47.10

## 2018-02-14 HISTORY — DX: Supraventricular tachycardia: I47.1

## 2018-02-14 MED ORDER — METOPROLOL SUCCINATE ER 50 MG PO TB24: 150.0000 mg | ORAL_TABLET | Freq: Every day | ORAL | 1 refills | Status: DC

## 2018-02-14 NOTE — Patient Instructions (Addendum)
Medicati on Instructions:  Your physician has recommended you make the following change in your medication:   INCREASE: Metoprolol succinate to 150mg  daily  If you need a refill on your cardiac medications before your next appointment, please call your pharmacy.   Lab work: None.   If you have labs (blood work) drawn today and your tests are completely normal, you will receive your results only by: Tyler Ellis MyChart Message (if you have MyChart) OR . A paper copy in the mail If you have any lab test that is abnormal or we need to change your treatment, we will call you to review the results.  Testing/Procedures: Your physician has requested that you have a lexiscan myoview. For further information please visit [email or web address] Please follow instruction sheet, as given.      Follow-Up: At [facility], you and your health needs are our priority.  As part of our continuing mission to provide you with exceptional heart care, we have created designated Provider Care Teams.  These Care Teams include your primary Cardiologist (physician) and Advanced Practice Providers (APPs -  Physician Assistants and Nurse Practitioners) who all work together to provide you with the care you need, when you need it. You will need a follow up appointment in 4 weeks.  Please call our office 2 months in advance to schedule this appointment.  You may see No primary care provider on file. or another member of our [employer] Provider Team in [place]: Norman Herrlich, MD . Belva Crome, MD  Any Other Special Instructions Will Be Listed Below (If Applicable).  Cardiac Nuclear Scan A cardiac nuclear scan is a test that measures blood flow to the heart when a person is resting and when he or she is exercising. The test looks for problems such as:  Not enough blood reaching a portion of the heart.  The heart muscle not working normally.  You may need this test if:  You have heart disease.  You have had  abnormal lab results.  You have had heart surgery or angioplasty.  You have chest pain.  You have shortness of breath.  In this test, a radioactive dye (tracer) is injected into your bloodstream. After the tracer has traveled to your heart, an imaging device is used to measure how much of the tracer is absorbed by or distributed to various areas of your heart. This procedure is usually done at a hospital and takes 2-4 hours. Tell a health care provider about:  Any allergies you have.  All medicines you are taking, including vitamins, herbs, eye drops, creams, and over-the-counter medicines.  Any problems you or family members have had with the use of anesthetic medicines.  Any blood disorders you have.  Any surgeries you have had.  Any medical conditions you have.  Whether you are pregnant or may be pregnant. What are the risks? Generally, this is a safe procedure. However, problems may occur, including:  Serious chest pain and heart attack. This is only a risk if the stress portion of the test is done.  Rapid heartbeat.  Sensation of warmth in your chest. This usually passes quickly.  What happens before the procedure?  Ask your health care provider about changing or stopping your regular medicines. This is especially important if you are taking diabetes medicines or blood thinners.  Remove your jewelry on the day of the procedure. What happens during the procedure?  An IV tube will be inserted into one of your veins.  Your health  care provider will inject a small amount of radioactive tracer through the tube.  You will wait for 20-40 minutes while the tracer travels through your bloodstream.  Your heart activity will be monitored with an electrocardiogram (ECG).  You will lie down on an exam table.  Images of your heart will be taken for about 15-20 minutes.  You may be asked to exercise on a treadmill or stationary bike. While you exercise, your heart's activity  will be monitored with an ECG, and your blood pressure will be checked. If you are unable to exercise, you may be given a medicine to increase blood flow to parts of your heart.  When blood flow to your heart has peaked, a tracer will again be injected through the IV tube.  After 20-40 minutes, you will get back on the exam table and have more images taken of your heart.  When the procedure is over, your IV tube will be removed. The procedure may vary among health care providers and hospitals. Depending on the type of tracer used, scans may need to be repeated 3-4 hours later. What happens after the procedure?  Unless your health care provider tells you otherwise, you may return to your normal schedule, including diet, activities, and medicines.  Unless your health care provider tells you otherwise, you may increase your fluid intake. This will help flush the contrast dye from your body. Drink enough fluid to keep your urine clear or pale yellow.  It is up to you to get your test results. Ask your health care provider, or the department that is doing the test, when your results will be ready. Summary  A cardiac nuclear scan measures the blood flow to the heart when a person is resting and when he or she is exercising.  You may need this test if you are at risk for heart disease.  Tell your health care provider if you are pregnant.  Unless your health care provider tells you otherwise, increase your fluid intake. This will help flush the contrast dye from your body. Drink enough fluid to keep your urine clear or pale yellow. This information is not intended to replace advice given to you by your health care provider. Make sure you discuss any questions you have with your health care provider. Document Released: 05/04/2004 Document Revised: 04/11/2016 Document Reviewed: 03/18/2013 Elsevier Interactive Patient Education  2017 [employer].

## 2018-02-14 NOTE — Progress Notes (Signed)
Cardiology Office Note:    Date:  02/14/2018   ID:  Tyler Ellis, DOB 1950-10-11, MRN [id number]  PCP:  Forrest Moron, MD  Cardiologist:  Gypsy Balsam, MD    Referring MD: Forrest Moron, MD   Chief Complaint  Patient presents with  . ER follow up  I have palpitations  History of Present Illness:    Tyler Ellis is a 67 y.o. male with coronary artery disease status post non-STEMI year ago after that he had cardiac catheterization which revealed only luminal disease has been managed medically successfully since that time he does complain of having some palpitations previously every about 2 to 3 months he had episode of his heart spitting up however within last 2 weeks it became much more frequent anytime his heart will go very fast he will also feel some tightness in the chest he ended up going to the emergency room EKG was done when he was having tachycardia he was identified to have AV nodal reentry tachycardia adenosine was given he converted to sinus rhythm and after that he was asked to increase dose of metoprolol from 100 200 mg however he misunderstood and understood the instruction and increase the dose of his metformin.  Arrhythmia still persisted.  Since that time he was discharged from the emergency room he had noted 2 episodes of supraventricular tachycardia.  While he was in the emergency room troponin I has been checked and being elevated.  When he does not have any tachycardia he denies having any chest pain he can walk climb stairs with no difficulties.  Past Medical History:  Diagnosis Date  . Coronary artery disease   . Diabetes mellitus without complication (HCC)   . Hypertension     Past Surgical History:  Procedure Laterality Date  . KNEE SURGERY    . LEFT HEART CATH AND CORONARY ANGIOGRAPHY N/A 02/28/2017   Procedure: LEFT HEART CATH AND CORONARY ANGIOGRAPHY;  Surgeon: Kathleene Hazel, MD;  Location: [facility];  Service: Cardiovascular;   Laterality: N/A;    Current Medications: Current Meds  Medication Sig  . acetaminophen-codeine (TYLENOL #3) 300-30 MG tablet Take 1 tablet every 8 (eight) hours as needed by mouth for moderate pain.   Marland Kitchen amLODipine (NORVASC) 5 MG tablet Take 5 mg daily by mouth.  Marland Kitchen aspirin EC 81 MG EC tablet Take 1 tablet (81 mg total) daily by mouth.  Marland Kitchen atorvastatin (LIPITOR) 80 MG tablet Take 1 tablet (80 mg total) daily by mouth.  . diazepam (VALIUM) 5 MG tablet Take 2.5-5 mg 3 (three) times daily as needed by mouth for anxiety.   Marland Kitchen glipiZIDE (GLUCOTROL) 10 MG tablet Take 10 mg 2 (two) times daily before a meal by mouth.   . hydrochlorothiazide (HYDRODIURIL) 25 MG tablet Take 25 mg daily by mouth.  Marland Kitchen lisinopril (PRINIVIL,ZESTRIL) 40 MG tablet Take 40 mg daily by mouth.  . metFORMIN (GLUCOPHAGE) 500 MG tablet Take 1 tablet (500 mg total) 2 (two) times daily with a meal by mouth. Resume on 03/03/17.  Marland Kitchen metoprolol succinate (TOPROL-XL) 100 MG 24 hr tablet Take 200 mg by mouth daily. Take with or immediately following a meal.   . nitroGLYCERIN (NITROSTAT) 0.4 MG SL tablet Place 1 tablet (0.4 mg total) every 5 (five) minutes as needed under the tongue for chest pain.     Allergies:   Patient has no known allergies.   Social History   Socioeconomic History  . Marital status: Divorced    Spouse name:  Not on file  . Number of children: Not on file  . Years of education: Not on file  . Highest education level: Not on file  Occupational History  . Not on file  Social Needs  . Financial resource strain: Not on file  . Food insecurity:    Worry: Not on file    Inability: Not on file  . Transportation needs:    Medical: Not on file    Non-medical: Not on file  Tobacco Use  . Smoking status: Never Smoker  . Smokeless tobacco: Never Used  Substance and Sexual Activity  . Alcohol use: Yes    Comment: weekly  . Drug use: No  . Sexual activity: Not on file  Lifestyle  . Physical activity:    Days per  week: Not on file    Minutes per session: Not on file  . Stress: Not on file  Relationships  . Social connections:    Talks on phone: Not on file    Gets together: Not on file    Attends religious service: Not on file    Active member of club or organization: Not on file    Attends meetings of clubs or organizations: Not on file    Relationship status: Not on file  Other Topics Concern  . Not on file  Social History Narrative  . Not on file     Family History: The patient's family history includes Hypertension in his father and mother. ROS:   Please see the history of present illness.    All 14 point review of systems negative except as described per history of present illness  EKGs/Labs/Other Studies Reviewed:      Recent Labs: 02/12/2018: ALT 35; BUN 16; Creatinine, Ser 1.03; Hemoglobin 15.8; Platelets 189; Potassium 3.6; Sodium 134  Recent Lipid Panel    Component Value Date/Time   CHOL 191 02/28/2017 0017   TRIG 288 (H) 02/28/2017 0017   HDL 52 02/28/2017 0017   CHOLHDL 3.7 02/28/2017 0017   VLDL 58 (H) 02/28/2017 0017   LDLCALC 81 02/28/2017 0017    Physical Exam:    VS:  BP (!) 154/64   Pulse (!) 115   Ht 5\' 7"  (1.702 m)   Wt 225 lb (102.1 kg)   SpO2 98%   BMI 35.24 kg/m     Wt Readings from Last 3 Encounters:  02/14/18 225 lb (102.1 kg)  02/12/18 231 lb 7.7 oz (105 kg)  04/29/17 233 lb (105.7 kg)     GEN:  Well nourished, well developed in no acute distress HEENT: Normal NECK: No JVD; No carotid bruits LYMPHATICS: No lymphadenopathy CARDIAC: RRR, no murmurs, no rubs, no gallops RESPIRATORY:  Clear to auscultation without rales, wheezing or rhonchi  ABDOMEN: Soft, non-tender, non-distended MUSCULOSKELETAL:  No edema; No deformity  SKIN: Warm and dry LOWER EXTREMITIES: no swelling NEUROLOGIC:  Alert and oriented x 3 PSYCHIATRIC:  Normal affect   ASSESSMENT:    1. Coronary artery disease involving native coronary artery of native heart  without angina pectoris   2. Essential hypertension   3. Dyslipidemia   4. Type 2 diabetes mellitus with complication, without long-term current use of insulin (HCC)   5. Supraventricular tachycardia (HCC)    PLAN:    In order of problems listed above:  1. Paroxysmal supraventricular tachycardia look like this is AV nodal reentry tachycardia.  We discussed in length about this situation.  I advised him to increase dose of metoprolol succinate he takes  100 mg for now we will go to 150 however asking to get EKG first.  Also told him if this maneuver will not take care of his arrhythmia we could consider ablation and I described to him procedure of ablation.  Obviously very concerning is the fact that he have abnormal troponin I.  Most likely is related to supraventricular tachycardia however, with his coronary artery disease history I will ask him to have a stress test.  We will do exercise Cardiolite. 2. Essential hypertension blood pressure well controlled continue present management. 3. Dyslipidemia he is on high intensity statin takes Lipitor 80 which I will continue last LDL done last year which was 50 I will ask him to have fasting lipid profile done.  Also today we will do thyroid profile.   Medication Adjustments/Labs and Tests Ordered: Current medicines are reviewed at length with the patient today.  Concerns regarding medicines are outlined above.  No orders of the defined types were placed in this encounter.  Medication changes: No orders of the defined types were placed in this encounter.   Signed, Georgeanna Lea, MD, [facility] 02/14/2018 2:52 PM    [facility]

## 2018-02-17 ENCOUNTER — Telehealth: Payer: Self-pay | Admitting: Cardiology

## 2018-02-17 NOTE — Telephone Encounter (Signed)
Patient states doctor put him back on metoprolol and he says he is on three heart medications and concerned of interaction.

## 2018-02-17 NOTE — Telephone Encounter (Signed)
Left message for patient to return call.

## 2018-02-20 NOTE — Telephone Encounter (Signed)
Left second message for patient to return call.

## 2018-02-20 NOTE — Telephone Encounter (Signed)
Patient reported stomach pain previously that has subsided now. He thinks it may have been a side effect of all of his medications. He will verify with pharmacist and will let us know if his symptoms return.

## 2018-02-25 ENCOUNTER — Ambulatory Visit: Payer: Medicare Other | Admitting: Cardiology

## 2018-02-25 ENCOUNTER — Telehealth (HOSPITAL_COMMUNITY): Payer: Self-pay

## 2018-02-25 NOTE — Telephone Encounter (Signed)
Detailed message left for the pt. Asked to call back with any questions. S.Keilen Kahl EMTP 

## 2018-02-27 ENCOUNTER — Ambulatory Visit (HOSPITAL_COMMUNITY): Payer: Medicare Other | Attending: Cardiology

## 2018-02-27 VITALS — Ht 67.0 in | Wt 225.0 lb

## 2018-02-27 DIAGNOSIS — I471 Supraventricular tachycardia: Secondary | ICD-10-CM | POA: Diagnosis not present

## 2018-02-27 DIAGNOSIS — I2510 Atherosclerotic heart disease of native coronary artery without angina pectoris: Secondary | ICD-10-CM

## 2018-02-27 DIAGNOSIS — I214 Non-ST elevation (NSTEMI) myocardial infarction: Secondary | ICD-10-CM | POA: Insufficient documentation

## 2018-02-27 MED ORDER — TECHNETIUM TC 99M TETROFOSMIN IV KIT
32.6000 | PACK | Freq: Once | INTRAVENOUS | Status: AC | PRN
  Administered 2018-02-27: 32.6 via INTRAVENOUS
  Filled 2018-02-27: qty 33

## 2018-03-03 ENCOUNTER — Ambulatory Visit (HOSPITAL_COMMUNITY): Payer: Medicare Other | Attending: Cardiology

## 2018-03-03 DIAGNOSIS — I214 Non-ST elevation (NSTEMI) myocardial infarction: Secondary | ICD-10-CM | POA: Insufficient documentation

## 2018-03-03 DIAGNOSIS — I2510 Atherosclerotic heart disease of native coronary artery without angina pectoris: Secondary | ICD-10-CM | POA: Diagnosis not present

## 2018-03-03 LAB — MYOCARDIAL PERFUSION IMAGING
CHL CUP RESTING HR STRESS: 103 {beats}/min
LV sys vol: 17 mL
LVDIAVOL: 55 mL (ref 62–150)
Peak HR: 120 {beats}/min
SDS: 4
SRS: 0
SSS: 4
TID: 0.81

## 2018-03-03 MED ORDER — REGADENOSON 0.4 MG/5ML IV SOLN
0.4000 mg | Freq: Once | INTRAVENOUS | Status: AC
  Administered 2018-03-03: 0.4 mg via INTRAVENOUS

## 2018-03-03 MED ORDER — TECHNETIUM TC 99M TETROFOSMIN IV KIT
33.0000 | PACK | Freq: Once | INTRAVENOUS | Status: AC | PRN
  Administered 2018-03-03: 33 via INTRAVENOUS
  Filled 2018-03-03: qty 33

## 2018-03-18 ENCOUNTER — Encounter: Payer: Self-pay | Admitting: Cardiology

## 2018-03-18 ENCOUNTER — Ambulatory Visit (INDEPENDENT_AMBULATORY_CARE_PROVIDER_SITE_OTHER): Payer: Medicare Other | Admitting: Cardiology

## 2018-03-18 VITALS — BP 120/68 | HR 100 | Ht 67.0 in | Wt 227.8 lb

## 2018-03-18 DIAGNOSIS — I2510 Atherosclerotic heart disease of native coronary artery without angina pectoris: Secondary | ICD-10-CM

## 2018-03-18 DIAGNOSIS — I471 Supraventricular tachycardia: Secondary | ICD-10-CM

## 2018-03-18 DIAGNOSIS — E785 Hyperlipidemia, unspecified: Secondary | ICD-10-CM | POA: Diagnosis not present

## 2018-03-18 DIAGNOSIS — E118 Type 2 diabetes mellitus with unspecified complications: Secondary | ICD-10-CM

## 2018-03-18 MED ORDER — DILTIAZEM HCL ER COATED BEADS 120 MG PO CP24: 120.0000 mg | ORAL_CAPSULE | Freq: Every day | ORAL | 0 refills | Status: DC

## 2018-03-18 NOTE — Progress Notes (Signed)
Cardiology Office Note:    Date:  03/18/2018   ID:  Tyler Ellis, DOB Aug 14, 1950, MRN [id number]  PCP:  Forrest Moron, MD  Cardiologist:  Gypsy Balsam, MD    Referring MD: Forrest Moron, MD   Chief Complaint  Patient presents with  . 1 month follow up  Doing much better but still gets some episode of tachycardia  History of Present Illness:    Tyler Ellis is a 67 y.o. male with coronary artery disease status post non-STEMI year ago.  Comes today to office for follow-up another issue is palpitations he was diagnosed with supraventricular tachycardia.  Recently increased dose of his metoprolol now he takes 150 mg daily described the fact feeling much better but still has some episode of palpitations this time lasting may be for a few seconds.  Still somewhat concerned about embarrassing some.  We talked about options for this situation option being increasing medications or even consider ablation he said he is thinking about increasing medications we can control it.  I will ask him to have EKG today and based on that decide which medication to increase  Past Medical History:  Diagnosis Date  . Coronary artery disease   . Diabetes mellitus without complication (HCC)   . Hypertension     Past Surgical History:  Procedure Laterality Date  . KNEE SURGERY    . LEFT HEART CATH AND CORONARY ANGIOGRAPHY N/A 02/28/2017   Procedure: LEFT HEART CATH AND CORONARY ANGIOGRAPHY;  Surgeon: Kathleene Hazel, MD;  Location: [facility];  Service: Cardiovascular;  Laterality: N/A;    Current Medications: Current Meds  Medication Sig  . acetaminophen-codeine (TYLENOL #3) 300-30 MG tablet Take 1 tablet every 8 (eight) hours as needed by mouth for moderate pain.   Marland Kitchen amLODipine (NORVASC) 5 MG tablet Take 5 mg daily by mouth.  Marland Kitchen aspirin EC 81 MG EC tablet Take 1 tablet (81 mg total) daily by mouth.  Marland Kitchen atorvastatin (LIPITOR) 80 MG tablet Take 1 tablet (80 mg total) daily by mouth.    . diazepam (VALIUM) 5 MG tablet Take 2.5-5 mg 3 (three) times daily as needed by mouth for anxiety.   Marland Kitchen glipiZIDE (GLUCOTROL) 10 MG tablet Take 10 mg 2 (two) times daily before a meal by mouth.   . hydrochlorothiazide (HYDRODIURIL) 25 MG tablet Take 25 mg daily by mouth.  Marland Kitchen lisinopril (PRINIVIL,ZESTRIL) 40 MG tablet Take 40 mg daily by mouth.  . metFORMIN (GLUCOPHAGE) 500 MG tablet Take 1 tablet (500 mg total) 2 (two) times daily with a meal by mouth. Resume on 03/03/17.  Marland Kitchen metoprolol succinate (TOPROL-XL) 50 MG 24 hr tablet TAKE 3 TABLETS BY MOUTH DAILY WITH OR IMMEDIATELY FOLLOWING A MEAL  . metoprolol succinate (TOPROL-XL) 50 MG 24 hr tablet TAKE 3 TABLETS BY MOUTH DAILY WITH OR IMMEDIATELY FOLLOWING A MEAL  . nitroGLYCERIN (NITROSTAT) 0.4 MG SL tablet Place 1 tablet (0.4 mg total) every 5 (five) minutes as needed under the tongue for chest pain.     Allergies:   Patient has no known allergies.   Social History   Socioeconomic History  . Marital status: Divorced    Spouse name: Not on file  . Number of children: Not on file  . Years of education: Not on file  . Highest education level: Not on file  Occupational History  . Not on file  Social Needs  . Financial resource strain: Not on file  . Food insecurity:    Worry: Not on  file    Inability: Not on file  . Transportation needs:    Medical: Not on file    Non-medical: Not on file  Tobacco Use  . Smoking status: Never Smoker  . Smokeless tobacco: Never Used  Substance and Sexual Activity  . Alcohol use: Yes    Comment: weekly  . Drug use: No  . Sexual activity: Not on file  Lifestyle  . Physical activity:    Days per week: Not on file    Minutes per session: Not on file  . Stress: Not on file  Relationships  . Social connections:    Talks on phone: Not on file    Gets together: Not on file    Attends religious service: Not on file    Active member of club or organization: Not on file    Attends meetings of clubs  or organizations: Not on file    Relationship status: Not on file  Other Topics Concern  . Not on file  Social History Narrative  . Not on file     Family History: The patient's family history includes Hypertension in his father and mother. ROS:   Please see the history of present illness.    All 14 point review of systems negative except as described per history of present illness  EKGs/Labs/Other Studies Reviewed:      Recent Labs: 02/12/2018: ALT 35; BUN 16; Creatinine, Ser 1.03; Hemoglobin 15.8; Platelets 189; Potassium 3.6; Sodium 134  Recent Lipid Panel    Component Value Date/Time   CHOL 191 02/28/2017 0017   TRIG 288 (H) 02/28/2017 0017   HDL 52 02/28/2017 0017   CHOLHDL 3.7 02/28/2017 0017   VLDL 58 (H) 02/28/2017 0017   LDLCALC 81 02/28/2017 0017    Physical Exam:    VS:  BP 120/68   Pulse 100   Ht 5\' 7"  (1.702 m)   Wt 227 lb 12.8 oz (103.3 kg)   SpO2 96%   BMI 35.68 kg/m     Wt Readings from Last 3 Encounters:  03/18/18 227 lb 12.8 oz (103.3 kg)  02/27/18 225 lb (102.1 kg)  02/14/18 225 lb (102.1 kg)     GEN:  Well nourished, well developed in no acute distress HEENT: Normal NECK: No JVD; No carotid bruits LYMPHATICS: No lymphadenopathy CARDIAC: RRR, no murmurs, no rubs, no gallops RESPIRATORY:  Clear to auscultation without rales, wheezing or rhonchi  ABDOMEN: Soft, non-tender, non-distended MUSCULOSKELETAL:  No edema; No deformity  SKIN: Warm and dry LOWER EXTREMITIES: no swelling NEUROLOGIC:  Alert and oriented x 3 PSYCHIATRIC:  Normal affect   ASSESSMENT:    1. Supraventricular tachycardia (HCC)   2. Coronary artery disease involving native coronary artery of native heart without angina pectoris   3. Dyslipidemia   4. Type 2 diabetes mellitus with complication, without long-term current use of insulin (HCC)    PLAN:    In order of problems listed above:  1. Supraventricular tachycardia plan as outlined above 2. Coronary artery  disease doing well from that point review we will continue present management. 3. Dyslipidemia on high intensity statin which I will continue.  Last fasting lipid profile showed LDL of 81 at that time we increased dose of Lipitor.  Will recheck fasting lipid profile 4. X2 diabetes doing well from that point review.   Medication Adjustments/Labs and Tests Ordered: Current medicines are reviewed at length with the patient today.  Concerns regarding medicines are outlined above.  No orders of the  defined types were placed in this encounter.  Medication changes: No orders of the defined types were placed in this encounter.   Signed, Georgeanna Lea, MD, [facility] 03/18/2018 3:51 PM    [facility]

## 2018-03-18 NOTE — Patient Instructions (Addendum)
Medication Instructions:  Your physician has recommended you make the following change in your medication:   STOP: Norvasc  START: Cardizem 120mg  daily.   If you need a refill on your cardiac medications before your next appointment, please call your pharmacy.   Lab work: None.  If you have labs (blood work) drawn today and your tests are completely normal, you will receive your results only by: Marland Kitchen MyChart Message (if you have MyChart) OR . A paper copy in the mail If you have any lab test that is abnormal or we need to change your treatment, we will call you to review the results.  Testing/Procedures: None.   Follow-Up: At [facility], you and your health needs are our priority.  As part of our continuing mission to provide you with exceptional heart care, we have created designated Provider Care Teams.  These Care Teams include your primary Cardiologist (physician) and Advanced Practice Providers (APPs -  Physician Assistants and Nurse Practitioners) who all work together to provide you with the care you need, when you need it. You will need a follow up appointment in 3 months.  Please call our office 2 months in advance to schedule this appointment.  You may see No primary care provider on file. or another member of our [employer] in [place]: Gypsy Balsam, MD . Belva Crome, MD  Any Other Special Instructions Will Be Listed Below (If Applicable).   Diltiazem tablets What is this medicine? DILTIAZEM (dil TYE a zem) is a calcium-channel blocker. It affects the amount of calcium found in your heart and muscle cells. This relaxes your blood vessels, which can reduce the amount of work the heart has to do. This medicine is used to treat chest pain caused by angina. This medicine may be used for other purposes; ask your health care provider or pharmacist if you have questions. COMMON BRAND NAME(S): Cardizem What should I tell my health care provider before  I take this medicine? They need to know if you have any of these conditions: -heart problems, low blood pressure, irregular heartbeat -liver disease -previous heart attack -an unusual or allergic reaction to diltiazem, other medicines, foods, dyes, or preservatives -pregnant or trying to get pregnant -breast-feeding How should I use this medicine? Take this medicine by mouth with a glass of water. Follow the directions on the prescription label. Do not cut, crush or chew this medicine. This medicine is usually taken before meals and at bedtime. Take your doses at regular intervals. Do not take your medicine more often then directed. Do not stop taking except on the advice of your doctor or health care professional. Talk to your pediatrician regarding the use of this medicine in children. Special care may be needed. Overdosage: If you think you have taken too much of this medicine contact a poison control center or emergency room at once. NOTE: This medicine is only for you. Do not share this medicine with others. What if I miss a dose? If you miss a dose, take it as soon as you can. If it is almost time for your next dose, take only that dose. Do not take double or extra doses. What may interact with this medicine? Do not take this medicine with any of the following: -cisapride -hawthorn -pimozide -ranolazine -red yeast rice This medicine may also interact with the following medications: -buspirone -carbamazepine -cimetidine -cyclosporine -digoxin -local anesthetics or general anesthetics -lovastatin -medicines for anxiety or difficulty sleeping like midazolam and triazolam -  medicines for high blood pressure or heart problems -quinidine -rifampin, rifabutin, or rifapentine This list may not describe all possible interactions. Give your health care provider a list of all the medicines, herbs, non-prescription drugs, or dietary supplements you use. Also tell them if you smoke, drink  alcohol, or use illegal drugs. Some items may interact with your medicine. What should I watch for while using this medicine? Check your blood pressure and pulse rate regularly. Ask your doctor or health care professional what your blood pressure and pulse rate should be and when you should contact him or her. You may feel dizzy or lightheaded. Do not drive, use machinery, or do anything that needs mental alertness until you know how this medicine affects you. To reduce the risk of dizzy or fainting spells, do not sit or stand up quickly, especially if you are an older patient. Alcohol can make you more dizzy or increase flushing and rapid heartbeats. Avoid alcoholic drinks. What side effects may I notice from receiving this medicine? Side effects that you should report to your doctor or health care professional as soon as possible: -allergic reactions like skin rash, itching or hives, swelling of the face, lips, or tongue -confusion, mental depression -feeling faint or lightheaded, falls -pinpoint red spots on the skin -redness, blistering, peeling or loosening of the skin, including inside the mouth -slow, irregular heartbeat -swelling of the ankles, feet -unusual bleeding or bruising Side effects that usually do not require medical attention (report to your doctor or health care professional if they continue or are bothersome): -change in sex drive or performance -constipation or diarrhea -flushing of the face -headache -nausea, vomiting -tired or weak -trouble sleeping This list may not describe all possible side effects. Call your doctor for medical advice about side effects. You may report side effects to FDA at 1-800-FDA-1088. Where should I keep my medicine? Keep out of the reach of children. Store at room temperature between 20 and 25 degrees C (68 and 77 degrees F). Protect from light. Keep container tightly closed. Throw away any unused medicine after the expiration date. NOTE:  This sheet is a summary. It may not cover all possible information. If you have questions about this medicine, talk to your doctor, pharmacist, or health care provider.  2018 Elsevier/Gold Standard (2013-03-23 10:54:31)

## 2018-03-27 ENCOUNTER — Telehealth: Payer: Self-pay | Admitting: Cardiology

## 2018-03-27 NOTE — Telephone Encounter (Addendum)
Patient recently started on diltiazem 120 mg daily for his history of svt. He reports "not feeling right, like he has been drugged" an hour after taking this medication. He wasn't able to describe this any differently than as previously mentioned. He reports also having palpitations after taking the medication, this only last for a few minutes. He has no way to check his pulse or blood pressure at home, advised him to look into this. Will route to Dr. Dulce SellarMunley for recommendations.

## 2018-03-27 NOTE — Telephone Encounter (Signed)
Very uncommon reaction but I think he should stop diltiazem give no new drugs at this time

## 2018-03-27 NOTE — Addendum Note (Signed)
Addended by: Lita MainsBOWMAN, Jocelyn Nold R on: 03/27/2018 05:04 PM   Modules accepted: Orders

## 2018-03-27 NOTE — Telephone Encounter (Signed)
Patient is having issues with diltiazem, needs adice. Patient cannot function for two or three hours after taking.

## 2018-03-27 NOTE — Telephone Encounter (Signed)
Patient advised to stop taking diltiazem and to call us back with any other concerns or issues. Patient verbally understands.

## 2018-05-05 ENCOUNTER — Telehealth: Payer: Self-pay | Admitting: Student

## 2018-05-05 NOTE — Telephone Encounter (Signed)
   Received page from answering service about rapid heart rate. Patient has a history of SVT. Patient apparently scheduled for colonoscopy at 7am. Attempted to call patient at number given but patient did not answer and I was unable to leave message.  Corrin Parker, PA-C 05/05/2018 7:12 AM

## 2018-06-10 ENCOUNTER — Encounter: Payer: Self-pay | Admitting: Cardiology

## 2018-06-10 ENCOUNTER — Ambulatory Visit: Payer: Medicare Other | Admitting: Cardiology

## 2018-06-10 VITALS — BP 140/80 | HR 104 | Ht 67.0 in | Wt 233.4 lb

## 2018-06-10 DIAGNOSIS — E118 Type 2 diabetes mellitus with unspecified complications: Secondary | ICD-10-CM | POA: Diagnosis not present

## 2018-06-10 DIAGNOSIS — E785 Hyperlipidemia, unspecified: Secondary | ICD-10-CM

## 2018-06-10 DIAGNOSIS — I471 Supraventricular tachycardia, unspecified: Secondary | ICD-10-CM

## 2018-06-10 DIAGNOSIS — I2510 Atherosclerotic heart disease of native coronary artery without angina pectoris: Secondary | ICD-10-CM

## 2018-06-10 DIAGNOSIS — I10 Essential (primary) hypertension: Secondary | ICD-10-CM

## 2018-06-10 NOTE — Patient Instructions (Addendum)
Medication Instructions:  Your physician recommends that you continue on your current medications as directed. Please refer to the Current Medication list given to you today.  If you need a refill on your cardiac medications before your next appointment, please call your pharmacy.   Lab work: None.   If you have labs (blood work) drawn today and your tests are completely normal, you will receive your results only by: . MyChart Message (if you have MyChart) OR . A paper copy in the mail If you have any lab test that is abnormal or we need to change your treatment, we will call you to review the results.  Testing/Procedures: Your physician has requested that you have an echocardiogram. Echocardiography is a painless test that uses sound waves to create images of your heart. It provides your doctor with information about the size and shape of your heart and how well your heart's chambers and valves are working. This procedure takes approximately one hour. There are no restrictions for this procedure.    Follow-Up: At [facility], you and your health needs are our priority.  As part of our continuing mission to provide you with exceptional heart care, we have created designated Provider Care Teams.  These Care Teams include your primary Cardiologist (physician) and Advanced Practice Providers (APPs -  Physician Assistants and Nurse Practitioners) who all work together to provide you with the care you need, when you need it. You will need a follow up appointment in 5 months.  Please call our office 2 months in advance to schedule this appointment.  You may see No primary care provider on file. or another member of our [facility] Provider Team in [place]: Brian Munley, MD . Rajan Revankar, MD  Any Other Special Instructions Will Be Listed Below (If Applicable).   Echocardiogram An echocardiogram is a procedure that uses painless sound waves (ultrasound) to produce an image of the  heart. Images from an echocardiogram can provide important information about:  Signs of coronary artery disease (CAD).  Aneurysm detection. An aneurysm is a weak or damaged part of an artery wall that bulges out from the normal force of blood pumping through the body.  Heart size and shape. Changes in the size or shape of the heart can be associated with certain conditions, including heart failure, aneurysm, and CAD.  Heart muscle function.  Heart valve function.  Signs of a past heart attack.  Fluid buildup around the heart.  Thickening of the heart muscle.  A tumor or infectious growth around the heart valves. Tell a health care provider about:  Any allergies you have.  All medicines you are taking, including vitamins, herbs, eye drops, creams, and over-the-counter medicines.  Any blood disorders you have.  Any surgeries you have had.  Any medical conditions you have.  Whether you are pregnant or may be pregnant. What are the risks? Generally, this is a safe procedure. However, problems may occur, including:  Allergic reaction to dye (contrast) that may be used during the procedure. What happens before the procedure? No specific preparation is needed. You may eat and drink normally. What happens during the procedure?   An IV tube may be inserted into one of your veins.  You may receive contrast through this tube. A contrast is an injection that improves the quality of the pictures from your heart.  A gel will be applied to your chest.  A wand-like tool (transducer) will be moved over your chest. The gel will   help to transmit the sound waves from the transducer.  The sound waves will harmlessly bounce off of your heart to allow the heart images to be captured in real-time motion. The images will be recorded on a computer. The procedure may vary among health care providers and hospitals. What happens after the procedure?  You may return to your normal, everyday  life, including diet, activities, and medicines, unless your health care provider tells you not to do that. Summary  An echocardiogram is a procedure that uses painless sound waves (ultrasound) to produce an image of the heart.  Images from an echocardiogram can provide important information about the size and shape of your heart, heart muscle function, heart valve function, and fluid buildup around your heart.  You do not need to do anything to prepare before this procedure. You may eat and drink normally.  After the echocardiogram is completed, you may return to your normal, everyday life, unless your health care provider tells you not to do that. This information is not intended to replace advice given to you by your health care provider. Make sure you discuss any questions you have with your health care provider. Document Released: 04/06/2000 Document Revised: 05/12/2016 Document Reviewed: 05/12/2016 Elsevier Interactive Patient Education  2019 Elsevier Inc.    

## 2018-06-10 NOTE — Progress Notes (Signed)
Cardiology Office Note:    Date:  06/10/2018   ID:  Tyler Ellis, DOB [birth year], MRN [id number]  PCP:  Forrest Moron, MD  Cardiologist:  Gypsy Balsam, MD    Referring MD: Forrest Moron, MD   Chief Complaint  Patient presents with  . Follow-up  Doing well  History of Present Illness:    Tyler Ellis is a 68 y.o. male with history of myocardial infarction, cardiac catheterization 2019 showing nonobstructive lesions.  Comes today to my office for follow-up overall doing well additional problem that he had a supraventricular tachycardia successfully managed with beta-blocker.  He is doing very well denies having a palpitations he still noted his heart rate being elevated.  He tries to exercise on the regular basis he lift some weights as well as try to walk and have no difficulty doing it.  No chest pain tightness squeezing pressure burning chest no dizziness no palpitations.  Past Medical History:  Diagnosis Date  . Coronary artery disease   . Diabetes mellitus without complication (HCC)   . Hypertension     Past Surgical History:  Procedure Laterality Date  . KNEE SURGERY    . LEFT HEART CATH AND CORONARY ANGIOGRAPHY N/A 02/28/2017   Procedure: LEFT HEART CATH AND CORONARY ANGIOGRAPHY;  Surgeon: Kathleene Hazel, MD;  Location: [facility];  Service: Cardiovascular;  Laterality: N/A;    Current Medications: Current Meds  Medication Sig  . acetaminophen-codeine (TYLENOL #3) 300-30 MG tablet Take 1 tablet every 8 (eight) hours as needed by mouth for moderate pain.   Marland Kitchen aspirin EC 81 MG EC tablet Take 1 tablet (81 mg total) daily by mouth.  Marland Kitchen atorvastatin (LIPITOR) 80 MG tablet Take 1 tablet (80 mg total) daily by mouth.  . diazepam (VALIUM) 5 MG tablet Take 2.5-5 mg 3 (three) times daily as needed by mouth for anxiety.   Marland Kitchen glipiZIDE (GLUCOTROL) 10 MG tablet Take 10 mg 2 (two) times daily before a meal by mouth.   . hydrochlorothiazide (HYDRODIURIL) 25 MG  tablet Take 25 mg daily by mouth.  Marland Kitchen lisinopril (PRINIVIL,ZESTRIL) 40 MG tablet Take 40 mg daily by mouth.  . metFORMIN (GLUCOPHAGE) 500 MG tablet Take 1 tablet (500 mg total) 2 (two) times daily with a meal by mouth. Resume on 03/03/17.  Marland Kitchen metoprolol succinate (TOPROL-XL) 50 MG 24 hr tablet TAKE 3 TABLETS BY MOUTH DAILY WITH OR IMMEDIATELY FOLLOWING A MEAL  . nitroGLYCERIN (NITROSTAT) 0.4 MG SL tablet Place 1 tablet (0.4 mg total) every 5 (five) minutes as needed under the tongue for chest pain.     Allergies:   Patient has no known allergies.   Social History   Socioeconomic History  . Marital status: Divorced    Spouse name: Not on file  . Number of children: Not on file  . Years of education: Not on file  . Highest education level: Not on file  Occupational History  . Not on file  Social Needs  . Financial resource strain: Not on file  . Food insecurity:    Worry: Not on file    Inability: Not on file  . Transportation needs:    Medical: Not on file    Non-medical: Not on file  Tobacco Use  . Smoking status: Never Smoker  . Smokeless tobacco: Never Used  Substance and Sexual Activity  . Alcohol use: Yes    Comment: weekly  . Drug use: No  . Sexual activity: Not on file  Lifestyle  .  Physical activity:    Days per week: Not on file    Minutes per session: Not on file  . Stress: Not on file  Relationships  . Social connections:    Talks on phone: Not on file    Gets together: Not on file    Attends religious service: Not on file    Active member of club or organization: Not on file    Attends meetings of clubs or organizations: Not on file    Relationship status: Not on file  Other Topics Concern  . Not on file  Social History Narrative  . Not on file     Family History: The patient's family history includes Hypertension in his father and mother. ROS:   Please see the history of present illness.    All 14 point review of systems negative except as described  per history of present illness  EKGs/Labs/Other Studies Reviewed:      Recent Labs: 02/12/2018: ALT 35; BUN 16; Creatinine, Ser 1.03; Hemoglobin 15.8; Platelets 189; Potassium 3.6; Sodium 134  Recent Lipid Panel    Component Value Date/Time   CHOL 191 02/28/2017 0017   TRIG 288 (H) 02/28/2017 0017   HDL 52 02/28/2017 0017   CHOLHDL 3.7 02/28/2017 0017   VLDL 58 (H) 02/28/2017 0017   LDLCALC 81 02/28/2017 0017    Physical Exam:    VS:  BP 140/80   Pulse (!) 104   Ht 5\' 7"  (1.702 m)   Wt 233 lb 6.4 oz (105.9 kg)   SpO2 98%   BMI 36.56 kg/m     Wt Readings from Last 3 Encounters:  06/10/18 233 lb 6.4 oz (105.9 kg)  03/18/18 227 lb 12.8 oz (103.3 kg)  02/27/18 225 lb (102.1 kg)     GEN:  Well nourished, well developed in no acute distress HEENT: Normal NECK: No JVD; No carotid bruits LYMPHATICS: No lymphadenopathy CARDIAC: RRR, no murmurs, no rubs, no gallops RESPIRATORY:  Clear to auscultation without rales, wheezing or rhonchi  ABDOMEN: Soft, non-tender, non-distended MUSCULOSKELETAL:  No edema; No deformity  SKIN: Warm and dry LOWER EXTREMITIES: no swelling NEUROLOGIC:  Alert and oriented x 3 PSYCHIATRIC:  Normal affect   ASSESSMENT:    1. Supraventricular tachycardia (HCC)   2. Coronary artery disease involving native coronary artery of native heart without angina pectoris   3. Type 2 diabetes mellitus with complication, without long-term current use of insulin (HCC)   4. Dyslipidemia   5. Essential hypertension    PLAN:    In order of problems listed above:  1. Supraventricular tachycardia successfully suppressed with beta-blocker which I will continue. 2. Coronary artery disease on appropriate medication which include aspirin as well as high intensity statin which I will continue.  I will ask him to have EKG today.  He will also will be scheduled to have echocardiogram to reassess his left ventricular ejection fraction. 3. Type 2 diabetes apparently  lately his diabetes became somewhat out-of-control his primary care physician is taking care of that some adjustment to his medication has been made. 4. Dyslipidemia he is taking high intensity statin Lipitor 80 he just had cholesterol checked last week and he was told everything is fine I will call his primary care office to get a report of fasting lipid profile. 5. Essential hypertension blood pressure well controlled we will continue present management.   Medication Adjustments/Labs and Tests Ordered: Current medicines are reviewed at length with the patient today.  Concerns regarding medicines  are outlined above.  No orders of the defined types were placed in this encounter.  Medication changes: No orders of the defined types were placed in this encounter.   Signed, Georgeanna Leaobert J. Raynald Rouillard, MD, [facility] 06/10/2018 9:31 AM    [facility]

## 2018-06-18 ENCOUNTER — Ambulatory Visit (HOSPITAL_BASED_OUTPATIENT_CLINIC_OR_DEPARTMENT_OTHER)
Admission: RE | Admit: 2018-06-18 | Discharge: 2018-06-18 | Disposition: A | Discharge status: Home / Self Care | Payer: Medicare Other | Source: Ambulatory Visit | Attending: Cardiology | Admitting: Cardiology

## 2018-06-18 DIAGNOSIS — I2510 Atherosclerotic heart disease of native coronary artery without angina pectoris: Secondary | ICD-10-CM | POA: Diagnosis present

## 2018-06-18 DIAGNOSIS — I471 Supraventricular tachycardia: Secondary | ICD-10-CM

## 2018-06-18 DIAGNOSIS — I10 Essential (primary) hypertension: Secondary | ICD-10-CM | POA: Diagnosis present

## 2018-06-18 NOTE — Progress Notes (Signed)
  Echocardiogram 2D Echocardiogram has been performed.  Saje Gallop T Kavan Devan 06/18/2018, 8:53 AM

## 2018-06-26 ENCOUNTER — Telehealth: Payer: Self-pay | Admitting: Cardiology

## 2018-06-26 NOTE — Telephone Encounter (Signed)
Called patient back to relay results but left message on home voicemail.

## 2018-06-26 NOTE — Telephone Encounter (Signed)
Says he got an Echo and has not heard back regarding the results,  He is requesting a call back with results

## 2018-06-27 NOTE — Telephone Encounter (Signed)
The patient has been notified of the result and verbalized understanding.  Patient states he plans to purchase a home BP machine to keep a closer eye on his BP's. Patient also concerned because he uses stationary bike at home but recently started walking and becomes winded. RN instructed patient to start off with shorter walks and work his way up to longer distances.RN expressed that if SOB or chest pain reoccurs to make appt to see Dr. Kirtland Bouchard.

## 2018-07-10 ENCOUNTER — Other Ambulatory Visit: Payer: Self-pay | Admitting: Cardiology

## 2018-07-20 IMAGING — CT CT ANGIO CHEST
2 of 9 series · 15 of 46 positions shown · IV contrast (APPLIED)
Comparison: None.

CLINICAL DATA: Chest pain since early this morning, elevated
troponin level. History of hypertension, diabetes.

EXAM:
CT ANGIOGRAPHY CHEST, ABDOMEN AND PELVIS
TECHNIQUE: Multidetector CT imaging through the chest, abdomen and pelvis was
performed using the standard protocol during bolus administration of
intravenous contrast. Multiplanar reconstructed images and MIPs were
obtained and reviewed to evaluate the vascular anatomy.
CONTRAST:  100 cc Isovue 370

[Series 6: axial arterial · axial · arterial · 0.86mm/px · z∈[-590,-14]mm · 12 of 217 slices shown]
[im 13/217  lung]
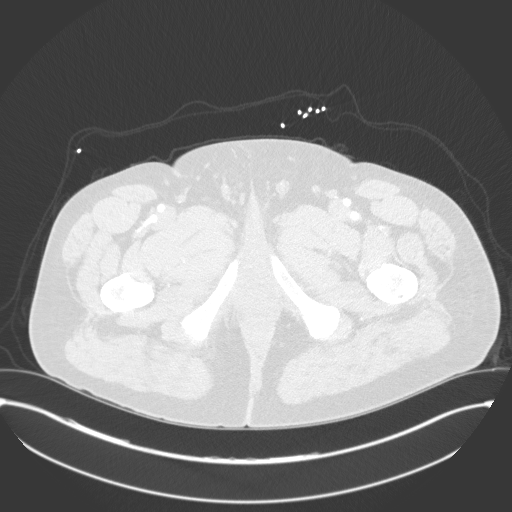
[im 37/217  soft-tissue]
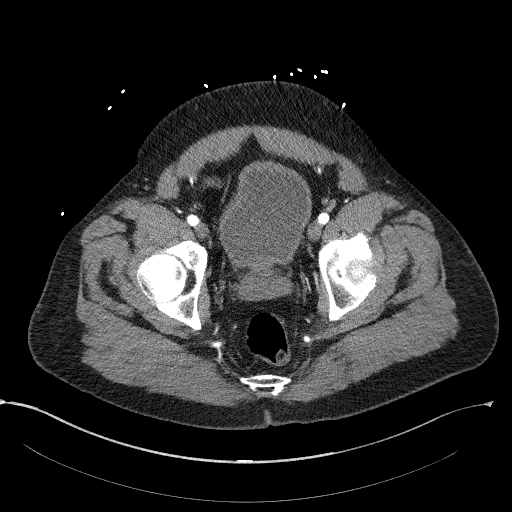
[im 49/217  lung]
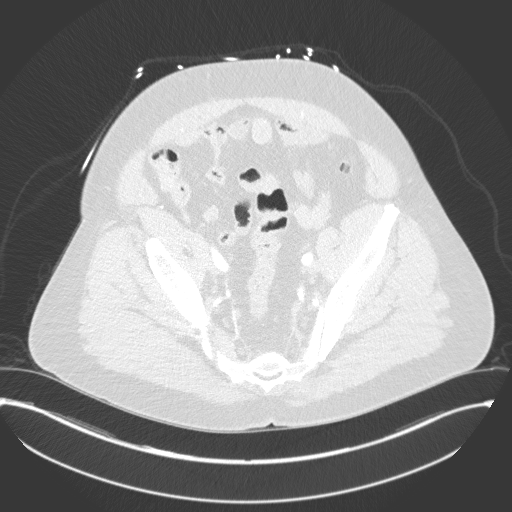
[im 61/217  soft-tissue]
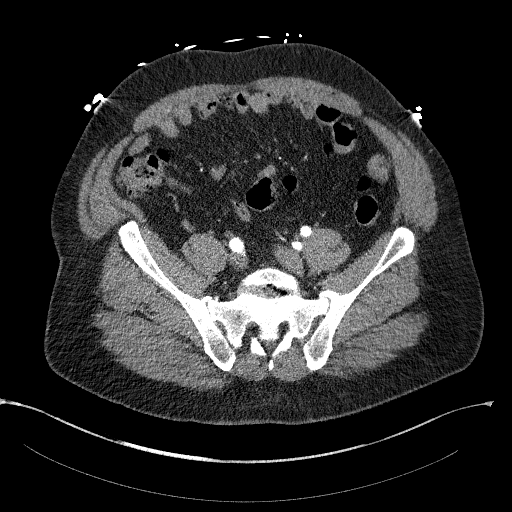
[im 85/217  lung]
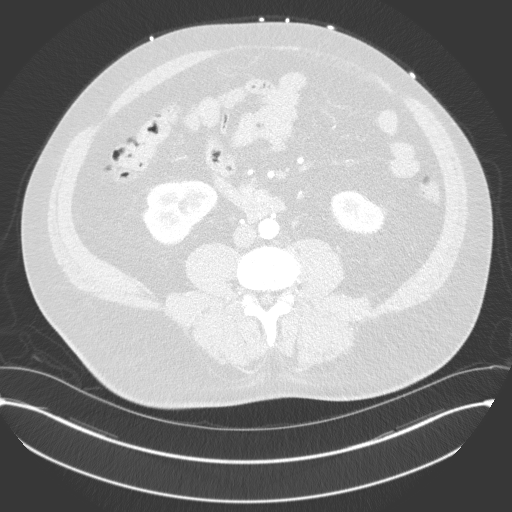
[im 97/217  soft-tissue]
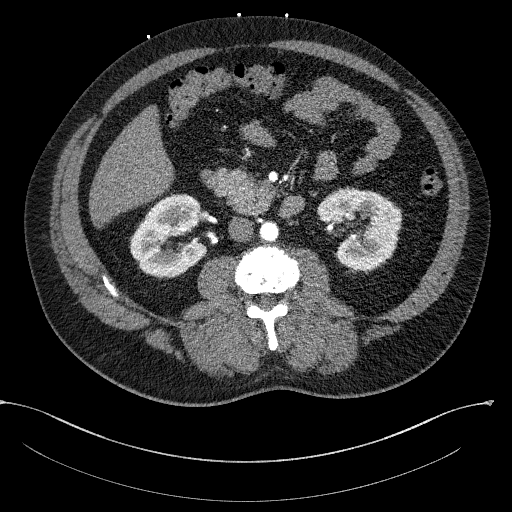
[im 121/217  lung]
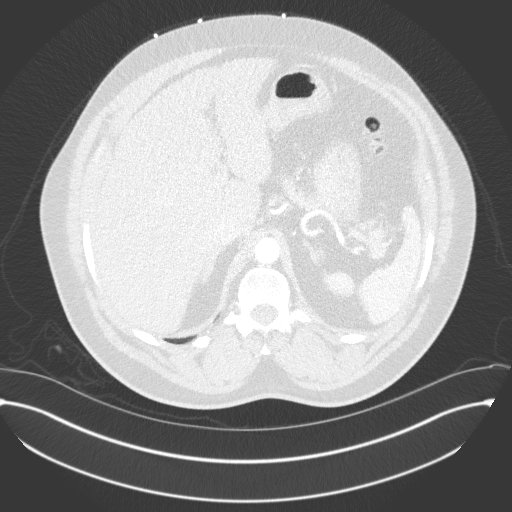
[im 133/217  soft-tissue]
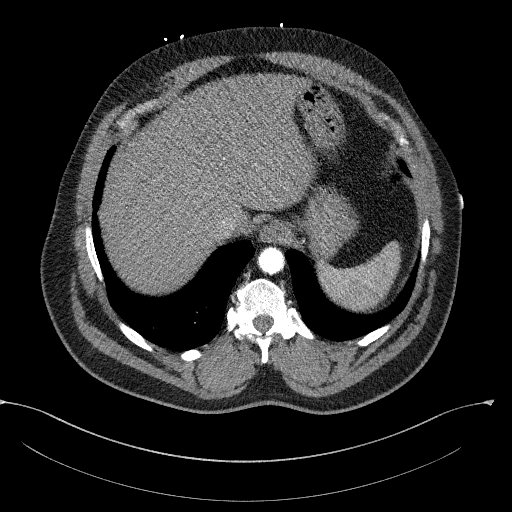
[im 157/217  lung]
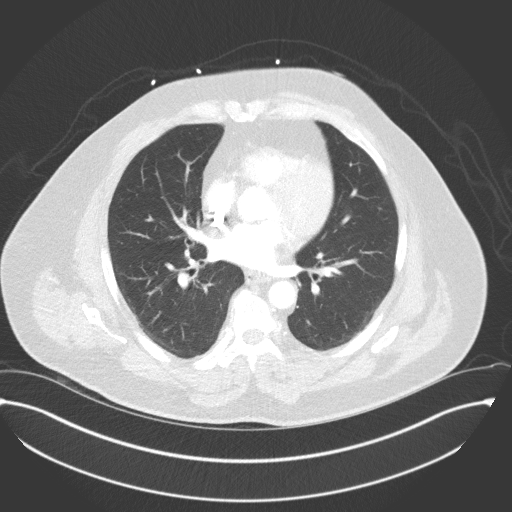
[im 169/217  soft-tissue]
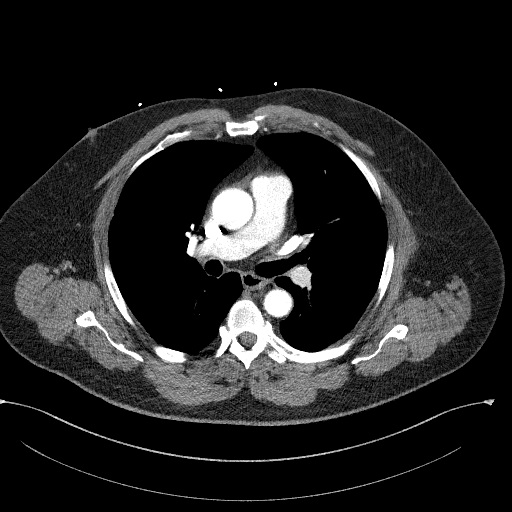
[im 181/217  lung]
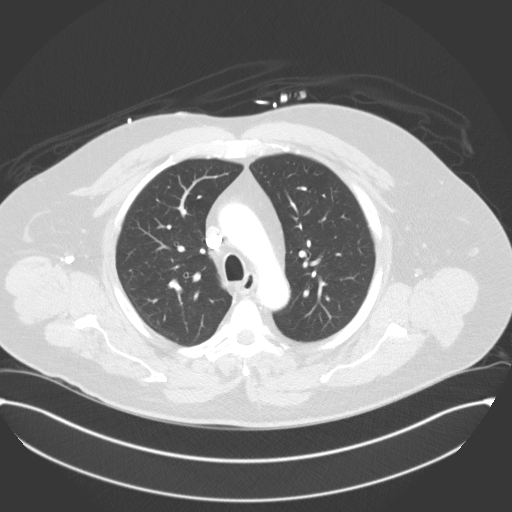
[im 205/217  soft-tissue]
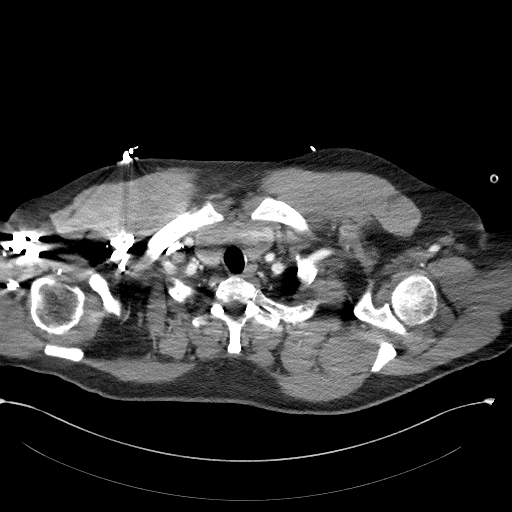

[Series 8: coronals · coronal · 0.81mm/px · 3 of 186 slices shown]
[im 47/186  soft-tissue]
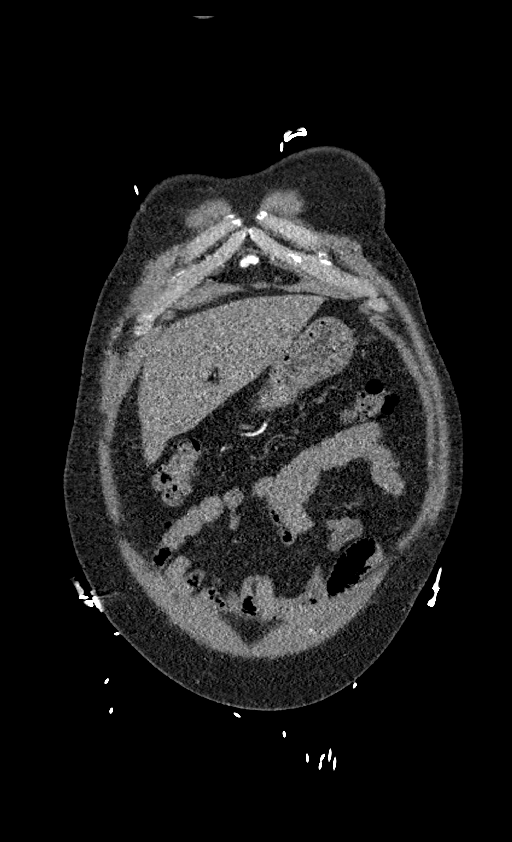
[im 93/186  soft-tissue]
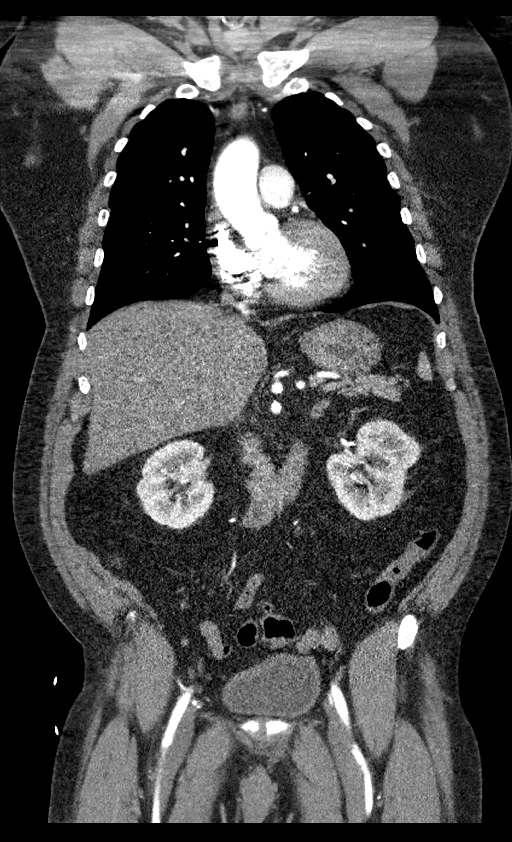
[im 139/186  soft-tissue]
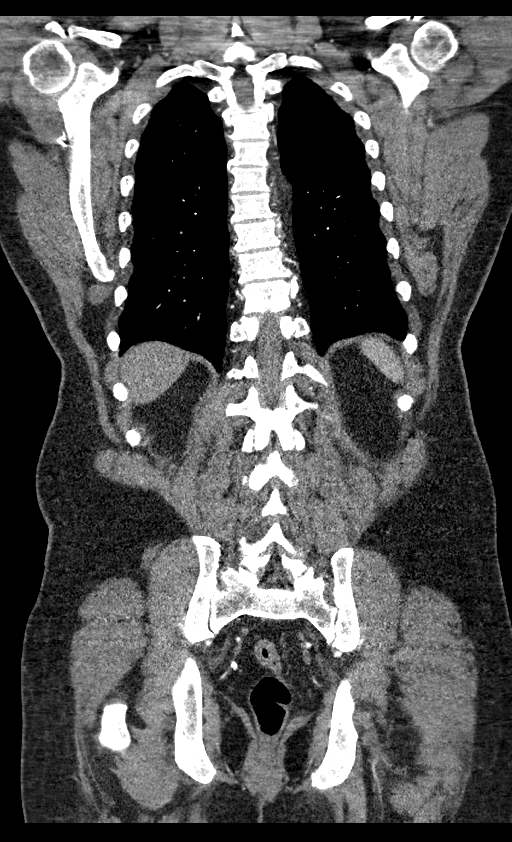

[15 of 46 positions shown; findings below may reference images not displayed]

FINDINGS: CTA CHEST FINDINGS

Cardiovascular: No thoracic aortic aneurysm or dissection. No
evidence of intramural hematoma.

Heart size is normal. No pericardial effusion. Scattered coronary
artery calcifications. No pulmonary embolism identified within the
main or central lobar pulmonary arteries.

Mediastinum/Nodes: Esophagus appears normal. No mass or enlarged
lymph nodes within the mediastinum or perihilar regions. 1.9 cm
hypodense lesion within the left thyroid lobe. Trachea and central
bronchi are unremarkable.

Lungs/Pleura: Lungs are clear.  No pleural effusion or pneumothorax.

Musculoskeletal: Mild degenerative change in the lower thoracic
spine. No acute or suspicious osseous finding.

Review of the MIP images confirms the above findings.

CTA ABDOMEN AND PELVIS FINDINGS

VASCULAR

Aorta: Normal caliber aorta without aneurysm, dissection, vasculitis
or significant stenosis.

Celiac: Patent without evidence of aneurysm, dissection, vasculitis
or significant stenosis.

SMA: Patent without evidence of aneurysm, dissection, vasculitis or
significant stenosis.

Renals: Both renal arteries are patent without evidence of aneurysm,
dissection, vasculitis, fibromuscular dysplasia or significant
stenosis.

IMA: Patent without evidence of aneurysm, dissection, vasculitis or
significant stenosis.

Inflow: Patent without evidence of aneurysm, dissection, vasculitis
or significant stenosis.

Veins: No obvious venous abnormality within the limitations of this
arterial phase study.

Review of the MIP images confirms the above findings.

NON-VASCULAR

Hepatobiliary: No focal liver abnormality is seen. No gallstones,
gallbladder wall thickening, or biliary dilatation.

Pancreas: Unremarkable. No pancreatic ductal dilatation or
surrounding inflammatory changes.

Spleen: Normal in size without focal abnormality.

Adrenals/Urinary Tract: Adrenal glands appear normal. Kidneys are
unremarkable without mass, stone or hydronephrosis. No perinephric
fluid. No ureteral or bladder calculi identified. Bladder is
unremarkable, partially decompressed.

Stomach/Bowel: Bowel is normal in caliber. No bowel wall thickening
or evidence of bowel wall inflammation seen. Appendix is normal.
Stomach is unremarkable.

Lymphatic: No enlarged lymph nodes seen.

Reproductive: Prostate gland is enlarged causing some mass effect on
the bladder base.

Other: No free fluid or abscess collection. No free intraperitoneal
air.

Musculoskeletal: Degenerative disc desiccations throughout the
lumbar spine, at least moderate in degree. No acute or suspicious
osseous finding.

Review of the MIP images confirms the above findings.
IMPRESSION: 1. Overall, no acute findings. No thoracic aortic aneurysm or
dissection. No abdominal aortic aneurysm or dissection. Lungs are
clear. No acute intra-abdominal or intrapelvic abnormality.
2. Coronary artery calcifications. Recommend correlation with any
possible associated cardiac symptoms. Heart size is normal. No
pericardial effusion.
3. 1.9 cm hypodense lesion within the left thyroid lobe. Per
consensus guidelines, recommend nonemergent thyroid ultrasound for
further characterization.
4. Prostate gland is at least moderately enlarged, causing mass
effect on the bladder base. Consider correlation with PSA lab
values.
5. Advanced degenerative disc desiccations throughout the lumbar
spine. Milder degenerative change within the thoracic spine. No
acute appearing osseous abnormality

## 2018-08-23 ENCOUNTER — Other Ambulatory Visit: Payer: Self-pay | Admitting: Cardiology

## 2018-08-25 NOTE — Telephone Encounter (Signed)
Rx refill sent to pharmacy. 

## 2018-11-17 ENCOUNTER — Ambulatory Visit (INDEPENDENT_AMBULATORY_CARE_PROVIDER_SITE_OTHER): Payer: Medicare Other | Admitting: Cardiology

## 2018-11-17 ENCOUNTER — Encounter: Payer: Self-pay | Admitting: Cardiology

## 2018-11-17 ENCOUNTER — Other Ambulatory Visit: Payer: Self-pay

## 2018-11-17 VITALS — BP 140/80 | HR 76 | Resp 14 | Ht 67.0 in | Wt 236.1 lb

## 2018-11-17 DIAGNOSIS — I2510 Atherosclerotic heart disease of native coronary artery without angina pectoris: Secondary | ICD-10-CM

## 2018-11-17 DIAGNOSIS — E785 Hyperlipidemia, unspecified: Secondary | ICD-10-CM | POA: Diagnosis not present

## 2018-11-17 DIAGNOSIS — I471 Supraventricular tachycardia: Secondary | ICD-10-CM | POA: Diagnosis not present

## 2018-11-17 MED ORDER — NITROGLYCERIN 0.4 MG SL SUBL: 0.4000 mg | SUBLINGUAL_TABLET | SUBLINGUAL | 2 refills | Status: AC | PRN

## 2018-11-17 NOTE — Patient Instructions (Signed)
Medication Instructions:  Your physician recommends that you continue on your current medications as directed. Please refer to the Current Medication list given to you today.  If you need a refill on your cardiac medications before your next appointment, please call your pharmacy.   Lab work: None.  If you have labs (blood work) drawn today and your tests are completely normal, you will receive your results only by: . MyChart Message (if you have MyChart) OR . A paper copy in the mail If you have any lab test that is abnormal or we need to change your treatment, we will call you to review the results.  Testing/Procedures: None.   Follow-Up: At [facility], you and your health needs are our priority.  As part of our continuing mission to provide you with exceptional heart care, we have created designated Provider Care Teams.  These Care Teams include your primary Cardiologist (physician) and Advanced Practice Providers (APPs -  Physician Assistants and Nurse Practitioners) who all work together to provide you with the care you need, when you need it. You will need a follow up appointment in 6 months.     

## 2018-11-17 NOTE — Progress Notes (Signed)
Cardiology Office Note:    Date:  11/17/2018   ID:  Tyler Ellis, DOB March 08, 1951, MRN [id number]  PCP:  Charleston Poot, MD  Cardiologist:  Jenne Campus, MD    Referring MD: Charleston Poot, MD   Chief Complaint  Patient presents with  . Follow-up  I am doing better  History of Present Illness:    Tyler Ellis is a 68 y.o. male with remote history of CAD, also supraventricular tachycardia successfully suppressed with beta-blocker.  Since have seen him last time he described 2 episodes of palpitations that happened a very stressful situation at home he felt his heart beating forcefully and fast.  At the same time he is very active he is able to cut grass and he got push mower he can do it for about 2 to 3 hours with no major difficulties obviously he would get tired he will get some knee aches but otherwise doing well.  Denies having any chest pain, tightness, pressure, burning in the chest  Past Medical History:  Diagnosis Date  . Coronary artery disease   . Diabetes mellitus without complication ([place])   . Hypertension     Past Surgical History:  Procedure Laterality Date  . KNEE SURGERY    . LEFT HEART CATH AND CORONARY ANGIOGRAPHY N/A 02/28/2017   Procedure: LEFT HEART CATH AND CORONARY ANGIOGRAPHY;  Surgeon: Burnell Blanks, MD;  Location: [facility];  Service: Cardiovascular;  Laterality: N/A;    Current Medications: Current Meds  Medication Sig  . acetaminophen-codeine (TYLENOL #3) 300-30 MG tablet Take 1 tablet every 8 (eight) hours as needed by mouth for moderate pain.   Marland Kitchen aspirin EC 81 MG EC tablet Take 1 tablet (81 mg total) daily by mouth.  Marland Kitchen atorvastatin (LIPITOR) 80 MG tablet Take 1 tablet (80 mg total) daily by mouth.  . diazepam (VALIUM) 5 MG tablet Take 2.5-5 mg 3 (three) times daily as needed by mouth for anxiety.   Marland Kitchen glipiZIDE (GLUCOTROL) 10 MG tablet Take 10 mg 2 (two) times daily before a meal by mouth.   . hydrochlorothiazide  (HYDRODIURIL) 25 MG tablet Take 25 mg daily by mouth.  Marland Kitchen lisinopril (PRINIVIL,ZESTRIL) 40 MG tablet Take 40 mg daily by mouth.  . metFORMIN (GLUCOPHAGE) 500 MG tablet Take 1 tablet (500 mg total) 2 (two) times daily with a meal by mouth. Resume on 03/03/17.  Marland Kitchen metoprolol succinate (TOPROL-XL) 50 MG 24 hr tablet TAKE 3 TABLETS BY MOUTH DAILY WITH OR IMMEDIATELY FOLLOWING A MEAL  . nitroGLYCERIN (NITROSTAT) 0.4 MG SL tablet Place 1 tablet (0.4 mg total) every 5 (five) minutes as needed under the tongue for chest pain.     Allergies:   Patient has no known allergies.   Social History   Socioeconomic History  . Marital status: Divorced    Spouse name: Not on file  . Number of children: Not on file  . Years of education: Not on file  . Highest education level: Not on file  Occupational History  . Not on file  Social Needs  . Financial resource strain: Not on file  . Food insecurity    Worry: Not on file    Inability: Not on file  . Transportation needs    Medical: Not on file    Non-medical: Not on file  Tobacco Use  . Smoking status: Never Smoker  . Smokeless tobacco: Never Used  Substance and Sexual Activity  . Alcohol use: Yes    Comment: weekly  .  Drug use: No  . Sexual activity: Not on file  Lifestyle  . Physical activity    Days per week: Not on file    Minutes per session: Not on file  . Stress: Not on file  Relationships  . Social Musicianconnections    Talks on phone: Not on file    Gets together: Not on file    Attends religious service: Not on file    Active member of club or organization: Not on file    Attends meetings of clubs or organizations: Not on file    Relationship status: Not on file  Other Topics Concern  . Not on file  Social History Narrative  . Not on file     Family History: The patient's family history includes Hypertension in his father and mother. ROS:   Please see the history of present illness.    All 14 point review of systems negative  except as described per history of present illness  EKGs/Labs/Other Studies Reviewed:      Recent Labs: 02/12/2018: ALT 35; BUN 16; Creatinine, Ser 1.03; Hemoglobin 15.8; Platelets 189; Potassium 3.6; Sodium 134  Recent Lipid Panel    Component Value Date/Time   CHOL 191 02/28/2017 0017   TRIG 288 (H) 02/28/2017 0017   HDL 52 02/28/2017 0017   CHOLHDL 3.7 02/28/2017 0017   VLDL 58 (H) 02/28/2017 0017   LDLCALC 81 02/28/2017 0017    Physical Exam:    VS:  BP 140/80   Pulse 76   Resp 14   Ht 5\' 7"  (1.702 m)   Wt 236 lb 1.9 oz (107.1 kg)   BMI 36.98 kg/m     Wt Readings from Last 3 Encounters:  11/17/18 236 lb 1.9 oz (107.1 kg)  06/10/18 233 lb 6.4 oz (105.9 kg)  03/18/18 227 lb 12.8 oz (103.3 kg)     GEN:  Well nourished, well developed in no acute distress HEENT: Normal NECK: No JVD; No carotid bruits LYMPHATICS: No lymphadenopathy CARDIAC: RRR, no murmurs, no rubs, no gallops RESPIRATORY:  Clear to auscultation without rales, wheezing or rhonchi  ABDOMEN: Soft, non-tender, non-distended MUSCULOSKELETAL:  No edema; No deformity  SKIN: Warm and dry LOWER EXTREMITIES: no swelling NEUROLOGIC:  Alert and oriented x 3 PSYCHIATRIC:  Normal affect   ASSESSMENT:    1. Supraventricular tachycardia (HCC)   2. Coronary artery disease involving native coronary artery of native heart without angina pectoris   3. Dyslipidemia    PLAN:    In order of problems listed above:  1. Supraventricular tachycardia will continue present management which include beta-blocker. 2. 2.  Coronary artery disease stable denies have any recent issues. 3. 3.  Dyslipidemia.  He is on Lipitor 80 I will call primary care physician to get fasting profile.   Medication Adjustments/Labs and Tests Ordered: Current medicines are reviewed at length with the patient today.  Concerns regarding medicines are outlined above.  No orders of the defined types were placed in this encounter.  Medication  changes: No orders of the defined types were placed in this encounter.   Signed, Georgeanna Leaobert J. Tamyah Cutbirth, MD, [facility] 11/17/2018 11:14 AM    [facility]

## 2018-12-01 ENCOUNTER — Other Ambulatory Visit: Payer: Self-pay | Admitting: Cardiology

## 2018-12-13 ENCOUNTER — Emergency Department (HOSPITAL_BASED_OUTPATIENT_CLINIC_OR_DEPARTMENT_OTHER)
Admission: EM | Admit: 2018-12-13 | Discharge: 2018-12-13 | Disposition: A | Discharge status: Home / Self Care | Payer: Medicare Other | Attending: Emergency Medicine | Admitting: Emergency Medicine

## 2018-12-13 ENCOUNTER — Encounter (HOSPITAL_BASED_OUTPATIENT_CLINIC_OR_DEPARTMENT_OTHER): Payer: Self-pay | Admitting: Emergency Medicine

## 2018-12-13 ENCOUNTER — Other Ambulatory Visit: Payer: Self-pay

## 2018-12-13 DIAGNOSIS — I2510 Atherosclerotic heart disease of native coronary artery without angina pectoris: Secondary | ICD-10-CM | POA: Diagnosis not present

## 2018-12-13 DIAGNOSIS — Z9861 Coronary angioplasty status: Secondary | ICD-10-CM | POA: Diagnosis not present

## 2018-12-13 DIAGNOSIS — Z7984 Long term (current) use of oral hypoglycemic drugs: Secondary | ICD-10-CM | POA: Insufficient documentation

## 2018-12-13 DIAGNOSIS — R0789 Other chest pain: Secondary | ICD-10-CM | POA: Diagnosis present

## 2018-12-13 DIAGNOSIS — Z79899 Other long term (current) drug therapy: Secondary | ICD-10-CM | POA: Insufficient documentation

## 2018-12-13 DIAGNOSIS — Z7982 Long term (current) use of aspirin: Secondary | ICD-10-CM | POA: Insufficient documentation

## 2018-12-13 DIAGNOSIS — I471 Supraventricular tachycardia: Secondary | ICD-10-CM | POA: Diagnosis not present

## 2018-12-13 DIAGNOSIS — I10 Essential (primary) hypertension: Secondary | ICD-10-CM | POA: Insufficient documentation

## 2018-12-13 DIAGNOSIS — E119 Type 2 diabetes mellitus without complications: Secondary | ICD-10-CM | POA: Diagnosis not present

## 2018-12-13 LAB — CBC WITH DIFFERENTIAL/PLATELET
Abs Immature Granulocytes: 0.01 10*3/uL (ref 0.00–0.07)
Basophils Absolute: 0 10*3/uL (ref 0.0–0.1)
Basophils Relative: 1 %
Eosinophils Absolute: 0 10*3/uL (ref 0.0–0.5)
Eosinophils Relative: 0 %
HCT: 47.6 % (ref 39.0–52.0)
Hemoglobin: 15.7 g/dL (ref 13.0–17.0)
Immature Granulocytes: 0 %
Lymphocytes Relative: 26 %
Lymphs Abs: 1.6 10*3/uL (ref 0.7–4.0)
MCH: 31.9 pg (ref 26.0–34.0)
MCHC: 33 g/dL (ref 30.0–36.0)
MCV: 96.7 fL (ref 80.0–100.0)
Monocytes Absolute: 0.5 10*3/uL (ref 0.1–1.0)
Monocytes Relative: 8 %
Neutro Abs: 4 10*3/uL (ref 1.7–7.7)
Neutrophils Relative %: 65 %
Platelets: 186 10*3/uL (ref 150–400)
RBC: 4.92 MIL/uL (ref 4.22–5.81)
RDW: 13.1 % (ref 11.5–15.5)
WBC: 6.1 10*3/uL (ref 4.0–10.5)
nRBC: 0 % (ref 0.0–0.2)

## 2018-12-13 LAB — MAGNESIUM: Magnesium: 1.6 mg/dL — ABNORMAL LOW (ref 1.7–2.4)

## 2018-12-13 LAB — BASIC METABOLIC PANEL
Anion gap: 16 — ABNORMAL HIGH (ref 5–15)
BUN: 10 mg/dL (ref 8–23)
CO2: 23 mmol/L (ref 22–32)
Calcium: 9.3 mg/dL (ref 8.9–10.3)
Chloride: 97 mmol/L — ABNORMAL LOW (ref 98–111)
Creatinine, Ser: 1.08 mg/dL (ref 0.61–1.24)
GFR calc Af Amer: >60 mL/min (ref >60)
GFR calc non Af Amer: >60 mL/min (ref >60)
Glucose, Bld: 392 mg/dL — ABNORMAL HIGH (ref 70–99)
Potassium: 4.1 mmol/L (ref 3.5–5.1)
Sodium: 136 mmol/L (ref 135–145)

## 2018-12-13 LAB — TROPONIN I (HIGH SENSITIVITY)
Troponin I (High Sensitivity): 5 ng/L (ref <18)
Troponin I (High Sensitivity): 19 ng/L — ABNORMAL HIGH (ref <18)

## 2018-12-13 MED ORDER — ADENOSINE 6 MG/2ML IV SOLN
INTRAVENOUS | Status: AC
  Filled 2018-12-13: qty 2

## 2018-12-13 MED ORDER — MAGNESIUM SULFATE 2 GM/50ML IV SOLN
2.0000 g | Freq: Once | INTRAVENOUS | Status: AC
  Administered 2018-12-13: 2 g via INTRAVENOUS
  Filled 2018-12-13: qty 50

## 2018-12-13 MED ORDER — ADENOSINE 6 MG/2ML IV SOLN
6.0000 mg | Freq: Once | INTRAVENOUS | Status: AC
  Administered 2018-12-13: 6 mg via INTRAVENOUS

## 2018-12-13 MED ORDER — SODIUM CHLORIDE 0.9 % IV SOLN
INTRAVENOUS | Status: DC | PRN
  Administered 2018-12-13: 500 mL via INTRAVENOUS

## 2018-12-13 NOTE — Discharge Instructions (Addendum)
You were seen in the emergency department for a rapid heart rate along with chest pain and shortness of breath.  You were found to be in SVT and we gave you a medication which put you back into sinus rhythm.  Your blood test showed your magnesium was low and we gave you some magnesium here.  Please contact your cardiologist for close follow-up.  Return if any worsening symptoms.

## 2018-12-13 NOTE — ED Triage Notes (Addendum)
Chest pain and tachycardia x 1 hour. He took 2 NTG PTA

## 2018-12-13 NOTE — ED Provider Notes (Signed)
[facility] EMERGENCY DEPARTMENT Provider Note   CSN: [id number] Arrival date & time: 12/13/18  1100     History   Chief Complaint Chief Complaint  Patient presents with  . Chest Pain    HPI Tyler Ellis is a 68 y.o. male.  He has a history of hypertension and diabetes.  He is complaining of acute onset of chest tightness and diaphoresis that started about an hour ago.  He rates it as 8 out of 10.  He has had similar episodes in the past.  He took some nitro and without any change.  No significant shortness of breath no recent illness.     The history is provided by the patient.  Chest Pain Pain location:  Substernal area Pain quality: pressure   Pain radiates to:  Does not radiate Pain severity:  Severe Onset quality:  Sudden Duration:  1 hour Timing:  Constant Progression:  Unchanged Chronicity:  Recurrent Relieved by:  Nothing Worsened by:  Nothing Ineffective treatments:  None tried Associated symptoms: diaphoresis, fatigue and palpitations   Associated symptoms: no abdominal pain, no altered mental status, no back pain, no cough, no fever, no headache, no lower extremity edema, no shortness of breath and no syncope   Risk factors: diabetes mellitus and hypertension   Risk factors: no smoking     Past Medical History:  Diagnosis Date  . Coronary artery disease   . Diabetes mellitus without complication (HCC)   . Hypertension     Patient Active Problem List   Diagnosis Date Noted  . Supraventricular tachycardia (HCC) 02/14/2018  . Coronary artery disease nonobstructive disease by cardiac catheterization from November 2019 03/11/2017  . Type 2 diabetes mellitus with complication, without long-term current use of insulin (HCC) 03/11/2017  . Dyslipidemia 03/11/2017  . Essential hypertension 03/11/2017  . NSTEMI (non-ST elevated myocardial infarction) (HCC) 02/27/2017    Past Surgical History:  Procedure Laterality Date  . KNEE SURGERY    . LEFT  HEART CATH AND CORONARY ANGIOGRAPHY N/A 02/28/2017   Procedure: LEFT HEART CATH AND CORONARY ANGIOGRAPHY;  Surgeon: Kathleene HazelMcAlhany, Christopher D, MD;  Location: [facility];  Service: Cardiovascular;  Laterality: N/A;        Home Medications    Prior to Admission medications   Medication Sig Start Date End Date Taking? Authorizing Provider  acetaminophen-codeine (TYLENOL #3) 300-30 MG tablet Take 1 tablet every 8 (eight) hours as needed by mouth for moderate pain.     [provider]  aspirin EC 81 MG EC tablet Take 1 tablet (81 mg total) daily by mouth. 03/01/17   [facility]uke, Roe RutherfordAngela Nicole, PA  atorvastatin (LIPITOR) 80 MG tablet Take 1 tablet (80 mg total) daily by mouth. 02/28/17   [facility], Roe RutherfordAngela Nicole, PA  diazepam (VALIUM) 5 MG tablet Take 2.5-5 mg 3 (three) times daily as needed by mouth for anxiety.     [provider]  glipiZIDE (GLUCOTROL) 10 MG tablet Take 10 mg 2 (two) times daily before a meal by mouth.     [provider]  hydrochlorothiazide (HYDRODIURIL) 25 MG tablet Take 25 mg daily by mouth.    [provider]  lisinopril (PRINIVIL,ZESTRIL) 40 MG tablet Take 40 mg daily by mouth.    [provider]  metFORMIN (GLUCOPHAGE) 500 MG tablet Take 1 tablet (500 mg total) 2 (two) times daily with a meal by mouth. Resume on 03/03/17. 02/28/17   Marcelino Dusteruke, Angela Nicole, PA  metoprolol succinate (TOPROL-XL) 50 MG 24  hr tablet TAKE 3 TABLETS BY MOUTH DAILY WITH OR IMMEDIATELY FOLLOWING A MEAL 12/02/18   Georgeanna LeaKrasowski, Robert J, MD  nitroGLYCERIN (NITROSTAT) 0.4 MG SL tablet Place 1 tablet (0.4 mg total) under the tongue every 5 (five) minutes as needed for chest pain. 11/17/18 02/15/19  Georgeanna LeaKrasowski, Robert J, MD    Family History Family History  Problem Relation Age of Onset  . Hypertension Father   . Hypertension Mother     Social History Social History   Tobacco Use  . Smoking status: Never Smoker  . Smokeless tobacco: Never Used  Substance Use  Topics  . Alcohol use: Yes    Comment: weekly  . Drug use: No     Allergies   Patient has no known allergies.   Review of Systems Review of Systems  Constitutional: Positive for diaphoresis and fatigue. Negative for fever.  HENT: Negative for sore throat.   Eyes: Negative for visual disturbance.  Respiratory: Negative for cough and shortness of breath.   Cardiovascular: Positive for chest pain and palpitations. Negative for syncope.  Gastrointestinal: Negative for abdominal pain.  Genitourinary: Negative for dysuria.  Musculoskeletal: Negative for back pain.  Skin: Negative for rash.  Neurological: Negative for headaches.     Physical Exam Updated Vital Signs BP 120/82 (BP Location: Right Arm)   Pulse (!) 172   Resp 20   SpO2 97%   Physical Exam Vitals signs and nursing note reviewed.  Constitutional:      Appearance: He is well-developed.  HENT:     Head: Normocephalic and atraumatic.  Eyes:     Conjunctiva/sclera: Conjunctivae normal.  Neck:     Musculoskeletal: Neck supple.  Cardiovascular:     Rate and Rhythm: Regular rhythm. Tachycardia present.     Heart sounds: Normal heart sounds. No murmur.  Pulmonary:     Effort: Pulmonary effort is normal. No respiratory distress.     Breath sounds: Normal breath sounds.  Abdominal:     Palpations: Abdomen is soft.     Tenderness: There is no abdominal tenderness.  Skin:    General: Skin is warm and dry.     Capillary Refill: Capillary refill takes less than 2 seconds.  Neurological:     General: No focal deficit present.     Mental Status: He is alert.      ED Treatments / Results  Labs (all labs ordered are listed, but only abnormal results are displayed) Labs Reviewed  BASIC METABOLIC PANEL - Abnormal; Notable for the following components:      Result Value   Chloride 97 (*)    Glucose, Bld 392 (*)    Anion gap 16 (*)    All other components within normal limits  MAGNESIUM - Abnormal; Notable for  the following components:   Magnesium 1.6 (*)    All other components within normal limits  TROPONIN I (HIGH SENSITIVITY) - Abnormal; Notable for the following components:   Troponin I (High Sensitivity) 19 (*)    All other components within normal limits  CBC WITH DIFFERENTIAL/PLATELET  TROPONIN I (HIGH SENSITIVITY)    EKG EKG Interpretation  Date/Time:  Saturday December 13 2018 11:09:15 EDT Ventricular Rate:  170 PR Interval:    QRS Duration: 103 QT Interval:  284 QTC Calculation: 478 R Axis:   -23 Text Interpretation:  Supraventricular tachycardia Borderline left axis deviation RSR' in V1 or V2, probably normal variant ST depression, probably rate related increased rate and ischemic changes compared with prior 10/19  Confirmed by Aletta Edouard [phone]) on 12/13/2018 11:17:47 AM   Radiology No results found.  Procedures .Critical Care Performed by: Hayden Rasmussen, MD Authorized by: Hayden Rasmussen, MD   Critical care provider statement:    Critical care time (minutes):  30   Critical care time was exclusive of:  Separately billable procedures and treating other patients   Critical care was necessary to treat or prevent imminent or life-threatening deterioration of the following conditions:  Circulatory failure and metabolic crisis   Critical care was time spent personally by me on the following activities:  Evaluation of patient's response to treatment, examination of patient, ordering and performing treatments and interventions, ordering and review of laboratory studies, ordering and review of radiographic studies, pulse oximetry, re-evaluation of patient's condition, obtaining history from patient or surrogate, review of old charts and development of treatment plan with patient or surrogate   I assumed direction of critical care for this patient from another provider in my specialty: no     (including critical care time)  Medications Ordered in ED Medications  0.9 %   sodium chloride infusion ( Intravenous Stopped 12/13/18 1508)  adenosine (ADENOCARD) 6 MG/2ML injection 6 mg (6 mg Intravenous Given 12/13/18 1123)  magnesium sulfate IVPB 2 g 50 mL (0 g Intravenous Stopped 12/13/18 1402)     Initial Impression / Assessment and Plan / ED Course  I have reviewed the triage vital signs and the nursing notes.  Pertinent labs & imaging results that were available during my care of the patient were reviewed by me and considered in my medical decision making (see chart for details).  Clinical Course as of Dec 12 1528  Sat Dec 13, 2018  1127 Patient in narrow complex tachycardia likely SVT.  He said he has been in this before and I reviewed prior medical records showing that is received adenosine.  He was placed on a cardiac monitor and crash cart at bedside pacer pads put on.  He received 6 mg of adenosine with a brief pause and some extra beats and then converted into sinus.  Patient tolerated the procedure well.   [MB]  0865 Patient remains asymptomatic after the adenosine.  Heart rates around 100.  Magnesium was a little low so repleted IV.  He will likely be able to be discharged soon.   [MB]  7846 Patient's troponin rose from 5-19 although he has no chest pain and feels clinically better now that he is back in normal sinus rhythm.  Per his last cath he did not show any evidence of any ischemic disease and so I think it would be appropriate to discharge him and have him follow-up outpatient with his cardiologist.  We repleted his magnesium that was slightly low.   [MB]    Clinical Course User Index [MB] Hayden Rasmussen, MD        Final Clinical Impressions(s) / ED Diagnoses   Final diagnoses:  SVT (supraventricular tachycardia) [facility])    ED Discharge Orders    None       Hayden Rasmussen, MD 12/13/18 1531

## 2018-12-13 NOTE — ED Notes (Signed)
Patient placed on ETCO2 nasal canula. ETCO2 34

## 2018-12-22 ENCOUNTER — Ambulatory Visit (INDEPENDENT_AMBULATORY_CARE_PROVIDER_SITE_OTHER): Payer: Medicare Other | Admitting: Family

## 2018-12-22 ENCOUNTER — Other Ambulatory Visit: Payer: Self-pay

## 2018-12-22 ENCOUNTER — Encounter: Payer: Self-pay | Admitting: Family

## 2018-12-22 VITALS — BP 152/86 | HR 91 | Ht 66.0 in | Wt 236.0 lb

## 2018-12-22 DIAGNOSIS — I2510 Atherosclerotic heart disease of native coronary artery without angina pectoris: Secondary | ICD-10-CM | POA: Diagnosis not present

## 2018-12-22 DIAGNOSIS — I471 Supraventricular tachycardia: Secondary | ICD-10-CM | POA: Diagnosis not present

## 2018-12-22 DIAGNOSIS — I10 Essential (primary) hypertension: Secondary | ICD-10-CM | POA: Diagnosis not present

## 2018-12-22 DIAGNOSIS — E785 Hyperlipidemia, unspecified: Secondary | ICD-10-CM

## 2018-12-22 MED ORDER — VERAPAMIL HCL ER 120 MG PO TBCR: 120.0000 mg | EXTENDED_RELEASE_TABLET | Freq: Every day | ORAL | 1 refills | Status: DC

## 2018-12-22 NOTE — Progress Notes (Signed)
Office Visit    Patient Name: Tyler Ellis Date of Encounter: 12/22/2018  Primary Care Provider:  Forrest Moron, MD Primary Cardiologist:  Gypsy Balsam, MD  Chief Complaint    68 year old male with hx of SVT, CAD, DLD, DM2 presents for follow up after ED visit for SVT.  Past Medical History    Past Medical History:  Diagnosis Date  . Coronary artery disease   . Diabetes mellitus without complication (HCC)   . Hypertension    Past Surgical History:  Procedure Laterality Date  . KNEE SURGERY    . LEFT HEART CATH AND CORONARY ANGIOGRAPHY N/A 02/28/2017   Procedure: LEFT HEART CATH AND CORONARY ANGIOGRAPHY;  Surgeon: Kathleene Hazel, MD;  Location: [facility];  Service: Cardiovascular;  Laterality: N/A;    Allergies  No Known Allergies  History of Present Illness    Tyler Ellis is a 68 y.o. male with a hx of CAD, PSVT, DLD, HTN, DM2 last seen by Dr. Bing Matter 11/17/18. He was seen in the ED 12/13/18 with SVT and converted with 6mg  adenosine, repleted with IV magnesium.   Reports episode of SVT started while he was driving.  States he headed towards home and when he got home asked his wife to take him to the emergency department.  Stated it felt like his heart was "beating really fast "and she was short of breath.  Reports SVT onset 1.5 years ago reports he has been to the ED about every 8 or 9 months to be converted out.  Denies proarrhythmic medications, denies smoking, and denies illicit drug use.  We discussed various causes of SVT as he had questions.  Reports he is compliant with his metoprolol.  We discussed referral to EP for possible SVT ablation and/or management of medications.  He requests to wait for this referral until the new year.  We discussed the addition of a calcium channel blocker to his beta-blocker regimen and he is agreeable.  He checks his blood pressure at home daily and reports his systolic blood pressures are typically 130-140.   EKGs/Labs/Other Studies Reviewed:   The following studies were reviewed today:  Cath 02/2017  Prox RCA lesion is 10% stenosed.  Mid RCA lesion is 10% stenosed.  The left ventricular systolic function is normal.  LV end diastolic pressure is normal.  The left ventricular ejection fraction is greater than 65% by visual estimate.  There is mild (2+) mitral regurgitation.   1. Mild non-obstructive CAD 2. Normal LV systolic function with normal filling pressures   Recommendations: No further ischemic evaluation.   Lexiscan 02/2018  Nuclear stress EF: 69%.  There was no ST segment deviation noted during stress.  No T wave inversion was noted during stress.  This is a low risk study.   Normal perfusion. LVEF 69% with normal wall motion. This is a low risk study  Echo 05/2018  1. The left ventricle has normal systolic function, with an ejection fraction of 55-60%. The cavity size was normal. There is moderate concentric left ventricular hypertrophy. Left ventricular diastolic Doppler parameters are consistent with impaired  relaxation Elevated left ventricular end-diastolic pressure No evidence of left ventricular regional wall motion abnormalities.  2. The right ventricle has normal systolic function. The cavity was normal. There is no increase in right ventricular wall thickness.  3. The mitral valve is normal in structure. No evidence of mitral valve stenosis.  4. The tricuspid valve is normal in structure.  5.  The aortic valve is normal in structure. no stenosis of the aortic valve.  6. The pulmonic valve was normal in structure.  7. The aortic root, ascending aorta and aortic arch are normal in size and structure.  8. No evidence of left ventricular regional wall motion abnormalities.  EKG:  EKG is ordered today.  The ekg ordered today demonstrates SR with rate 91 bpm.   Recent Labs: 02/12/2018: ALT 35 12/13/2018: BUN 10; Creatinine, Ser 1.08; Hemoglobin 15.7; Magnesium  1.6; Platelets 186; Potassium 4.1; Sodium 136   Recent Lipid Panel    Component Value Date/Time   CHOL 191 02/28/2017 0017   TRIG 288 (H) 02/28/2017 0017   HDL 52 02/28/2017 0017   CHOLHDL 3.7 02/28/2017 0017   VLDL 58 (H) 02/28/2017 0017   LDLCALC 81 02/28/2017 0017    Home Medications   Current Meds  Medication Sig  . acetaminophen-codeine (TYLENOL #3) 300-30 MG tablet Take 1 tablet every 8 (eight) hours as needed by mouth for moderate pain.   Marland Kitchen. aspirin EC 81 MG EC tablet Take 1 tablet (81 mg total) daily by mouth.  Marland Kitchen. atorvastatin (LIPITOR) 80 MG tablet Take 1 tablet (80 mg total) daily by mouth.  . diazepam (VALIUM) 5 MG tablet Take 2.5-5 mg 3 (three) times daily as needed by mouth for anxiety.   Marland Kitchen. glipiZIDE (GLUCOTROL) 10 MG tablet Take 10 mg 2 (two) times daily before a meal by mouth.   . hydrochlorothiazide (HYDRODIURIL) 25 MG tablet Take 25 mg daily by mouth.  Marland Kitchen. lisinopril (PRINIVIL,ZESTRIL) 40 MG tablet Take 40 mg daily by mouth.  . metFORMIN (GLUCOPHAGE) 500 MG tablet Take 1 tablet (500 mg total) 2 (two) times daily with a meal by mouth. Resume on 03/03/17.  Marland Kitchen. metoprolol succinate (TOPROL-XL) 50 MG 24 hr tablet TAKE 3 TABLETS BY MOUTH DAILY WITH OR IMMEDIATELY FOLLOWING A MEAL  . nitroGLYCERIN (NITROSTAT) 0.4 MG SL tablet Place 1 tablet (0.4 mg total) under the tongue every 5 (five) minutes as needed for chest pain.      Review of Systems      Review of Systems  Constitution: Negative for chills, fever and malaise/fatigue.  Cardiovascular: Positive for palpitations. Negative for chest pain, dyspnea on exertion, leg swelling, near-syncope and orthopnea.  Respiratory: Negative for cough, shortness of breath and wheezing.   Gastrointestinal: Negative for nausea and vomiting.   All other systems reviewed and are otherwise negative except as noted above.  Physical Exam    VS:  BP (!) 152/86   Pulse 91   Ht 5\' 6"  (1.676 m)   Wt 236 lb (107 kg)   SpO2 96%   BMI 38.09  kg/m  , BMI Body mass index is 38.09 kg/m. GEN: Well nourished, well developed, in no acute distress. HEENT: normal. Neck: Supple, no JVD, carotid bruits, or masses. Cardiac: RRR, no murmurs, rubs, or gallops. No clubbing, cyanosis, edema.  Radials/DP/PT 2+ and equal bilaterally.  Respiratory:  Respirations regular and unlabored, clear to auscultation bilaterally. GI: Soft, nontender, nondistended, BS + x 4. MS: No deformity or atrophy. Skin: Warm and dry, no rash. Neuro:  Strength and sensation are intact. Psych: Normal affect.  Accessory Clinical Findings    ECG personally reviewed by me today - SR rate 91 with no acute ST/T wave changes - no acute changes.  Assessment & Plan    1. SVT - ED visit 12/13/18 with SVT treated with adenosine. Reports initial SVT episode about 1.5 years about with recurrence  every 8-9 months. He is on Toprol XL 150 mg daily. Start Verapamil CD 120mg  daily. He is offered referral to EP, but states he would rather do this at the start of the year. Began discussion regarding potential SVT ablation.   Education provided on pro-arrthymic medications.   Check BMP and Magnesium today. As Magnesium was replaced in the ED.  2. CAD - Stable, no anginal symptoms. Continue GDMT aspirin, beta blocker, statin.   3. DLD - Continue statin. Continue to follow with PCP.   4. HTN - BP elevated today. Reports home blood pressures are SBP 130s-140s. Start Verapamil, as above. He will keep log for 2 weeks and call us to follow up.   Disposition: Report of BP in 2 weeks. If additional events of SVT will require EP consultation. Follow up in 6 months with Dr. Agustin Cree as previously scheduled.   Loel Dubonnet, NP 12/22/2018, 3:47 PM

## 2018-12-22 NOTE — Patient Instructions (Addendum)
Medication Instructions:  Your physician has recommended you make the following change in your medication: START Verapamil 120mg  daily  If you need a refill on your cardiac medications before your next appointment, please call your pharmacy.   Lab work: Your physician recommends that you return for lab work today: BMET, Magnesium  If you have labs (blood work) drawn today and your tests are completely normal, you will receive your results only by: Marland Kitchen MyChart Message (if you have MyChart) OR . A paper copy in the mail If you have any lab test that is abnormal or we need to change your treatment, we will call you to review the results.  Testing/Procedures: You had an EKG today.   Follow-Up: At [facility], you and your health needs are our priority.  As part of our continuing mission to provide you with exceptional heart care, we have created designated Provider Care Teams.  These Care Teams include your primary Cardiologist (physician) and Advanced Practice Providers (APPs -  Physician Assistants and Nurse Practitioners) who all work together to provide you with the care you need, when you need it. You will need a follow up appointment in 6 months.  Please call our office 2 months in advance to schedule this appointment.  You may see Jenne Campus, MD or another member of our [place] Provider Team in [place]: Shirlee More, MD . Jyl Heinz, MD  Any Other Special Instructions Will Be Listed Below (If Applicable).  Call in 2 weeks with a report of your blood pressures.   Medications and arrthymia 1. Avoid all over-the-counter antihistamines except Claritin/Loratadine and Zyrtec/Cetrizine. 2. Avoid all combination including cold sinus allergies flu decongestant and sleep medications 3. You can use Robitussin DM Mucinex and Mucinex DM for cough. 4. can use Tylenol aspirin ibuprofen and naproxen but no combinations such as sleep or sinus.  Verapamil sustained-release  capsules What is this medicine? VERAPAMIL (ver AP a mil) is a calcium-channel blocker. It affects the amount of calcium found in your heart and muscle cells. This relaxes your blood vessels, which can reduce the amount of work the heart has to do. This medicine is used to lower high blood pressure. This medicine may be used for other purposes; ask your health care provider or pharmacist if you have questions. COMMON BRAND NAME(S): Verelan, Verelan PM What should I tell my health care provider before I take this medicine? They need to know if you have any of these conditions:  heart or blood vessel disease  heart rhythm disturbances such as sick sinus syndrome, ventricular arrhythmias, Wolff-Parkinson-White syndrome, or Lown-Ganong-Levine syndrome  liver or kidney disease  low blood pressure  an unusual or allergic reaction to verapamil, other medicines, foods, dyes, or preservatives  pregnant or trying to get pregnant  breast-feeding How should I use this medicine? Take this medicine by mouth with a glass of water. Follow the directions on the prescription label. The capsules may be opened and the medicine poured into a small amount of applesauce. Stir well and swallow without chewing. Take this medicine with food to reduce stomach upset. Take your doses at regular intervals. Do not take your medicine more often then directed. Do not stop taking except on the advice of your doctor or health care professional. Talk to your pediatrician regarding the use of this medicine in children. Special care may be needed. Overdosage: If you think you have taken too much of this medicine contact a poison control center or emergency room  at once. NOTE: This medicine is only for you. Do not share this medicine with others. What if I miss a dose? If you miss a dose, take it as soon as you can. If it is almost time for your next dose, take only that dose. Do not take double or extra doses. What may  interact with this medicine? Do not take this medicine with any of the following:  cisapride  disopyramide  dofetilide  grapefruit juice  hawthorn  pimozide  red yeast rice This medicine may also interact with the following medications:  barbiturates such as phenobarbital  cimetidine  cyclosporine  lithium  local anesthetics or general anesthetics  medicines for heart rhythm problems like amiodarone, digoxin, flecainide, procainamide, quinidine  medicines for high blood pressure or heart problems  medicines for seizures like carbamazepine and phenytoin  rifampin, rifabutin or rifapentine  theophylline or aminophylline This list may not describe all possible interactions. Give your health care provider a list of all the medicines, herbs, non-prescription drugs, or dietary supplements you use. Also tell them if you smoke, drink alcohol, or use illegal drugs. Some items may interact with your medicine. What should I watch for while using this medicine? Check your blood pressure and pulse rate regularly. Ask your doctor or health care professional what your blood pressure and pulse rate should be and when you should contact him or her. Do not suddenly stop taking this medicine. Ask your doctor or health care professional how to gradually reduce the dose. You may get drowsy or dizzy. Do not drive, use machinery, or do anything that needs mental alertness until you know how this medicine affects you. Do not stand or sit up quickly, especially if you are an older patient. This reduces the risk of dizzy or fainting spells. Alcohol may interfere with the effect of this medicine. Avoid alcoholic drinks. What side effects may I notice from receiving this medicine? Side effects that you should report to your doctor or health care professional as soon as possible:  difficulty breathing  dizziness or light headedness  fainting  fast heartbeat, palpitations, irregular heartbeat, or  chest pain  skin rash  slow heartbeat  swelling of the legs or ankles Side effects that usually do not require medical attention (report to your doctor or health care professional if they continue or are bothersome):  constipation  facial flushing  headache  nausea, vomiting  sexual dysfunction  weakness or tiredness This list may not describe all possible side effects. Call your doctor for medical advice about side effects. You may report side effects to FDA at 1-800-FDA-1088. Where should I keep my medicine? Keep out of the reach of children. Store at room temperature between 15 and 25 degrees C (59 and 77 degrees F). Protect from light and moisture. Keep container tightly closed. NOTE: This sheet is a summary. It may not cover all possible information. If you have questions about this medicine, talk to your doctor, pharmacist, or health care provider.  2020 Elsevier/Gold Standard (2008-01-05 17:47:58)  Supraventricular Tachycardia, Adult Supraventricular tachycardia (SVT) is a kind of abnormal heartbeat. It makes your heart beat very fast and then beat at a normal speed. A normal resting heartbeat is 60-100 times a minute. This condition can make your heart beat more than 150 times a minute. Times of having a fast heartbeat (episodes) can be scary, but they are usually not dangerous. In some cases, they may lead to heart failure if:  They happen many times per  day.  Last longer than a few seconds. What are the causes?  A normal heartbeat starts when an area called the sinoatrial node sends out an electrical signal. In SVT, other areas of the heart send out signals that get in the way of the signal from the sinoatrial node. What increases the risk? You are more likely to develop this condition if you are:  4412-442 years old.  A woman. The following factors may make you more likely to develop this condition:  Stress.  Tiredness.  Smoking.  Stimulant drugs, such as  cocaine and methamphetamine.  Alcohol.  Caffeine.  Pregnancy.  Feeling worried or nervous (anxiety). What are the signs or symptoms?  A pounding heart.  A feeling that your heart is skipping beats (palpitations).  Weakness.  Trouble getting enough air.  Pain or tightness in your chest.  Feeling like you are going to pass out (faint).  Feeling worried or nervous.  Dizziness.  Sweating.  Feeling sick to your stomach (nausea).  Passing out.  Tiredness. Sometimes, there are no symptoms. How is this treated?  Vagal nerve stimulation. Ways to do this include: ? Holding your breath and pushing, as though you are pooping (having a bowel movement). ? Massaging an area on one side of your neck. Do not try this yourself. Only a doctor should do this. If done the wrong way, it can lead to a stroke. ? Bending forward with your head between your legs. ? Coughing while bending forward with your head between your legs. ? Closing your eyes and massaging your eyeballs. Ask a doctor how to do this.  Medicines that prevent attacks.  Medicine to stop an attack given through an IV tube at the hospital.  A small electric shock (cardioversion) that stops an attack.  Radiofrequency ablation. In this procedure, a small, thin tube (catheter) is used to send energy to the area that is causing the rapid heartbeats. If you do not have symptoms, you may not need treatment. Follow these instructions at home: Stress  Avoid things that make you feel stressed.  To deal with stress, try: ? Doing yoga or meditation, or being out in nature. ? Listening to relaxing music. ? Doing deep breathing. ? Taking steps to be healthy, such as getting lots of sleep, exercising, and eating a balanced diet. ? Talking with a mental health doctor. Lifestyle   Try to get at least 7 hours of sleep each night.  Do not use any products that contain nicotine or tobacco, such as cigarettes, e-cigarettes, and  chewing tobacco. If you need help quitting, ask your doctor.  Be aware of how alcohol affects you. ? If alcohol gives you a fast heartbeat, do not drink alcohol. ? If alcohol does not seem to give you a fast heartbeat, limit alcohol use to no more than 1 drink a day for women who are not pregnant, and 2 drinks a day for men. In the [place] one drink is one of these: ? 12 oz of beer (355 mL). ? 5 oz of wine (148 mL). ? 1 oz of hard liquor (44 mL).  Be aware of how caffeine affects you. ? If caffeine gives you a fast heartbeat, do not eat, drink, or use anything with caffeine in it. ? If caffeine does not seem to give you a fast heartbeat, limit how much caffeine you eat, drink, or use.  Do not use stimulant drugs. If you need help quitting, ask your doctor. General instructions  Stay  at a healthy weight.  Exercise regularly. Ask your doctor about good activities for you. Try one or a mixture of these: ? 150 minutes a week of gentle exercise, like walking or yoga. ? 75 minutes a week of exercise that is very active, like running or swimming.  Do vagus nerve treatments to slow down your heartbeat as told by your doctor.  Take over-the-counter and prescription medicines only as told by your doctor.  Keep all follow-up visits as told by your doctor. This is important. Contact a doctor if:  You have a fast heartbeat more often.  Times of having a fast heartbeat last longer than before.  Home treatments to slow down your heartbeat do not help.  You have new symptoms. Get help right away if:  You have chest pain.  Your symptoms get worse.  You have trouble breathing.  Your heart beats very fast for more than 20 minutes.  You pass out. These symptoms may be an emergency. Do not wait to see if the symptoms will go away. Get medical help right away. Call your local emergency services (911 in the [place] Do not drive yourself to the hospital. Summary  SVT is a type of abnormal  heart beat.  This condition can make your heart beat more than 150 times a minute.  Treatment depends on how often the condition happens and your symptoms. This information is not intended to replace advice given to you by your health care provider. Make sure you discuss any questions you have with your health care provider. Document Released: 04/09/2005 Document Revised: 02/25/2018 Document Reviewed: 02/25/2018 Elsevier Patient Education  2020 [employer].

## 2018-12-23 ENCOUNTER — Telehealth: Payer: Self-pay | Admitting: Family

## 2018-12-23 DIAGNOSIS — I471 Supraventricular tachycardia: Secondary | ICD-10-CM

## 2018-12-23 LAB — BASIC METABOLIC PANEL
BUN/Creatinine Ratio: 14 (ref 10–24)
BUN: 14 mg/dL (ref 8–27)
CO2: 24 mmol/L (ref 20–29)
Calcium: 10.5 mg/dL — ABNORMAL HIGH (ref 8.6–10.2)
Chloride: 97 mmol/L (ref 96–106)
Creatinine, Ser: 1.02 mg/dL (ref 0.76–1.27)
GFR calc Af Amer: 87 mL/min/{1.73_m2} (ref >59)
GFR calc non Af Amer: 75 mL/min/{1.73_m2} (ref >59)
Glucose: 143 mg/dL — ABNORMAL HIGH (ref 65–99)
Potassium: 4.3 mmol/L (ref 3.5–5.2)
Sodium: 140 mmol/L (ref 134–144)

## 2018-12-23 LAB — MAGNESIUM: Magnesium: 1.6 mg/dL (ref 1.6–2.3)

## 2018-12-23 MED ORDER — SLOW-MAG 71.5-119 MG PO TBEC: 1.0000 | DELAYED_RELEASE_TABLET | Freq: Two times a day (BID) | ORAL | 5 refills | Status: DC

## 2018-12-23 NOTE — Telephone Encounter (Signed)
Called Mr. Gras, left a voicemail.   Magnesium was low in ED and remains 1.6 despite IV repletion in the ED. Will start on PO magnesium supplement. Slow Mag 1 tab twice daily. Has been sent to his pharmacy. This is an over the counter medication, but his pharmacist can help him find it.   Kidney function normal. Blood glucose elevated - encourage careful monitoring of his diet and management of his diabetes.   Loel Dubonnet, NP

## 2019-03-13 ENCOUNTER — Telehealth: Payer: Self-pay | Admitting: Family

## 2019-03-13 NOTE — Telephone Encounter (Signed)
Second phone call attempted with same result.

## 2019-03-13 NOTE — Telephone Encounter (Signed)
Received call from Dr. Clyde Lundborg office. He was seen today for annual visit and noted to be on duplicate therapy.   He was prescribed Diltiazem 240 mg daily by Dr. Asher Muir.   We were unaware of this medication at office visit 12/22/18 and he was started on Verapamil 120mg  daily.   Called Mr. Kovalenko and left a message for him to call us back. Will plan to relay the following recommendations:   Continue Diltiazem 240mg  daily as prescribed by Dr. Asher Muir  STOP Verapamil 120mg  daily  Schedule follow up with Dr. Agustin Cree that is due for February. If he would like to be sooner, he may.   Loel Dubonnet

## 2019-03-16 NOTE — Telephone Encounter (Signed)
Phoned patient, instructed to stop Verapamil but continue Diltiazem.  Denies dizziness/feeling lightheaded or other sx. Instructed to call our office if any symptoms or questions arise.

## 2019-05-16 ENCOUNTER — Other Ambulatory Visit: Payer: Self-pay | Admitting: Cardiology

## 2019-05-18 ENCOUNTER — Other Ambulatory Visit: Payer: Self-pay | Admitting: Cardiology

## 2019-06-17 ENCOUNTER — Other Ambulatory Visit: Payer: Self-pay

## 2019-06-17 ENCOUNTER — Ambulatory Visit: Payer: Medicare Other | Admitting: Cardiology

## 2019-06-17 ENCOUNTER — Encounter: Payer: Self-pay | Admitting: Cardiology

## 2019-06-17 VITALS — BP 142/64 | HR 94 | Ht 66.0 in | Wt 244.0 lb

## 2019-06-17 DIAGNOSIS — I471 Supraventricular tachycardia: Secondary | ICD-10-CM

## 2019-06-17 DIAGNOSIS — I2510 Atherosclerotic heart disease of native coronary artery without angina pectoris: Secondary | ICD-10-CM | POA: Diagnosis not present

## 2019-06-17 DIAGNOSIS — E118 Type 2 diabetes mellitus with unspecified complications: Secondary | ICD-10-CM

## 2019-06-17 DIAGNOSIS — E785 Hyperlipidemia, unspecified: Secondary | ICD-10-CM

## 2019-06-17 DIAGNOSIS — I10 Essential (primary) hypertension: Secondary | ICD-10-CM | POA: Diagnosis not present

## 2019-06-17 LAB — LAB REPORT - SCANNED
A1c: 9.3
EGFR: 102

## 2019-06-17 NOTE — Patient Instructions (Signed)

## 2019-06-17 NOTE — Progress Notes (Signed)
Cardiology Office Note:    Date:  06/17/2019   ID:  Tyler Ellis, DOB 11-Apr-1951, MRN [id number]  PCP:  Charleston Poot, MD    Cardiologist:  Jenne Campus, MD    Referring MD: Charleston Poot, MD   No chief complaint on file. Doing well  History of Present Illness:    Tyler Ellis is a 69 y.o. male past medical history significant for work SVT, type 2 diabetes, remote coronary artery disease.  He presented to our office for follow-up.  Overall seems to be doing well.  He said for last 2 to 3 weeks he feels best ever.  He is able to walk climb stairs have no difficulty doing it.  Denies having any arrhythmia.  Sometimes he is aware of his heart beating forcefully after he eats heavy meal.      Past Medical History:  Diagnosis Date  . Coronary artery disease   . Diabetes mellitus without complication ([place])   . Hypertension          Past Surgical History:  Procedure Laterality Date  . KNEE SURGERY    . LEFT HEART CATH AND CORONARY ANGIOGRAPHY N/A 02/28/2017   Procedure: LEFT HEART CATH AND CORONARY ANGIOGRAPHY;  Surgeon: Burnell Blanks, MD;  Location: [facility];  Service: Cardiovascular;  Laterality: N/A;    Current Medications: Active Medications      Current Meds  Medication Sig  . acetaminophen-codeine (TYLENOL #3) 300-30 MG tablet Take 1 tablet every 8 (eight) hours as needed by mouth for moderate pain.   Marland Kitchen aspirin EC 81 MG EC tablet Take 1 tablet (81 mg total) daily by mouth.  Marland Kitchen atorvastatin (LIPITOR) 80 MG tablet Take 1 tablet (80 mg total) daily by mouth.  . diazepam (VALIUM) 5 MG tablet Take 2.5-5 mg 3 (three) times daily as needed by mouth for anxiety.   Marland Kitchen glipiZIDE (GLUCOTROL) 10 MG tablet Take 10 mg 2 (two) times daily before a meal by mouth.   . hydrochlorothiazide (HYDRODIURIL) 25 MG tablet Take 25 mg daily by mouth.  Marland Kitchen lisinopril (PRINIVIL,ZESTRIL) 40 MG tablet Take 40 mg daily by mouth.  . metFORMIN (GLUCOPHAGE) 500 MG  tablet Take 1 tablet (500 mg total) 2 (two) times daily with a meal by mouth. Resume on 03/03/17.  . nitroGLYCERIN (NITROSTAT) 0.4 MG SL tablet Place 1 tablet (0.4 mg total) under the tongue every 5 (five) minutes as needed for chest pain.  . [DISCONTINUED] metoprolol succinate (TOPROL-XL) 50 MG 24 hr tablet TAKE 3 TABLETS BY MOUTH DAILY WITH OR IMMEDIATELY FOLLOWING A MEAL       Allergies:   Patient has no known allergies.   Social History        Socioeconomic History  . Marital status: Divorced    Spouse name: Not on file  . Number of children: Not on file  . Years of education: Not on file  . Highest education level: Not on file  Occupational History  . Not on file  Tobacco Use  . Smoking status: Never Smoker  . Smokeless tobacco: Never Used  Substance and Sexual Activity  . Alcohol use: Yes    Comment: weekly  . Drug use: No  . Sexual activity: Not on file  Other Topics Concern  . Not on file  Social History Narrative  . Not on file   Social Determinants of Health      Financial Resource Strain:   . Difficulty of Paying Living Expenses: Not on file  Food Insecurity:   . Worried About Programme researcher, broadcasting/film/video in the Last Year: Not on file  . Ran Out of Food in the Last Year: Not on file  Transportation Needs:   . Lack of Transportation (Medical): Not on file  . Lack of Transportation (Non-Medical): Not on file  Physical Activity:   . Days of Exercise per Week: Not on file  . Minutes of Exercise per Session: Not on file  Stress:   . Feeling of Stress : Not on file  Social Connections:   . Frequency of Communication with Friends and Family: Not on file  . Frequency of Social Gatherings with Friends and Family: Not on file  . Attends Religious Services: Not on file  . Active Member of Clubs or Organizations: Not on file  . Attends Banker Meetings: Not on file  . Marital Status: Not on file     Family History: The patient's family history  includes Hypertension in his father and mother. ROS:   Please see the history of present illness.    All 14 point review of systems negative except as described per history of present illness  EKGs/Labs/Other Studies Reviewed:    The following studies were reviewed today:  Cath 02/2017  Prox RCA lesion is 10% stenosed.  Mid RCA lesion is 10% stenosed.  The left ventricular systolic function is normal.  LV end diastolic pressure is normal.  The left ventricular ejection fraction is greater than 65% by visual estimate.  There is mild (2+) mitral regurgitation.  1. Mild non-obstructive CAD 2. Normal LV systolic function with normal filling pressures  Recommendations: No further ischemic evaluation.  Lexiscan 02/2018  Nuclear stress EF: 69%.  There was no ST segment deviation noted during stress.  No T wave inversion was noted during stress.  This is a low risk study.  Normal perfusion. LVEF 69% with normal wall motion. This is a low risk study  Echo 05/2018 1. The left ventricle has normal systolic function, with an ejection fraction of 55-60%. The cavity size was normal. There is moderate concentric left ventricular hypertrophy. Left ventricular diastolic Doppler parameters are consistent with impaired  relaxation Elevated left ventricular end-diastolic pressure No evidence of left ventricular regional wall motion abnormalities. 2. The right ventricle has normal systolic function. The cavity was normal. There is no increase in right ventricular wall thickness. 3. The mitral valve is normal in structure. No evidence of mitral valve stenosis. 4. The tricuspid valve is normal in structure. 5. The aortic valve is normal in structure. no stenosis of the aortic valve. 6. The pulmonic valve was normal in structure. 7. The aortic root, ascending aorta and aortic arch are normal in size and structure. 8. No evidence of left ventricular regional wall motion  abnormalities.   Recent Labs: 12/13/2018: Hemoglobin 15.7; Platelets 186 12/22/2018: BUN 14; Creatinine, Ser 1.02; Magnesium 1.6; Potassium 4.3; Sodium 140  Recent Lipid Panel Labs (Brief)          Component Value Date/Time   CHOL 191 02/28/2017 0017   TRIG 288 (H) 02/28/2017 0017   HDL 52 02/28/2017 0017   CHOLHDL 3.7 02/28/2017 0017   VLDL 58 (H) 02/28/2017 0017   LDLCALC 81 02/28/2017 0017      Physical Exam:    VS:  BP (!) 152/86   Pulse 91   Ht 5\' 6"  (1.676 m)   Wt 236 lb (107 kg)   SpO2 96%   BMI 38.09 kg/m  Wt Readings from Last 3 Encounters:  06/17/19 244 lb (110.7 kg)  12/22/18 236 lb (107 kg)  11/17/18 236 lb 1.9 oz (107.1 kg)     GEN:  Well nourished, well developed in no acute distress HEENT: Normal NECK: No JVD; No carotid bruits LYMPHATICS: No lymphadenopathy CARDIAC: RRR, no murmurs, no rubs, no gallops RESPIRATORY:  Clear to auscultation without rales, wheezing or rhonchi  ABDOMEN: Soft, non-tender, non-distended MUSCULOSKELETAL:  No edema; No deformity  SKIN: Warm and dry LOWER EXTREMITIES: no swelling NEUROLOGIC:  Alert and oriented x 3 PSYCHIATRIC:  Normal affect   ASSESSMENT:    1. Supraventricular tachycardia (HCC)   2. Coronary artery disease involving native coronary artery of native heart without angina pectoris   3. Dyslipidemia   4. Essential hypertension    PLAN:    In order of problems listed above:  1. Supraventricular tachycardia denies having an episode since the time he was seen him last time.  We will continue beta-blockade as well as diltiazem.  I will do EKG today to make sure PR interval is not significantly prolonged. 2. Coronary artery disease cardiac catheterization from 2018 showed only some luminal disease.  We will continue risk factors modifications. 3. Dyslipidemia.  She is on Lipitor 80 which I will continue.  Will check his fasting lipid profile. 4. Essential hypertension seems to be  controlled we will continue present management.   Medication Adjustments/Labs and Tests Ordered: Current medicines are reviewed at length with the patient today.  Concerns regarding medicines are outlined above.     Orders Placed This Encounter  Procedures  . Magnesium  . Basic Metabolic Panel (BMET)  . EKG 12-Lead   Medication changes:       Meds ordered this encounter  Medications  . DISCONTD: verapamil (CALAN-SR) 120 MG CR tablet    Sig: Take 1 tablet (120 mg total) by mouth at bedtime.    Dispense:  90 tablet    Refill:  1    Order Specific Question:   Supervising Provider    Answer:   Baldo Daub [id number]  . DISCONTD: verapamil (CALAN-SR) 120 MG CR tablet    Sig: Take 1 tablet (120 mg total) by mouth at bedtime.    Dispense:  90 tablet    Refill:  1    Signed, Georgeanna Lea, MD, [facility] 06/17/2019 2:26 PM    [facility]

## 2019-06-17 NOTE — Addendum Note (Signed)
Addended by: Lita Mains on: 06/17/2019 02:43 PM   Modules accepted: Orders

## 2019-06-17 NOTE — Progress Notes (Signed)
Cardiology Office Note:    Date:  06/17/2019   ID:  Tyler Ellis, DOB 1950/05/16, MRN [id number]  PCP:  Forrest Moron, MD  Cardiologist:  Gypsy Balsam, MD    Referring MD: Forrest Moron, MD   No chief complaint on file. Doing well  History of Present Illness:    Tyler Ellis is a 69 y.o. male past medical history significant for work SVT, type 2 diabetes, remote coronary artery disease.  He presented to our office for follow-up.  Overall seems to be doing well.  He said for last 2 to 3 weeks he feels best ever.  He is able to walk climb stairs have no difficulty doing it.  Denies having any arrhythmia.  Sometimes he is aware of his heart beating forcefully after he eats heavy meal.  Past Medical History:  Diagnosis Date  . Coronary artery disease   . Diabetes mellitus without complication (HCC)   . Hypertension     Past Surgical History:  Procedure Laterality Date  . KNEE SURGERY    . LEFT HEART CATH AND CORONARY ANGIOGRAPHY N/A 02/28/2017   Procedure: LEFT HEART CATH AND CORONARY ANGIOGRAPHY;  Surgeon: Kathleene Hazel, MD;  [facility]: [facility];  Service: Cardiovascular;  Laterality: N/A;    Current Medications: Current Meds  Medication Sig  . acetaminophen-codeine (TYLENOL #3) 300-30 MG tablet Take 1 tablet every 8 (eight) hours as needed by mouth for moderate pain.   Marland Kitchen aspirin EC 81 MG EC tablet Take 1 tablet (81 mg total) daily by mouth.  Marland Kitchen atorvastatin (LIPITOR) 80 MG tablet Take 1 tablet (80 mg total) daily by mouth.  . diazepam (VALIUM) 5 MG tablet Take 2.5-5 mg 3 (three) times daily as needed by mouth for anxiety.   Marland Kitchen glipiZIDE (GLUCOTROL) 10 MG tablet Take 10 mg 2 (two) times daily before a meal by mouth.   . hydrochlorothiazide (HYDRODIURIL) 25 MG tablet Take 25 mg daily by mouth.  Marland Kitchen lisinopril (PRINIVIL,ZESTRIL) 40 MG tablet Take 40 mg daily by mouth.  . metFORMIN (GLUCOPHAGE) 500 MG tablet Take 1 tablet (500 mg total) 2 (two) times daily  with a meal by mouth. Resume on 03/03/17.  . nitroGLYCERIN (NITROSTAT) 0.4 MG SL tablet Place 1 tablet (0.4 mg total) under the tongue every 5 (five) minutes as needed for chest pain.  . [DISCONTINUED] metoprolol succinate (TOPROL-XL) 50 MG 24 hr tablet TAKE 3 TABLETS BY MOUTH DAILY WITH OR IMMEDIATELY FOLLOWING A MEAL     Allergies:   Patient has no known allergies.   Social History   Socioeconomic History  . Marital status: Divorced    Spouse name: Not on file  . Number of children: Not on file  . Years of education: Not on file  . Highest education level: Not on file  Occupational History  . Not on file  Tobacco Use  . Smoking status: Never Smoker  . Smokeless tobacco: Never Used  Substance and Sexual Activity  . Alcohol use: Yes    Comment: weekly  . Drug use: No  . Sexual activity: Not on file  Other Topics Concern  . Not on file  Social History Narrative  . Not on file   Social Determinants of Health   Financial Resource Strain:   . Difficulty of Paying Living Expenses: Not on file  Food Insecurity:   . Worried About Programme researcher, broadcasting/film/video in the Last Year: Not on file  . Ran Out of Food in the Last Year:  Not on file  Transportation Needs:   . Lack of Transportation (Medical): Not on file  . Lack of Transportation (Non-Medical): Not on file  Physical Activity:   . Days of Exercise per Week: Not on file  . Minutes of Exercise per Session: Not on file  Stress:   . Feeling of Stress : Not on file  Social Connections:   . Frequency of Communication with Friends and Family: Not on file  . Frequency of Social Gatherings with Friends and Family: Not on file  . Attends Religious Services: Not on file  . Active Member of Clubs or Organizations: Not on file  . Attends Banker Meetings: Not on file  . Marital Status: Not on file     Family History: The patient's family history includes Hypertension in his father and mother. ROS:   Please see the history  of present illness.    All 14 point review of systems negative except as described per history of present illness  EKGs/Labs/Other Studies Reviewed:    The following studies were reviewed today:  Cath 02/2017  Prox RCA lesion is 10% stenosed.  Mid RCA lesion is 10% stenosed.  The left ventricular systolic function is normal.  LV end diastolic pressure is normal.  The left ventricular ejection fraction is greater than 65% by visual estimate.  There is mild (2+) mitral regurgitation.  1. Mild non-obstructive CAD 2. Normal LV systolic function with normal filling pressures  Recommendations: No further ischemic evaluation.   Lexiscan 02/2018  Nuclear stress EF: 69%.  There was no ST segment deviation noted during stress.  No T wave inversion was noted during stress.  This is a low risk study.  Normal perfusion. LVEF 69% with normal wall motion. This is a low risk study  Echo 05/2018 1. The left ventricle has normal systolic function, with an ejection fraction of 55-60%. The cavity size was normal. There is moderate concentric left ventricular hypertrophy. Left ventricular diastolic Doppler parameters are consistent with impaired  relaxation Elevated left ventricular end-diastolic pressure No evidence of left ventricular regional wall motion abnormalities. 2. The right ventricle has normal systolic function. The cavity was normal. There is no increase in right ventricular wall thickness. 3. The mitral valve is normal in structure. No evidence of mitral valve stenosis. 4. The tricuspid valve is normal in structure. 5. The aortic valve is normal in structure. no stenosis of the aortic valve. 6. The pulmonic valve was normal in structure. 7. The aortic root, ascending aorta and aortic arch are normal in size and structure. 8. No evidence of left ventricular regional wall motion abnormalities.   Recent Labs: 12/13/2018: Hemoglobin 15.7; Platelets 186 12/22/2018:  BUN 14; Creatinine, Ser 1.02; Magnesium 1.6; Potassium 4.3; Sodium 140  Recent Lipid Panel    Component Value Date/Time   CHOL 191 02/28/2017 0017   TRIG 288 (H) 02/28/2017 0017   HDL 52 02/28/2017 0017   CHOLHDL 3.7 02/28/2017 0017   VLDL 58 (H) 02/28/2017 0017   LDLCALC 81 02/28/2017 0017    Physical Exam:    VS:  BP (!) 152/86   Pulse 91   Ht 5\' 6"  (1.676 m)   Wt 236 lb (107 kg)   SpO2 96%   BMI 38.09 kg/m     Wt Readings from Last 3 Encounters:  06/17/19 244 lb (110.7 kg)  12/22/18 236 lb (107 kg)  11/17/18 236 lb 1.9 oz (107.1 kg)     GEN:  Well nourished, well  developed in no acute distress HEENT: Normal NECK: No JVD; No carotid bruits LYMPHATICS: No lymphadenopathy CARDIAC: RRR, no murmurs, no rubs, no gallops RESPIRATORY:  Clear to auscultation without rales, wheezing or rhonchi  ABDOMEN: Soft, non-tender, non-distended MUSCULOSKELETAL:  No edema; No deformity  SKIN: Warm and dry LOWER EXTREMITIES: no swelling NEUROLOGIC:  Alert and oriented x 3 PSYCHIATRIC:  Normal affect   ASSESSMENT:    1. Supraventricular tachycardia ([place])   2. Coronary artery disease involving native coronary artery of native heart without angina pectoris   3. Dyslipidemia   4. Essential hypertension    PLAN:    In order of problems listed above:  1. Supraventricular tachycardia denies having an episode since the time he was seen him last time.  We will continue beta-blockade as well as diltiazem.  I will do EKG today to make sure PR interval is not significantly prolonged. 2. Coronary artery disease cardiac catheterization from 2018 showed only some luminal disease.  We will continue risk factors modifications. 3. Dyslipidemia.  She is on Lipitor 80 which I will continue.  Will check his fasting lipid profile. 4. Essential hypertension seems to be controlled we will continue present management.   Medication Adjustments/Labs and Tests Ordered: Current medicines are reviewed at  length with the patient today.  Concerns regarding medicines are outlined above.  Orders Placed This Encounter  Procedures  . Magnesium  . Basic Metabolic Panel (BMET)  . EKG 12-Lead   Medication changes:  Meds ordered this encounter  Medications  . DISCONTD: verapamil (CALAN-SR) 120 MG CR tablet    Sig: Take 1 tablet (120 mg total) by mouth at bedtime.    Dispense:  90 tablet    Refill:  1    Order Specific Question:   Supervising Provider    Answer:   Richardo Priest [id number]  . DISCONTD: verapamil (CALAN-SR) 120 MG CR tablet    Sig: Take 1 tablet (120 mg total) by mouth at bedtime.    Dispense:  90 tablet    Refill:  1    Signed, Park Liter, MD, [facility] 06/17/2019 2:26 PM    [place]

## 2019-08-31 ENCOUNTER — Other Ambulatory Visit: Payer: Self-pay | Admitting: Cardiology

## 2019-09-16 LAB — HEMOGLOBIN A1C: A1c: 7.3

## 2019-09-23 ENCOUNTER — Encounter: Payer: Self-pay | Admitting: Cardiology

## 2019-10-27 ENCOUNTER — Ambulatory Visit: Payer: Medicare Other | Admitting: Cardiology

## 2019-10-28 ENCOUNTER — Encounter: Payer: Self-pay | Admitting: Cardiology

## 2019-10-28 ENCOUNTER — Other Ambulatory Visit: Payer: Self-pay

## 2019-10-28 ENCOUNTER — Ambulatory Visit: Payer: Medicare Other | Admitting: Cardiology

## 2019-10-28 VITALS — BP 138/87 | HR 84 | Ht 66.0 in | Wt 239.0 lb

## 2019-10-28 DIAGNOSIS — N289 Disorder of kidney and ureter, unspecified: Secondary | ICD-10-CM

## 2019-10-28 DIAGNOSIS — R0789 Other chest pain: Secondary | ICD-10-CM

## 2019-10-28 DIAGNOSIS — I2510 Atherosclerotic heart disease of native coronary artery without angina pectoris: Secondary | ICD-10-CM

## 2019-10-28 DIAGNOSIS — R002 Palpitations: Secondary | ICD-10-CM | POA: Insufficient documentation

## 2019-10-28 DIAGNOSIS — I471 Supraventricular tachycardia: Secondary | ICD-10-CM | POA: Diagnosis not present

## 2019-10-28 DIAGNOSIS — I10 Essential (primary) hypertension: Secondary | ICD-10-CM

## 2019-10-28 DIAGNOSIS — E785 Hyperlipidemia, unspecified: Secondary | ICD-10-CM | POA: Diagnosis not present

## 2019-10-28 HISTORY — DX: Other chest pain: R07.89

## 2019-10-28 HISTORY — DX: Palpitations: R00.2

## 2019-10-28 NOTE — Progress Notes (Signed)
Cardiology Office Note:    Date:  10/28/2019   ID:  Tyler Ellis, DOB 1950/08/23, MRN [id number]  PCP:  Forrest Moron, MD  Cardiologist:  Gypsy Balsam, MD    Referring MD: Forrest Moron, MD   No chief complaint on file. I have shortness of breath and palpitations  History of Present Illness:    Tyler Ellis is a 69 y.o. male with past medical history significant for coronary disease in 2018 he did have cardiac catheterization done which showed only luminal disease.  He also history of SVT successfully managed with medications.  He comes today 2 months for follow-up he is not feeling well he says many times when he lays down at any time into the bed he started feeling chest sensation.  Does not but she can do to make it better.  He is afraid to take nitroglycerin.  It never happened with exercise always when he tried to lay down in the bed.  Typically would first happen when he lays down is that he started feeling his heart speeding up this is kind of different sensation that he got before he got SVT but still concerning then he still having shortness of breath and chest sensation.  He typically takes some negative.  He said in that he goes to sleep he does not wake up in the middle of night because of this he does not have any of this while he walks and he is able to do quite aggressive exercise.  He can cut grass using a push mower and have no difficulty doing it.  Past Medical History:  Diagnosis Date   Coronary artery disease    Diabetes mellitus without complication (HCC)    Hypertension     Past Surgical History:  Procedure Laterality Date   KNEE SURGERY     LEFT HEART CATH AND CORONARY ANGIOGRAPHY N/A 02/28/2017   Procedure: LEFT HEART CATH AND CORONARY ANGIOGRAPHY;  Surgeon: Kathleene Hazel, MD;  Location: [facility];  Service: Cardiovascular;  Laterality: N/A;    Current Medications: Current Meds  Medication Sig   acetaminophen-codeine (TYLENOL  #3) 300-30 MG tablet Take 1 tablet every 8 (eight) hours as needed by mouth for moderate pain.    aspirin EC 81 MG EC tablet Take 1 tablet (81 mg total) daily by mouth.   atorvastatin (LIPITOR) 80 MG tablet Take 1 tablet (80 mg total) daily by mouth.   diazepam (VALIUM) 5 MG tablet Take 2.5-5 mg 3 (three) times daily as needed by mouth for anxiety.    diltiazem (DILACOR XR) 240 MG 24 hr capsule Take 240 mg by mouth daily.   glipiZIDE (GLUCOTROL) 10 MG tablet Take 10 mg 2 (two) times daily before a meal by mouth.    hydrochlorothiazide (HYDRODIURIL) 25 MG tablet Take 25 mg daily by mouth.   lisinopril (PRINIVIL,ZESTRIL) 40 MG tablet Take 40 mg daily by mouth.   magnesium chloride (SLOW-MAG) 64 MG TBEC SR tablet Take 1 tablet (64 mg total) by mouth 2 (two) times daily.   metFORMIN (GLUCOPHAGE) 500 MG tablet Take 1 tablet (500 mg total) 2 (two) times daily with a meal by mouth. Resume on 03/03/17.   metoprolol succinate (TOPROL-XL) 50 MG 24 hr tablet TAKE 3 TABLETS BY MOUTH DAILY WITH OR IMMEDIATELY FOLLOWING A MEAL     Allergies:   Patient has no known allergies.   Social History   Socioeconomic History   Marital status: Divorced    Spouse name: Not on  file   Number of children: Not on file   Years of education: Not on file   Highest education level: Not on file  Occupational History   Not on file  Tobacco Use   Smoking status: Never Smoker   Smokeless tobacco: Never Used  Vaping Use   Vaping Use: Never used  Substance and Sexual Activity   Alcohol use: Yes    Comment: weekly   Drug use: No   Sexual activity: Not on file  Other Topics Concern   Not on file  Social History Narrative   Not on file   Social Determinants of Health   Financial Resource Strain:    Difficulty of Paying Living Expenses:   Food Insecurity:    Worried About Programme researcher, broadcasting/film/video in the Last Year:    Barista in the Last Year:   Transportation Needs:    Automotive engineer (Medical):    Lack of Transportation (Non-Medical):   Physical Activity:    Days of Exercise per Week:    Minutes of Exercise per Session:   Stress:    Feeling of Stress :   Social Connections:    Frequency of Communication with Friends and Family:    Frequency of Social Gatherings with Friends and Family:    Attends Religious Services:    Active Member of Clubs or Organizations:    Attends Banker Meetings:    Marital Status:      Family History: The patient's family history includes Hypertension in his father and mother. ROS:   Please see the history of present illness.    All 14 point review of systems negative except as described per history of present illness  EKGs/Labs/Other Studies Reviewed:      Recent Labs: 12/13/2018: Hemoglobin 15.7; Platelets 186 12/22/2018: BUN 14; Creatinine, Ser 1.02; Magnesium 1.6; Potassium 4.3; Sodium 140  Recent Lipid Panel    Component Value Date/Time   CHOL 191 02/28/2017 0017   TRIG 288 (H) 02/28/2017 0017   HDL 52 02/28/2017 0017   CHOLHDL 3.7 02/28/2017 0017   VLDL 58 (H) 02/28/2017 0017   LDLCALC 81 02/28/2017 0017    Physical Exam:    VS:  BP 138/87 (BP Location: Right Arm, Patient Position: Sitting, Cuff Size: Large)    Pulse 84    Ht 5\' 6"  (1.676 m)    Wt 239 lb (108.4 kg)    SpO2 95%    BMI 38.58 kg/m     Wt Readings from Last 3 Encounters:  10/28/19 239 lb (108.4 kg)  06/17/19 244 lb (110.7 kg)  12/22/18 236 lb (107 kg)     GEN:  Well nourished, well developed in no acute distress HEENT: Normal NECK: No JVD; No carotid bruits LYMPHATICS: No lymphadenopathy CARDIAC: RRR, no murmurs, no rubs, no gallops RESPIRATORY:  Clear to auscultation without rales, wheezing or rhonchi  ABDOMEN: Soft, non-tender, non-distended MUSCULOSKELETAL:  No edema; No deformity  SKIN: Warm and dry LOWER EXTREMITIES: 1+ swelling NEUROLOGIC:  Alert and oriented x 3 PSYCHIATRIC:  Normal affect    ASSESSMENT:    1. Supraventricular tachycardia (HCC)   2. Essential hypertension   3. Coronary artery disease involving native coronary artery of native heart without angina pectoris   4. Dyslipidemia   5. Atypical chest pain    PLAN:    In order of problems listed above:  1. Supraventricular tachycardia he does have some palpitation look different compared to the one before.  I will ask you to wear Zio patch for a week to see if there is any significant arrhythmia. 2. Essential hypertension blood pressure seems to be well controlled continue present management. 3. Coronary artery disease with luminal disease by cardiac cath from 2018.  We will continue present management. 4. Dyslipidemia: I did review his K PN which showed me his LDL of 81 and HDL 52 this is actually from 2 years ago.  We will try to get new results. 5. Dyspnea when he is laying down of course concern is about paroxysmal nocturnal dyspnea cough.  Also physical exam revealed mild swelling of lower extremities.  I will get echocardiogram make sure were not dealing with CHF.   Medication Adjustments/Labs and Tests Ordered: Current medicines are reviewed at length with the patient today.  Concerns regarding medicines are outlined above.  No orders of the defined types were placed in this encounter.  Medication changes: No orders of the defined types were placed in this encounter.   Signed, Georgeanna Lea, MD, [facility] 10/28/2019 3:02 PM    [facility]

## 2019-10-28 NOTE — Patient Instructions (Addendum)
Medication Instructions:  Your physician recommends that you continue on your current medications as directed. Please refer to the Current Medication list given to you today.  *If you need a refill on your cardiac medications before your next appointment, please call your pharmacy*   Lab Work: TODAY: BMET, BNP If you have labs (blood work) drawn today and your tests are completely normal, you will receive your results only by: Marland Kitchen MyChart Message (if you have MyChart) OR . A paper copy in the mail If you have any lab test that is abnormal or we need to change your treatment, we will call you to review the results.   Testing/Procedures: Your physician has requested that you have an echocardiogram. Echocardiography is a painless test that uses sound waves to create images of your heart. It provides your doctor with information about the size and shape of your heart and how well your heart's chambers and valves are working. This procedure takes approximately one hour. There are no restrictions for this procedure.  Your physician has recommended that you wear an ZIO event monitor FOR 1 WEEK. Event monitors are medical devices that record the heart's electrical activity. Doctors most often [place] these monitors to diagnose arrhythmias. Arrhythmias are problems with the speed or rhythm of the heartbeat. The monitor is a small, portable device. You can wear one while you do your normal daily activities. This is usually used to diagnose what is causing palpitations/syncope (passing out).  Follow-Up: At [facility], you and your health needs are our priority.  As part of our continuing mission to provide you with exceptional heart care, we have created designated Provider Care Teams.  These Care Teams include your primary Cardiologist (physician) and Advanced Practice Providers (APPs -  Physician Assistants and Nurse Practitioners) who all work together to provide you with the care you need, when you need  it.  We recommend signing up for the patient portal called "MyChart".  Sign up information is provided on this After Visit Summary.  MyChart is used to connect with patients for Virtual Visits (Telemedicine).  Patients are able to view lab/test results, encounter notes, upcoming appointments, etc.  Non-urgent messages can be sent to your provider as well.   To learn more about what you can do with MyChart, go to [email or web address].    Your next appointment:   6 week(s)  The format for your next appointment:   In Person  Provider:   Gypsy Balsam, MD

## 2019-10-29 LAB — BASIC METABOLIC PANEL
BUN/Creatinine Ratio: 14 (ref 10–24)
BUN: 20 mg/dL (ref 8–27)
CO2: 25 mmol/L (ref 20–29)
Calcium: 10.1 mg/dL (ref 8.6–10.2)
Chloride: 98 mmol/L (ref 96–106)
Creatinine, Ser: 1.43 mg/dL — ABNORMAL HIGH (ref 0.76–1.27)
GFR calc Af Amer: 58 mL/min/{1.73_m2} — ABNORMAL LOW (ref >59)
GFR calc non Af Amer: 50 mL/min/{1.73_m2} — ABNORMAL LOW (ref >59)
Glucose: 145 mg/dL — ABNORMAL HIGH (ref 65–99)
Potassium: 4.1 mmol/L (ref 3.5–5.2)
Sodium: 139 mmol/L (ref 134–144)

## 2019-10-29 LAB — PRO B NATRIURETIC PEPTIDE: NT-Pro BNP: 81 pg/mL (ref 0–376)

## 2019-11-10 NOTE — Addendum Note (Signed)
Addended by: Eleonore Chiquito on: 11/10/2019 10:11 AM   Modules accepted: Orders

## 2019-11-18 LAB — BASIC METABOLIC PANEL
BUN/Creatinine Ratio: 15 (ref 10–24)
BUN: 16 mg/dL (ref 8–27)
CO2: 22 mmol/L (ref 20–29)
Calcium: 10 mg/dL (ref 8.6–10.2)
Chloride: 97 mmol/L (ref 96–106)
Creatinine, Ser: 1.07 mg/dL (ref 0.76–1.27)
GFR calc Af Amer: 82 mL/min/{1.73_m2} (ref >59)
GFR calc non Af Amer: 71 mL/min/{1.73_m2} (ref >59)
Glucose: 120 mg/dL — ABNORMAL HIGH (ref 65–99)
Potassium: 4.1 mmol/L (ref 3.5–5.2)
Sodium: 140 mmol/L (ref 134–144)

## 2019-11-26 ENCOUNTER — Telehealth: Payer: Self-pay | Admitting: Cardiology

## 2019-11-26 NOTE — Telephone Encounter (Signed)
I do not see anything testing that we would call this pt for or any new medications. Did you call him?

## 2019-11-26 NOTE — Telephone Encounter (Signed)
Left message for patient to return call.

## 2019-11-26 NOTE — Telephone Encounter (Signed)
Patient states he received a call about a procedure someone wants to explain to him.  As well as a call from the pharmacy telling him medication they have for him he said he knows nothing about this.

## 2019-11-27 ENCOUNTER — Telehealth: Payer: Self-pay

## 2019-11-27 NOTE — Telephone Encounter (Signed)
Tried calling patient. No answer and no voicemail set up for me to leave a message. 

## 2019-11-27 NOTE — Telephone Encounter (Signed)
-----   Message from Georgeanna Lea, MD sent at 11/26/2019  2:49 PM EDT ----- Blood work looks good.  Continue present management

## 2019-12-02 NOTE — Telephone Encounter (Signed)
Left message for patient to return call.

## 2019-12-11 ENCOUNTER — Other Ambulatory Visit: Payer: Self-pay

## 2019-12-11 ENCOUNTER — Ambulatory Visit (HOSPITAL_BASED_OUTPATIENT_CLINIC_OR_DEPARTMENT_OTHER)
Admission: RE | Admit: 2019-12-11 | Discharge: 2019-12-11 | Disposition: A | Discharge status: Home / Self Care | Payer: Medicare Other | Source: Ambulatory Visit | Attending: Cardiology | Admitting: Cardiology

## 2019-12-11 DIAGNOSIS — R0789 Other chest pain: Secondary | ICD-10-CM | POA: Diagnosis present

## 2019-12-11 DIAGNOSIS — I2510 Atherosclerotic heart disease of native coronary artery without angina pectoris: Secondary | ICD-10-CM | POA: Insufficient documentation

## 2019-12-11 DIAGNOSIS — I10 Essential (primary) hypertension: Secondary | ICD-10-CM | POA: Insufficient documentation

## 2019-12-11 DIAGNOSIS — I471 Supraventricular tachycardia: Secondary | ICD-10-CM

## 2019-12-11 LAB — ECHOCARDIOGRAM COMPLETE
AR max vel: 2.85 cm2
AV Area VTI: 3.18 cm2
AV Area mean vel: 2.93 cm2
AV Mean grad: 8 mmHg
AV Peak grad: 15.1 mmHg
Ao pk vel: 1.94 m/s
Area-P 1/2: 3.42 cm2
Calc EF: 52.8 %
S' Lateral: 2.59 cm
Single Plane A2C EF: 55.5 %
Single Plane A4C EF: 60 %

## 2019-12-16 ENCOUNTER — Telehealth: Payer: Self-pay | Admitting: Cardiology

## 2019-12-16 NOTE — Telephone Encounter (Signed)
Follow Up: ° ° ° °Returning your call, concerning his results. °

## 2019-12-16 NOTE — Telephone Encounter (Signed)
Left message for patient to return call. This is third attempt that was unsuccessful. Will remove from work que.

## 2019-12-18 LAB — HEMOGLOBIN A1C: A1c: 7.2

## 2019-12-18 NOTE — Telephone Encounter (Signed)
Left message for patient to return call.

## 2019-12-18 NOTE — Telephone Encounter (Signed)
Called patient back again, no answer. Left results on patient's voicemail per dpr advised him to call with any questions.

## 2019-12-18 NOTE — Telephone Encounter (Signed)
Pt returned this office call for his results please give him a call back.

## 2019-12-21 ENCOUNTER — Ambulatory Visit: Payer: Medicare Other | Admitting: Cardiology

## 2019-12-21 ENCOUNTER — Other Ambulatory Visit: Payer: Self-pay

## 2019-12-21 ENCOUNTER — Encounter: Payer: Self-pay | Admitting: Cardiology

## 2019-12-21 VITALS — BP 150/92 | HR 80 | Ht 66.0 in | Wt 244.0 lb

## 2019-12-21 DIAGNOSIS — R002 Palpitations: Secondary | ICD-10-CM

## 2019-12-21 DIAGNOSIS — I471 Supraventricular tachycardia: Secondary | ICD-10-CM | POA: Diagnosis not present

## 2019-12-21 DIAGNOSIS — I2510 Atherosclerotic heart disease of native coronary artery without angina pectoris: Secondary | ICD-10-CM

## 2019-12-21 DIAGNOSIS — I10 Essential (primary) hypertension: Secondary | ICD-10-CM

## 2019-12-21 DIAGNOSIS — E785 Hyperlipidemia, unspecified: Secondary | ICD-10-CM

## 2019-12-21 DIAGNOSIS — R0789 Other chest pain: Secondary | ICD-10-CM

## 2019-12-21 MED ORDER — DILTIAZEM HCL ER COATED BEADS 360 MG PO CP24: 360.0000 mg | ORAL_CAPSULE | Freq: Every day | ORAL | 1 refills | Status: DC

## 2019-12-21 NOTE — Addendum Note (Signed)
Addended by: Lita Mains on: 12/21/2019 02:59 PM   Modules accepted: Orders

## 2019-12-21 NOTE — Progress Notes (Signed)
Cardiology Office Note:    Date:  12/21/2019   ID:  Tyler Ellis, DOB 05-Aug-1950, MRN [id number]  PCP:  Forrest Moron, MD  Cardiologist:  Gypsy Balsam, MD    Referring MD: Forrest Moron, MD   Chief Complaint  Patient presents with  . Follow-up  Doing well  History of Present Illness:    Tyler Ellis is a 69 y.o. male coming to the office for follow-up he is possible history significant for supraventricular tachycardia, successfully suppressed with calcium channel blocker, also essential hypertension, diabetes, dyslipidemia.  Comes today 2 months of follow-up.  Previously he was complaining of having some strange sensation when he is laying down at evening time.  We will repeat echocardiogram which showed preserved left ventricle ejection fraction, he was identified to have mildly enlarged aorta measuring 38 mm.  He overall said feeling better.  Denies have any palpitation that he described before no chest pain tightness squeezing pressure burning chest.  Past Medical History:  Diagnosis Date  . Coronary artery disease   . Diabetes mellitus without complication (HCC)   . Hypertension     Past Surgical History:  Procedure Laterality Date  . KNEE SURGERY    . LEFT HEART CATH AND CORONARY ANGIOGRAPHY N/A 02/28/2017   Procedure: LEFT HEART CATH AND CORONARY ANGIOGRAPHY;  Surgeon: Kathleene Hazel, MD;  Location: [facility];  Service: Cardiovascular;  Laterality: N/A;    Current Medications: Current Meds  Medication Sig  . acetaminophen-codeine (TYLENOL #3) 300-30 MG tablet Take 1 tablet every 8 (eight) hours as needed by mouth for moderate pain.   Marland Kitchen ALPRAZolam (XANAX) 0.5 MG tablet Take 0.25-0.5 mg by mouth 3 (three) times daily as needed.  Marland Kitchen aspirin EC 81 MG EC tablet Take 1 tablet (81 mg total) daily by mouth.  Marland Kitchen atorvastatin (LIPITOR) 80 MG tablet Take 1 tablet (80 mg total) daily by mouth.  . diazepam (VALIUM) 5 MG tablet Take 2.5-5 mg 3 (three) times  daily as needed by mouth for anxiety.   Marland Kitchen diltiazem (DILACOR XR) 240 MG 24 hr capsule Take 240 mg by mouth daily.  Marland Kitchen glipiZIDE (GLUCOTROL) 10 MG tablet Take 10 mg 2 (two) times daily before a meal by mouth.   Marland Kitchen HUMULIN N KWIKPEN 100 UNIT/ML Kiwkpen SMARTSIG:20 Unit(s) SUB-Q Every Morning  . hydrochlorothiazide (HYDRODIURIL) 25 MG tablet Take 25 mg daily by mouth.  . hydrOXYzine (ATARAX/VISTARIL) 10 MG tablet Take 10 mg by mouth 3 (three) times daily.  Marland Kitchen lisinopril (PRINIVIL,ZESTRIL) 40 MG tablet Take 40 mg daily by mouth.  . losartan (COZAAR) 25 MG tablet Take 25 mg by mouth daily.  . magnesium chloride (SLOW-MAG) 64 MG TBEC SR tablet Take 1 tablet (64 mg total) by mouth 2 (two) times daily.  . metFORMIN (GLUCOPHAGE) 1000 MG tablet Take 1,000 mg by mouth 2 (two) times daily.  . metoprolol succinate (TOPROL-XL) 50 MG 24 hr tablet TAKE 3 TABLETS BY MOUTH DAILY WITH OR IMMEDIATELY FOLLOWING A MEAL  . nitroGLYCERIN (NITROSTAT) 0.4 MG SL tablet Place 1 tablet (0.4 mg total) under the tongue every 5 (five) minutes as needed for chest pain.  . tamsulosin (FLOMAX) 0.4 MG CAPS capsule Take 0.4 mg by mouth daily.     Allergies:   Patient has no known allergies.   Social History   Socioeconomic History  . Marital status: Divorced    Spouse name: Not on file  . Number of children: Not on file  . Years of education: Not on  file  . Highest education level: Not on file  Occupational History  . Not on file  Tobacco Use  . Smoking status: Never Smoker  . Smokeless tobacco: Never Used  Vaping Use  . Vaping Use: Never used  Substance and Sexual Activity  . Alcohol use: Yes    Comment: weekly  . Drug use: No  . Sexual activity: Not on file  Other Topics Concern  . Not on file  Social History Narrative  . Not on file   Social Determinants of Health   Financial Resource Strain:   . Difficulty of Paying Living Expenses: Not on file  Food Insecurity:   . Worried About Programme researcher, broadcasting/film/video in  the Last Year: Not on file  . Ran Out of Food in the Last Year: Not on file  Transportation Needs:   . Lack of Transportation (Medical): Not on file  . Lack of Transportation (Non-Medical): Not on file  Physical Activity:   . Days of Exercise per Week: Not on file  . Minutes of Exercise per Session: Not on file  Stress:   . Feeling of Stress : Not on file  Social Connections:   . Frequency of Communication with Friends and Family: Not on file  . Frequency of Social Gatherings with Friends and Family: Not on file  . Attends Religious Services: Not on file  . Active Member of Clubs or Organizations: Not on file  . Attends Banker Meetings: Not on file  . Marital Status: Not on file     Family History: The patient's family history includes Hypertension in his father and mother. ROS:   Please see the history of present illness.    All 14 point review of systems negative except as described per history of present illness  EKGs/Labs/Other Studies Reviewed:      Recent Labs: 12/22/2018: Magnesium 1.6 10/28/2019: NT-Pro BNP 81 11/17/2019: BUN 16; Creatinine, Ser 1.07; Potassium 4.1; Sodium 140  Recent Lipid Panel    Component Value Date/Time   CHOL 191 02/28/2017 0017   TRIG 288 (H) 02/28/2017 0017   HDL 52 02/28/2017 0017   CHOLHDL 3.7 02/28/2017 0017   VLDL 58 (H) 02/28/2017 0017   LDLCALC 81 02/28/2017 0017    Physical Exam:    VS:  BP (!) 150/92 (BP Location: Left Arm, Patient Position: Sitting, Cuff Size: Normal)   Pulse 80   Ht 5\' 6"  (1.676 m)   Wt 244 lb (110.7 kg)   SpO2 95%   BMI 39.38 kg/m     Wt Readings from Last 3 Encounters:  12/21/19 244 lb (110.7 kg)  10/28/19 239 lb (108.4 kg)  06/17/19 244 lb (110.7 kg)     GEN:  Well nourished, well developed in no acute distress HEENT: Normal NECK: No JVD; No carotid bruits LYMPHATICS: No lymphadenopathy CARDIAC: RRR, no murmurs, no rubs, no gallops RESPIRATORY:  Clear to auscultation without  rales, wheezing or rhonchi  ABDOMEN: Soft, non-tender, non-distended MUSCULOSKELETAL:  No edema; No deformity  SKIN: Warm and dry LOWER EXTREMITIES: no swelling NEUROLOGIC:  Alert and oriented x 3 PSYCHIATRIC:  Normal affect   ASSESSMENT:    1. Supraventricular tachycardia (HCC)   2. Essential hypertension   3. Coronary artery disease involving native coronary artery of native heart without angina pectoris   4. Palpitations   5. Dyslipidemia   6. Atypical chest pain    PLAN:    In order of problems listed above:  1. Supraventricular tachycardia  doing well from that point review denies have any recent palpitations.  He supposed to wear a monitor but it never happened.  He did not take it because he did not have any palpitations.  Will wait to see how situation will evolve.  Also because of difficulty with his blood pressure control I will ask him to have another EKG done if EKG is fine then will increase dose of diltiazem from 2 40-3 60. 2. Coronary disease stable, healing of luminal disease by cardiac cath from 2018 in November. 3. Dyslipidemia: He is on high intensity statin which I will continue.  I did review K PN, latest numbers I have is from 2018 and November which is HDL 52 LDL 81. 4. Atypical chest pain denies having any now. 5. Concern for today's his blood pressure being still not controlled.  We will try to increase calcium channel blocker to see if it helps.  He may require the addition of medication from different class.  It may be Aldactone will be beneficial.   Medication Adjustments/Labs and Tests Ordered: Current medicines are reviewed at length with the patient today.  Concerns regarding medicines are outlined above.  No orders of the defined types were placed in this encounter.  Medication changes: No orders of the defined types were placed in this encounter.   Signed, Georgeanna Lea, MD, [facility] 12/21/2019 2:36 PM    [facility]

## 2019-12-21 NOTE — Patient Instructions (Signed)
Medication Instructions:  Your physician has recommended you make the following change in your medication:  INCREASE:  Diltiazem 360 mg daily   *If you need a refill on your cardiac medications before your next appointment, please call your pharmacy*   Lab Work: None.  If you have labs (blood work) drawn today and your tests are completely normal, you will receive your results only by: Marland Kitchen MyChart Message (if you have MyChart) OR . A paper copy in the mail If you have any lab test that is abnormal or we need to change your treatment, we will call you to review the results.   Testing/Procedures: None.    Follow-Up: At [facility], you and your health needs are our priority.  As part of our continuing mission to provide you with exceptional heart care, we have created designated Provider Care Teams.  These Care Teams include your primary Cardiologist (physician) and Advanced Practice Providers (APPs -  Physician Assistants and Nurse Practitioners) who all work together to provide you with the care you need, when you need it.  We recommend signing up for the patient portal called "MyChart".  Sign up information is provided on this After Visit Summary.  MyChart is used to connect with patients for Virtual Visits (Telemedicine).  Patients are able to view lab/test results, encounter notes, upcoming appointments, etc.  Non-urgent messages can be sent to your provider as well.   To learn more about what you can do with MyChart, go to [email or web address].    Your next appointment:   5 month(s)  The format for your next appointment:   In Person  Provider:   Gypsy Balsam, MD   Other Instructions

## 2019-12-21 NOTE — Addendum Note (Signed)
Addended by: Vanessa Osterdock R on: 12/21/2019 03:00 PM   Modules accepted: Orders

## 2020-03-23 LAB — LAB REPORT - SCANNED
A1c: 8.4
EGFR: 67

## 2020-04-12 ENCOUNTER — Other Ambulatory Visit: Payer: Self-pay | Admitting: Cardiology

## 2020-05-03 ENCOUNTER — Telehealth: Payer: Self-pay | Admitting: Cardiology

## 2020-05-03 NOTE — Telephone Encounter (Signed)
Eunice Blase is calling requesting Tyler Ellis's EKG results from 12/21/19. Their fax # is: [phone]. She is requesting it states "ATTN: DEBBIE". Please advise.

## 2020-05-03 NOTE — Telephone Encounter (Signed)
EKG results faxed.

## 2020-05-05 NOTE — Telephone Encounter (Signed)
Tyler Ellis calling back. She states the EKG they received does not have a date on it. She states they need it sent with a date the EKG was done.

## 2020-05-08 LAB — LAB REPORT - SCANNED: EGFR: 81

## 2020-05-11 DIAGNOSIS — N401 Enlarged prostate with lower urinary tract symptoms: Secondary | ICD-10-CM | POA: Insufficient documentation

## 2020-05-11 DIAGNOSIS — N138 Other obstructive and reflux uropathy: Secondary | ICD-10-CM

## 2020-05-11 HISTORY — DX: Other obstructive and reflux uropathy: N13.8

## 2020-05-11 HISTORY — DX: Benign prostatic hyperplasia with lower urinary tract symptoms: N40.1

## 2020-05-12 NOTE — Telephone Encounter (Signed)
Refax EKG results

## 2020-05-13 ENCOUNTER — Telehealth: Payer: Self-pay

## 2020-05-13 ENCOUNTER — Telehealth: Payer: Self-pay | Admitting: Cardiology

## 2020-05-13 NOTE — Telephone Encounter (Signed)
Patient states he was supposed to be started on eliquis but he couldn't afford it. He was in hospital when it was supposed to be ordered for patient. Will check with Dr. Bing Matter if she still wants patient to start on this.

## 2020-05-13 NOTE — Telephone Encounter (Signed)
After looking in Care Everywhere this pt was seen at [facility] and was started on Eliquis.

## 2020-05-13 NOTE — Telephone Encounter (Signed)
New message:     Patient calling stating that the doctor sent prescription and patient states that is 600.00 patient do not know the name of the medication. Pease advise.

## 2020-05-13 NOTE — Telephone Encounter (Signed)
Left a message to return my call.

## 2020-05-13 NOTE — Telephone Encounter (Signed)
Left message for pt to call back  °

## 2020-05-16 NOTE — Telephone Encounter (Signed)
Called patient. Informed him of upcoming appointment and that a decision would be made then. He verbally understood.

## 2020-05-16 NOTE — Telephone Encounter (Signed)
Last week I spoke to Dr. Reuel Boom about him please make arrangements for him to follow-up with me and then will decide

## 2020-05-24 ENCOUNTER — Ambulatory Visit: Payer: Medicare Other | Admitting: Cardiology

## 2020-05-24 ENCOUNTER — Other Ambulatory Visit: Payer: Self-pay

## 2020-05-24 ENCOUNTER — Ambulatory Visit (INDEPENDENT_AMBULATORY_CARE_PROVIDER_SITE_OTHER): Payer: Medicare Other

## 2020-05-24 VITALS — BP 156/84 | HR 107 | Ht 67.0 in | Wt 246.0 lb

## 2020-05-24 DIAGNOSIS — I471 Supraventricular tachycardia, unspecified: Secondary | ICD-10-CM

## 2020-05-24 DIAGNOSIS — R002 Palpitations: Secondary | ICD-10-CM

## 2020-05-24 DIAGNOSIS — I480 Paroxysmal atrial fibrillation: Secondary | ICD-10-CM

## 2020-05-24 DIAGNOSIS — I2510 Atherosclerotic heart disease of native coronary artery without angina pectoris: Secondary | ICD-10-CM

## 2020-05-24 DIAGNOSIS — E118 Type 2 diabetes mellitus with unspecified complications: Secondary | ICD-10-CM

## 2020-05-24 DIAGNOSIS — I10 Essential (primary) hypertension: Secondary | ICD-10-CM

## 2020-05-24 DIAGNOSIS — E785 Hyperlipidemia, unspecified: Secondary | ICD-10-CM

## 2020-05-24 HISTORY — DX: Paroxysmal atrial fibrillation: I48.0

## 2020-05-24 NOTE — Patient Instructions (Signed)
Medication Instructions:  Your physician recommends that you continue on your current medications as directed. Please refer to the Current Medication list given to you today.   *If you need a refill on your cardiac medications before your next appointment, please call your pharmacy*   Lab Work: None If you have labs (blood work) drawn today and your tests are completely normal, you will receive your results only by: . MyChart Message (if you have MyChart) OR . A paper copy in the mail If you have any lab test that is abnormal or we need to change your treatment, we will call you to review the results.   Testing/Procedures: Your physician has requested that you have an echocardiogram. Echocardiography is a painless test that uses sound waves to create images of your heart. It provides your doctor with information about the size and shape of your heart and how well your heart's chambers and valves are working. This procedure takes approximately one hour. There are no restrictions for this procedure.  A zio monitor was ordered today. It will remain on for 7 days. You will then return monitor and event diary in provided box. It takes 1-2 weeks for report to be downloaded and returned to us. We will call you with the results. If monitor falls off or has orange flashing light, please call Zio for further instructions.      Follow-Up: At [facility], you and your health needs are our priority.  As part of our continuing mission to provide you with exceptional heart care, we have created designated Provider Care Teams.  These Care Teams include your primary Cardiologist (physician) and Advanced Practice Providers (APPs -  Physician Assistants and Nurse Practitioners) who all work together to provide you with the care you need, when you need it.  We recommend signing up for the patient portal called "MyChart".  Sign up information is provided on this After Visit Summary.  MyChart is used to  connect with patients for Virtual Visits (Telemedicine).  Patients are able to view lab/test results, encounter notes, upcoming appointments, etc.  Non-urgent messages can be sent to your provider as well.   To learn more about what you can do with MyChart, go to [email or web address].com.    Your next appointment:   6 week(s)  The format for your next appointment:   In Person  Provider:   Robert Krasowski, MD   Other Instructions   Echocardiogram An echocardiogram is a test that uses sound waves (ultrasound) to produce images of the heart. Images from an echocardiogram can provide important information about:  Heart size and shape.  The size and thickness and movement of your heart's walls.  Heart muscle function and strength.  Heart valve function or if you have stenosis. Stenosis is when the heart valves are too narrow.  If blood is flowing backward through the heart valves (regurgitation).  A tumor or infectious growth around the heart valves.  Areas of heart muscle that are not working well because of poor blood flow or injury from a heart attack.  Aneurysm detection. An aneurysm is a weak or damaged part of an artery wall. The wall bulges out from the normal force of blood pumping through the body. Tell a health care provider about:  Any allergies you have.  All medicines you are taking, including vitamins, herbs, eye drops, creams, and over-the-counter medicines.  Any blood disorders you have.  Any surgeries you have had.  Any medical conditions you have.  Whether   you are pregnant or may be pregnant. What are the risks? Generally, this is a safe test. However, problems may occur, including an allergic reaction to dye (contrast) that may be used during the test. What happens before the test? No specific preparation is needed. You may eat and drink normally. What happens during the test?  You will take off your clothes from the waist up and put on a hospital  gown.  Electrodes or electrocardiogram (ECG)patches may be placed on your chest. The electrodes or patches are then connected to a device that monitors your heart rate and rhythm.  You will lie down on a table for an ultrasound exam. A gel will be applied to your chest to help sound waves pass through your skin.  A handheld device, called a transducer, will be pressed against your chest and moved over your heart. The transducer produces sound waves that travel to your heart and bounce back (or "echo" back) to the transducer. These sound waves will be captured in real-time and changed into images of your heart that can be viewed on a video monitor. The images will be recorded on a computer and reviewed by your health care provider.  You may be asked to change positions or hold your breath for a short time. This makes it easier to get different views or better views of your heart.  In some cases, you may receive contrast through an IV in one of your veins. This can improve the quality of the pictures from your heart. The procedure may vary among health care providers and hospitals.   What can I expect after the test? You may return to your normal, everyday life, including diet, activities, and medicines, unless your health care provider tells you not to do that. Follow these instructions at home:  It is up to you to get the results of your test. Ask your health care provider, or the department that is doing the test, when your results will be ready.  Keep all follow-up visits. This is important. Summary  An echocardiogram is a test that uses sound waves (ultrasound) to produce images of the heart.  Images from an echocardiogram can provide important information about the size and shape of your heart, heart muscle function, heart valve function, and other possible heart problems.  You do not need to do anything to prepare before this test. You may eat and drink normally.  After the  echocardiogram is completed, you may return to your normal, everyday life, unless your health care provider tells you not to do that. This information is not intended to replace advice given to you by your health care provider. Make sure you discuss any questions you have with your health care provider. Document Revised: 12/01/2019 Document Reviewed: 12/01/2019 Elsevier Patient Education  2021 [employer]   

## 2020-05-24 NOTE — Progress Notes (Unsigned)
Cardiology Office Note:    Date:  05/24/2020   ID:  Tyler Ellis, DOB [birth year], MRN [id number]  PCP:  Forrest Moron, MD  Cardiologist:  Gypsy Balsam, MD    Referring MD: Forrest Moron, MD   Chief Complaint  Patient presents with  . Follow-up  I am doing fine  History of Present Illness:    Tyler Ellis is a 70 y.o. male with past medical history significant for supraventricular tachycardia successfully suppressed with calcium channel blocker, also essential hypertension, diabetes, coronary artery disease with cardiac catheterization done in November 2019 showing up to 10% blockages.  Recently he went to have urological procedure.  When he was getting ready for surgery he became tachycardic and anesthesiologist documented sinus rhythm/sinus tachycardia with some extrasystole.  He was seen by cardiologist Dr. Jeralene Huff who actually called me when he seen him and told me that he is having episode of atrial fibrillation and the plan was to initiate anticoagulation.  He was given prescription for Eliquis however could not afford it and he does not want to take it now.  He comes today to my office to talk about this.  He described to have some palpitations on and off but goes up to the short episodes it happens typically when she gets upset or nervous.  Denies have any chest pain tightness squeezing pressure burning chest no dizziness no passing out  Past Medical History:  Diagnosis Date  . Atypical chest pain 10/28/2019  . Coronary artery disease   . Diabetes mellitus (HCC) 02/02/2014  . Diabetes mellitus without complication (HCC)   . Dyslipidemia 03/11/2017  . 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 Enlarged prostate with urinary obstruction 05/11/2020  . Essential hypertension 03/11/2017  . Hypertension   . Hyperthyroidism 06/26/2017  . Multinodular goiter 07/03/2017  . Nocturia 02/02/2014  . NSTEMI (non-ST elevated myocardial infarction) (HCC) 02/27/2017  .  Palpitations 10/28/2019  . Supraventricular tachycardia (HCC) 02/14/2018  . Type 2 diabetes mellitus with complication, without long-term current use of insulin (HCC) 03/11/2017    Past Surgical History:  Procedure Laterality Date  . KNEE SURGERY    . LEFT HEART CATH AND CORONARY ANGIOGRAPHY N/A 02/28/2017   Procedure: LEFT HEART CATH AND CORONARY ANGIOGRAPHY;  Surgeon: Kathleene Hazel, MD;  [facility]: [facility];  Service: Cardiovascular;  Laterality: N/A;    Current Medications: Current Meds  Medication Sig  . acetaminophen-codeine (TYLENOL #3) 300-30 MG tablet Take 1 tablet every 8 (eight) hours as needed by mouth for moderate pain.   Marland Kitchen ALPRAZolam (XANAX) 0.5 MG tablet Take 0.25-0.5 mg by mouth 3 (three) times daily as needed.  Marland Kitchen aspirin EC 81 MG EC tablet Take 1 tablet (81 mg total) daily by mouth.  Marland Kitchen atorvastatin (LIPITOR) 80 MG tablet Take 1 tablet (80 mg total) daily by mouth.  . diazepam (VALIUM) 5 MG tablet Take 2.5-5 mg 3 (three) times daily as needed by mouth for anxiety.   Marland Kitchen diltiazem (CARDIZEM CD) 360 MG 24 hr capsule Take 1 capsule (360 mg total) by mouth daily.  Marland Kitchen glipiZIDE (GLUCOTROL) 10 MG tablet Take 10 mg 2 (two) times daily before a meal by mouth.   Marland Kitchen HUMULIN N KWIKPEN 100 UNIT/ML Kiwkpen SMARTSIG:20 Unit(s) SUB-Q Every Morning  . hydrochlorothiazide (HYDRODIURIL) 25 MG tablet Take 25 mg daily by mouth.  . hydrOXYzine (ATARAX/VISTARIL) 10 MG tablet Take 10 mg by mouth in the morning and at bedtime.  Marland Kitchen lisinopril (PRINIVIL,ZESTRIL) 40 MG tablet  Take 40 mg daily by mouth.  . losartan (COZAAR) 25 MG tablet Take 25 mg by mouth daily.  . magnesium chloride (SLOW-MAG) 64 MG TBEC SR tablet Take 1 tablet (64 mg total) by mouth 2 (two) times daily.  . metFORMIN (GLUCOPHAGE) 1000 MG tablet Take 1,000 mg by mouth 2 (two) times daily.  . metoprolol succinate (TOPROL-XL) 50 MG 24 hr tablet TAKE 3 TABLETS BY MOUTH DAILY WITH OR IMMEDIATELY FOLLOWING A MEAL  .  nitroGLYCERIN (NITROSTAT) 0.4 MG SL tablet Place 1 tablet (0.4 mg total) under the tongue every 5 (five) minutes as needed for chest pain.  . tamsulosin (FLOMAX) 0.4 MG CAPS capsule Take 0.4 mg by mouth daily.     Allergies:   Patient has no known allergies.   Social History   Socioeconomic History  . Marital status: Divorced    Spouse name: Not on file  . Number of children: Not on file  . Years of education: Not on file  . Highest education level: Not on file  Occupational History  . Not on file  Tobacco Use  . Smoking status: Never Smoker  . Smokeless tobacco: Never Used  Vaping Use  . Vaping Use: Never used  Substance and Sexual Activity  . Alcohol use: Yes    Comment: weekly  . Drug use: No  . Sexual activity: Not on file  Other Topics Concern  . Not on file  Social History Narrative  . Not on file   Social Determinants of Health   Financial Resource Strain: Not on file  Food Insecurity: Not on file  Transportation Needs: Not on file  Physical Activity: Not on file  Stress: Not on file  Social Connections: Not on file     Family History: The patient's family history includes Hypertension in his father and mother. ROS:   Please see the history of present illness.    All 14 point review of systems negative except as described per history of present illness  EKGs/Labs/Other Studies Reviewed:      Recent Labs: 10/28/2019: NT-Pro BNP 81 11/17/2019: BUN 16; Creatinine, Ser 1.07; Potassium 4.1; Sodium 140  Recent Lipid Panel    Component Value Date/Time   CHOL 191 02/28/2017 0017   TRIG 288 (H) 02/28/2017 0017   HDL 52 02/28/2017 0017   CHOLHDL 3.7 02/28/2017 0017   VLDL 58 (H) 02/28/2017 0017   LDLCALC 81 02/28/2017 0017    Physical Exam:    VS:  BP (!) 156/84 (BP Location: Right Arm, Patient Position: Sitting)   Pulse (!) 107   Ht 5\' 7"  (1.702 m)   Wt 246 lb (111.6 kg)   SpO2 96%   BMI 38.53 kg/m     Wt Readings from Last 3 Encounters:   05/24/20 246 lb (111.6 kg)  12/21/19 244 lb (110.7 kg)  10/28/19 239 lb (108.4 kg)     GEN:  Well nourished, well developed in no acute distress HEENT: Normal NECK: No JVD; No carotid bruits LYMPHATICS: No lymphadenopathy CARDIAC: RRR, no murmurs, no rubs, no gallops RESPIRATORY:  Clear to auscultation without rales, wheezing or rhonchi  ABDOMEN: Soft, non-tender, non-distended MUSCULOSKELETAL:  No edema; No deformity  SKIN: Warm and dry LOWER EXTREMITIES: no swelling NEUROLOGIC:  Alert and oriented x 3 PSYCHIATRIC:  Normal affect   ASSESSMENT:    1. Supraventricular tachycardia (HCC)   2. Paroxysmal atrial fibrillation (HCC)   3. Essential hypertension   4. Coronary artery disease involving native coronary artery of native  heart without angina pectoris   5. Type 2 diabetes mellitus with complication, without long-term current use of insulin (HCC)   6. Dyslipidemia    PLAN:    In order of problems listed above:  1. Paroxysmal atrial fibrillation.  I see no documentation of it there is no EKG however did talk to Dr. Jeralene Huff at the time he was in the hospital he told me that he was in atrial fibrillation.  I wanted him to be anticoagulated because of chads 2 Vascor equals 3.  However patient told me he does not want to do it right now because it simply too expensive.  We had a long discussion about this and we reached conclusion that we will put another monitor on him to see how frequent those episodes are however it does not really change much approach terms of anticoagulation, but I hope I will be able to convince him to take his medications.  He also will have some documentation of atrial fibrillation.  I will also ask him to have an echocardiogram to assess left ventricle ejection fraction more importantly look at the size of the left atrium. 2. Essential hypertension blood pressure elevated today but he is upset about this entire scenario.  We will continue  monitoring. 3. Diabetes followed by internal medicine team, I do not latest hemoglobin A1c. 4. Dyslipidemia he is on statin which I will continue.  Lipitor 80.  Overall is quite complicated situation we will try to solve this by putting another monitor on as well as checking echocardiogram.   Medication Adjustments/Labs and Tests Ordered: Current medicines are reviewed at length with the patient today.  Concerns regarding medicines are outlined above.  No orders of the defined types were placed in this encounter.  Medication changes: No orders of the defined types were placed in this encounter.   Signed, Georgeanna Lea, MD, [facility] 05/24/2020 2:04 PM    [facility]

## 2020-06-03 ENCOUNTER — Telehealth: Payer: Self-pay | Admitting: Emergency Medicine

## 2020-06-03 DIAGNOSIS — I471 Supraventricular tachycardia: Secondary | ICD-10-CM

## 2020-06-03 DIAGNOSIS — E119 Type 2 diabetes mellitus without complications: Secondary | ICD-10-CM | POA: Insufficient documentation

## 2020-06-03 MED ORDER — FLECAINIDE ACETATE 50 MG PO TABS: 50.0000 mg | ORAL_TABLET | Freq: Two times a day (BID) | ORAL | 1 refills | Status: DC

## 2020-06-03 NOTE — Telephone Encounter (Signed)
Patient called back. Informed him to start flecainide 50 mg twice daily. [place] will go [place] Monday for ekg and labs. He was added to Dr. Mallory Shirk schedule. No further questions.

## 2020-06-03 NOTE — Telephone Encounter (Signed)
Per Dr. Tomie China patient advised we will call back about him starting flecainide. First I have to check with pharmacists about drug interactions. Pharmacists at patient pharmacy is on lunch now will call back after 2pm.

## 2020-06-03 NOTE — Addendum Note (Signed)
Addended by: Hazle Quant on: 06/03/2020 02:43 PM   Modules accepted: Orders

## 2020-06-03 NOTE — Telephone Encounter (Signed)
Please have the patient start flecainide 50 mg twice daily.  Every 12 hours.  Let the patient come for an EKG and a Chem-7 on Monday at the [facility] office.

## 2020-06-03 NOTE — Telephone Encounter (Signed)
Called and spoke to pharmacist at patients [facility] pharmacy. She advised that flecainide 50 mg twice daily would be fine to start along with current medications. She did state he takes a high dose of metoprolol and the system does flag diltiazem and metoprolol together however he has been taking both already. Will inform Dr. Tomie China.

## 2020-06-03 NOTE — Telephone Encounter (Signed)
Left message for patient to return call.

## 2020-06-03 NOTE — Telephone Encounter (Signed)
I will need to see the strips ASAP.!!!!

## 2020-06-03 NOTE — Telephone Encounter (Signed)
[employer] called with a critical zio report. The patient wore the monitor for 6 days. Abnormal SVT noted 5 episodes. Strip 5 had a heart rate of 189 for 1 minute. Will consult with Dr. Tomie China since Dr. Bing Matter is off.

## 2020-06-03 NOTE — Addendum Note (Signed)
Addended by: Hazle Quant on: 06/03/2020 02:55 PM   Modules accepted: Orders

## 2020-06-06 ENCOUNTER — Telehealth: Payer: Self-pay | Admitting: Emergency Medicine

## 2020-06-06 ENCOUNTER — Telehealth: Payer: Self-pay | Admitting: Cardiology

## 2020-06-06 ENCOUNTER — Ambulatory Visit: Payer: Medicare Other | Admitting: Cardiology

## 2020-06-06 ENCOUNTER — Other Ambulatory Visit: Payer: Self-pay

## 2020-06-06 ENCOUNTER — Encounter: Payer: Self-pay | Admitting: Cardiology

## 2020-06-06 VITALS — BP 160/88 | HR 94 | Ht 67.0 in | Wt 247.0 lb

## 2020-06-06 DIAGNOSIS — I480 Paroxysmal atrial fibrillation: Secondary | ICD-10-CM | POA: Diagnosis not present

## 2020-06-06 DIAGNOSIS — I471 Supraventricular tachycardia: Secondary | ICD-10-CM

## 2020-06-06 DIAGNOSIS — I10 Essential (primary) hypertension: Secondary | ICD-10-CM | POA: Diagnosis not present

## 2020-06-06 DIAGNOSIS — Z79899 Other long term (current) drug therapy: Secondary | ICD-10-CM

## 2020-06-06 HISTORY — DX: Other long term (current) drug therapy: Z79.899

## 2020-06-06 NOTE — Telephone Encounter (Signed)
Laura is calling stating the medication Dr. Servando Salina advised him to increase today at his appointment is not listed on his AVS so he is unsure of which medication it is. Please advise.

## 2020-06-06 NOTE — Telephone Encounter (Signed)
Patient informed of results and referral placed.  

## 2020-06-06 NOTE — Progress Notes (Signed)
Cardiology Office Note:    Date:  06/06/2020   ID:  Tyler Ellis, DOB [birth year], MRN [id number]  PCP:  Forrest Moron, MD  Cardiologist:  Gypsy Balsam, MD  Electrophysiologist:  None   Referring MD: Forrest Moron, MD   I am doing fine  History of Present Illness:    Tyler Ellis is a 70 y.o. male with a hx of supraventricular tachycardia successfully suppressed with calcium channel blocker, also essential hypertension, diabetes, coronary artery disease with cardiac catheterization done in November 2019 showing up to 10% blockages, paroxysmal atrial fibrillation however the patient is not on anticoagulation as he reported to his primary cardiologist that this medication is too expensive he rather not be on it.  He had a monitor which showed that the patient had some atrial arrhythmia he was started on flecainide 50 mg twice daily by his primary cardiologist.  He is here today for a follow-up EKG appointment.  He offers no complaints at this time.  Past Medical History:  Diagnosis Date  . Atypical chest pain 10/28/2019  . Coronary artery disease   . Diabetes mellitus (HCC) 02/02/2014  . Diabetes mellitus without complication (HCC)   . Dyslipidemia 03/11/2017  . 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 Enlarged prostate with urinary obstruction 05/11/2020  . Essential hypertension 03/11/2017  . Hypertension   . Hyperthyroidism 06/26/2017  . Multinodular goiter 07/03/2017  . Nocturia 02/02/2014  . NSTEMI (non-ST elevated myocardial infarction) (HCC) 02/27/2017  . Palpitations 10/28/2019  . Supraventricular tachycardia (HCC) 02/14/2018  . Type 2 diabetes mellitus with complication, without long-term current use of insulin (HCC) 03/11/2017    Past Surgical History:  Procedure Laterality Date  . KNEE SURGERY    . LEFT HEART CATH AND CORONARY ANGIOGRAPHY N/A 02/28/2017   Procedure: LEFT HEART CATH AND CORONARY ANGIOGRAPHY;  Surgeon: Kathleene Hazel, MD;   Location: [facility];  Service: Cardiovascular;  Laterality: N/A;    Current Medications: Current Meds  Medication Sig  . acetaminophen-codeine (TYLENOL #3) 300-30 MG tablet Take 1 tablet every 8 (eight) hours as needed by mouth for moderate pain.   Marland Kitchen ALPRAZolam (XANAX) 0.5 MG tablet Take 0.25-0.5 mg by mouth 3 (three) times daily as needed.  Marland Kitchen aspirin EC 81 MG EC tablet Take 1 tablet (81 mg total) daily by mouth.  Marland Kitchen atorvastatin (LIPITOR) 80 MG tablet Take 1 tablet (80 mg total) daily by mouth.  . diazepam (VALIUM) 5 MG tablet Take 2.5-5 mg 3 (three) times daily as needed by mouth for anxiety.   . flecainide (TAMBOCOR) 50 MG tablet Take 1 tablet (50 mg total) by mouth 2 (two) times daily. Every 12 hours  . glipiZIDE (GLUCOTROL) 10 MG tablet Take 10 mg 2 (two) times daily before a meal by mouth.   Marland Kitchen HUMULIN N KWIKPEN 100 UNIT/ML Kiwkpen SMARTSIG:20 Unit(s) SUB-Q Every Morning  . hydrochlorothiazide (HYDRODIURIL) 25 MG tablet Take 25 mg daily by mouth.  . hydrOXYzine (ATARAX/VISTARIL) 10 MG tablet Take 10 mg by mouth in the morning and at bedtime.  Marland Kitchen losartan (COZAAR) 25 MG tablet Take 25 mg by mouth daily.  . magnesium chloride (SLOW-MAG) 64 MG TBEC SR tablet Take 1 tablet (64 mg total) by mouth 2 (two) times daily.  . metFORMIN (GLUCOPHAGE) 1000 MG tablet Take 1,000 mg by mouth 2 (two) times daily.  . metoprolol succinate (TOPROL-XL) 50 MG 24 hr tablet TAKE 3 TABLETS BY MOUTH DAILY WITH OR IMMEDIATELY FOLLOWING A MEAL  . tamsulosin (  FLOMAX) 0.4 MG CAPS capsule Take 0.4 mg by mouth daily.  . [DISCONTINUED] lisinopril (PRINIVIL,ZESTRIL) 40 MG tablet Take 40 mg daily by mouth.     Allergies:   Patient has no known allergies.   Social History   Socioeconomic History  . Marital status: Divorced    Spouse name: Not on file  . Number of children: Not on file  . Years of education: Not on file  . Highest education level: Not on file  Occupational History  . Not on file  Tobacco  Use  . Smoking status: Never Smoker  . Smokeless tobacco: Never Used  Vaping Use  . Vaping Use: Never used  Substance and Sexual Activity  . Alcohol use: Yes    Comment: weekly  . Drug use: No  . Sexual activity: Not on file  Other Topics Concern  . Not on file  Social History Narrative  . Not on file   Social Determinants of Health   Financial Resource Strain: Not on file  Food Insecurity: Not on file  Transportation Needs: Not on file  Physical Activity: Not on file  Stress: Not on file  Social Connections: Not on file     Family History: The patient's family history includes Hypertension in his father and mother.  ROS:   Review of Systems  Constitution: Negative for decreased appetite, fever and weight gain.  HENT: Negative for congestion, ear discharge, hoarse voice and sore throat.   Eyes: Negative for discharge, redness, vision loss in right eye and visual halos.  Cardiovascular: Negative for chest pain, dyspnea on exertion, leg swelling, orthopnea and palpitations.  Respiratory: Negative for cough, hemoptysis, shortness of breath and snoring.   Endocrine: Negative for heat intolerance and polyphagia.  Hematologic/Lymphatic: Negative for bleeding problem. Does not bruise/bleed easily.  Skin: Negative for flushing, nail changes, rash and suspicious lesions.  Musculoskeletal: Negative for arthritis, joint pain, muscle cramps, myalgias, neck pain and stiffness.  Gastrointestinal: Negative for abdominal pain, bowel incontinence, diarrhea and excessive appetite.  Genitourinary: Negative for decreased libido, genital sores and incomplete emptying.  Neurological: Negative for brief paralysis, focal weakness, headaches and loss of balance.  Psychiatric/Behavioral: Negative for altered mental status, depression and suicidal ideas.  Allergic/Immunologic: Negative for HIV exposure and persistent infections.    EKGs/Labs/Other Studies Reviewed:    The following studies were  reviewed today:   EKG:  The ekg ordered today demonstrates sinus rhythm, heart rate 94 bpm with QTC 447 ms.  Recent Labs: 10/28/2019: NT-Pro BNP 81 11/17/2019: BUN 16; Creatinine, Ser 1.07; Potassium 4.1; Sodium 140  Recent Lipid Panel    Component Value Date/Time   CHOL 191 02/28/2017 0017   TRIG 288 (H) 02/28/2017 0017   HDL 52 02/28/2017 0017   CHOLHDL 3.7 02/28/2017 0017   VLDL 58 (H) 02/28/2017 0017   LDLCALC 81 02/28/2017 0017    Physical Exam:    VS:  BP (!) 160/88   Pulse 94   Ht 5\' 7"  (1.702 m)   Wt 247 lb (112 kg)   SpO2 96%   BMI 38.69 kg/m     Wt Readings from Last 3 Encounters:  06/06/20 247 lb (112 kg)  05/24/20 246 lb (111.6 kg)  12/21/19 244 lb (110.7 kg)     GEN: Well nourished, well developed in no acute distress HEENT: Normal NECK: No JVD; No carotid bruits LYMPHATICS: No lymphadenopathy CARDIAC: S1S2 noted,RRR, no murmurs, rubs, gallops RESPIRATORY:  Clear to auscultation without rales, wheezing or rhonchi  ABDOMEN:  Soft, non-tender, non-distended, +bowel sounds, no guarding. EXTREMITIES: No edema, No cyanosis, no clubbing MUSCULOSKELETAL:  No deformity  SKIN: Warm and dry NEUROLOGIC:  Alert and oriented x 3, non-focal PSYCHIATRIC:  Normal affect, good insight  ASSESSMENT:    1. Secondary hypertension   2. Medication management    PLAN:     He is in sinus rhythm today he is taking his flecainide.  He still is not on anticoagulation and has declined. His blood pressure is elevated today in the office he is on losartan 20 mg daily it appears that this is increasing gradually giving the fact that the patient also has diabetes I have advised him that he should at least be less than 130/80 meters mercury.  We will come to increase his losartan from 25 mg daily to 25 mg twice a day.  He will follow with Dr. Bing MatterKrasowski in 4 weeks.  The patient understands the need to lose weight with diet and exercise. We have discussed specific strategies for  this.  The patient is in agreement with the above plan. The patient left the office in stable condition.  The patient will follow up in   Medication Adjustments/Labs and Tests Ordered: Current medicines are reviewed at length with the patient today.  Concerns regarding medicines are outlined above.  Orders Placed This Encounter  Procedures  . EKG 12-Lead   No orders of the defined types were placed in this encounter.   Patient Instructions  Medication Instructions:  Your physician recommends that you continue on your current medications as directed. Please refer to the Current Medication list given to you today.  *If you need a refill on your cardiac medications before your next appointment, please call your pharmacy*   Lab Work: NONE If you have labs (blood work) drawn today and your tests are completely normal, you will receive your results only by: Marland Kitchen. MyChart Message (if you have MyChart) OR . A paper copy in the mail If you have any lab test that is abnormal or we need to change your treatment, we will call you to review the results.   Testing/Procedures: NONE   Follow-Up: At [facility], you and your health needs are our priority.  As part of our continuing mission to provide you with exceptional heart care, we have created designated Provider Care Teams.  These Care Teams include your primary Cardiologist (physician) and Advanced Practice Providers (APPs -  Physician Assistants and Nurse Practitioners) who all work together to provide you with the care you need, when you need it.  We recommend signing up for the patient portal called "MyChart".  Sign up information is provided on this After Visit Summary.  MyChart is used to connect with patients for Virtual Visits (Telemedicine).  Patients are able to view lab/test results, encounter notes, upcoming appointments, etc.  Non-urgent messages can be sent to your provider as well.   To learn more about what you can do with  MyChart, go to [email or web address].    Your next appointment:   4 week(s)  The format for your next appointment:   In Person  Provider:   Gypsy Balsamobert Krasowski, MD   Other Instructions      Adopting a Healthy Lifestyle.  Know what a healthy weight is for you (roughly BMI <25) and aim to maintain this   Aim for 7+ servings of fruits and vegetables daily   65-80+ fluid ounces of water or unsweet tea for healthy kidneys   Limit to max 1  drink of alcohol per day; avoid smoking/tobacco   Limit animal fats in diet for cholesterol and heart health - choose grass fed whenever available   Avoid highly processed foods, and foods high in saturated/trans fats   Aim for low stress - take time to unwind and care for your mental health   Aim for 150 min of moderate intensity exercise weekly for heart health, and weights twice weekly for bone health   Aim for 7-9 hours of sleep daily   When it comes to diets, agreement about the perfect plan isnt easy to find, even among the experts. Experts at the [facility] [employer] developed an idea known as the Healthy Eating Plate. Just imagine a plate divided into logical, healthy portions.   The emphasis is on diet quality:   Load up on vegetables and fruits - one-half of your plate: Aim for color and variety, and remember that potatoes dont count.   Go for whole grains - one-quarter of your plate: Whole wheat, barley, wheat berries, quinoa, oats, brown rice, and foods made with them. If you want pasta, go with whole wheat pasta.   Protein power - one-quarter of your plate: Fish, chicken, beans, and nuts are all healthy, versatile protein sources. Limit red meat.   The diet, however, does go beyond the plate, offering a few other suggestions.   Use healthy plant oils, such as olive, canola, soy, corn, sunflower and peanut. Check the labels, and avoid partially hydrogenated oil, which have unhealthy trans fats.   If youre  thirsty, drink water. Coffee and tea are good in moderation, but skip sugary drinks and limit milk and dairy products to one or two daily servings.   The type of carbohydrate in the diet is more important than the amount. Some sources of carbohydrates, such as vegetables, fruits, whole grains, and beans-are healthier than others.   Finally, stay active  Signed, Thomasene Ripple, DO  06/06/2020 3:42 PM    [facility]

## 2020-06-06 NOTE — Patient Instructions (Addendum)
Medication Instructions:  Your physician recommends that you continue on your current medications as directed. Please refer to the Current Medication list given to you today.  *If you need a refill on your cardiac medications before your next appointment, please call your pharmacy*   Lab Work: NONE If you have labs (blood work) drawn today and your tests are completely normal, you will receive your results only by: Marland Kitchen MyChart Message (if you have MyChart) OR . A paper copy in the mail If you have any lab test that is abnormal or we need to change your treatment, we will call you to review the results.   Testing/Procedures: NONE   Follow-Up: At [facility], you and your health needs are our priority.  As part of our continuing mission to provide you with exceptional heart care, we have created designated Provider Care Teams.  These Care Teams include your primary Cardiologist (physician) and Advanced Practice Providers (APPs -  Physician Assistants and Nurse Practitioners) who all work together to provide you with the care you need, when you need it.  We recommend signing up for the patient portal called "MyChart".  Sign up information is provided on this After Visit Summary.  MyChart is used to connect with patients for Virtual Visits (Telemedicine).  Patients are able to view lab/test results, encounter notes, upcoming appointments, etc.  Non-urgent messages can be sent to your provider as well.   To learn more about what you can do with MyChart, go to [email or web address].    Your next appointment:   4 week(s)  The format for your next appointment:   In Person  Provider:   Gypsy Balsam, MD   Other Instructions

## 2020-06-06 NOTE — Telephone Encounter (Signed)
-----   Message from Georgeanna Lea, MD sent at 06/06/2020 10:13 AM EST ----- In spite of quite aggressive medical therapy he still have breakthrough arrhythmia look like he still gets some SVT.  Please refer him to EP for consideration of ablation

## 2020-06-07 ENCOUNTER — Other Ambulatory Visit: Payer: Self-pay

## 2020-06-07 ENCOUNTER — Telehealth: Payer: Self-pay

## 2020-06-07 MED ORDER — LOSARTAN POTASSIUM 25 MG PO TABS: 25.0000 mg | ORAL_TABLET | Freq: Two times a day (BID) | ORAL | 3 refills | Status: DC

## 2020-06-07 NOTE — Progress Notes (Signed)
Prescription sent to pharmacy due to the increase in frequency of taking.

## 2020-06-07 NOTE — Telephone Encounter (Signed)
Called to discuss the medication increase, no answer at this time. Left message for patient to return our call.

## 2020-06-07 NOTE — Telephone Encounter (Signed)
Left message for patient to return our call.

## 2020-06-08 NOTE — Telephone Encounter (Signed)
Spoke with patient and clarified medication confusion. Patient is to take Losartan 25 mg twice a day.

## 2020-06-17 ENCOUNTER — Ambulatory Visit (HOSPITAL_BASED_OUTPATIENT_CLINIC_OR_DEPARTMENT_OTHER)
Admission: RE | Admit: 2020-06-17 | Discharge: 2020-06-17 | Disposition: A | Discharge status: Home / Self Care | Payer: Medicare Other | Source: Ambulatory Visit | Attending: Cardiology | Admitting: Cardiology

## 2020-06-17 ENCOUNTER — Other Ambulatory Visit: Payer: Self-pay

## 2020-06-17 DIAGNOSIS — R002 Palpitations: Secondary | ICD-10-CM | POA: Diagnosis present

## 2020-06-17 DIAGNOSIS — I2510 Atherosclerotic heart disease of native coronary artery without angina pectoris: Secondary | ICD-10-CM

## 2020-06-17 DIAGNOSIS — I471 Supraventricular tachycardia, unspecified: Secondary | ICD-10-CM

## 2020-06-17 DIAGNOSIS — I480 Paroxysmal atrial fibrillation: Secondary | ICD-10-CM | POA: Diagnosis not present

## 2020-06-17 DIAGNOSIS — E785 Hyperlipidemia, unspecified: Secondary | ICD-10-CM | POA: Diagnosis present

## 2020-06-17 DIAGNOSIS — I10 Essential (primary) hypertension: Secondary | ICD-10-CM | POA: Diagnosis present

## 2020-06-17 DIAGNOSIS — E118 Type 2 diabetes mellitus with unspecified complications: Secondary | ICD-10-CM

## 2020-06-17 LAB — ECHOCARDIOGRAM COMPLETE
Area-P 1/2: 4.17 cm2
S' Lateral: 1.71 cm

## 2020-06-20 ENCOUNTER — Telehealth: Payer: Self-pay | Admitting: Cardiology

## 2020-06-20 NOTE — Telephone Encounter (Signed)
Patient informed of results.  

## 2020-06-20 NOTE — Telephone Encounter (Signed)
Patient returning a call to discuss Echo results. Please call back

## 2020-06-22 NOTE — Telephone Encounter (Signed)
Left message for patient to return call. Patient was already informed of echo results not sure why they were calling back for that. Advised them to call back.

## 2020-06-22 NOTE — Telephone Encounter (Signed)
Pt called in returning [facility] call   Best number [phone]

## 2020-06-23 NOTE — Telephone Encounter (Signed)
Left message for patient to return call.

## 2020-06-23 NOTE — Telephone Encounter (Signed)
Patient informed of results.  

## 2020-06-27 ENCOUNTER — Ambulatory Visit: Payer: Medicare Other | Admitting: Cardiology

## 2020-06-27 ENCOUNTER — Encounter: Payer: Self-pay | Admitting: Cardiology

## 2020-06-27 ENCOUNTER — Other Ambulatory Visit: Payer: Self-pay

## 2020-06-27 VITALS — BP 140/92 | HR 104 | Ht 67.0 in | Wt 242.0 lb

## 2020-06-27 DIAGNOSIS — I2510 Atherosclerotic heart disease of native coronary artery without angina pectoris: Secondary | ICD-10-CM | POA: Diagnosis not present

## 2020-06-27 DIAGNOSIS — E785 Hyperlipidemia, unspecified: Secondary | ICD-10-CM

## 2020-06-27 DIAGNOSIS — I471 Supraventricular tachycardia, unspecified: Secondary | ICD-10-CM

## 2020-06-27 DIAGNOSIS — I10 Essential (primary) hypertension: Secondary | ICD-10-CM | POA: Diagnosis not present

## 2020-06-27 DIAGNOSIS — I480 Paroxysmal atrial fibrillation: Secondary | ICD-10-CM | POA: Diagnosis not present

## 2020-06-27 NOTE — Patient Instructions (Signed)
Medication Instructions:  Your physician recommends that you continue on your current medications as directed. Please refer to the Current Medication list given to you today.  *If you need a refill on your cardiac medications before your next appointment, please call your pharmacy*   Lab Work: Your physician recommends that you return for lab work today: bmp, lipid  If you have labs (blood work) drawn today and your tests are completely normal, you will receive your results only by: . MyChart Message (if you have MyChart) OR . A paper copy in the mail If you have any lab test that is abnormal or we need to change your treatment, we will call you to review the results.   Testing/Procedures: None   Follow-Up: At [facility], you and your health needs are our priority.  As part of our continuing mission to provide you with exceptional heart care, we have created designated Provider Care Teams.  These Care Teams include your primary Cardiologist (physician) and Advanced Practice Providers (APPs -  Physician Assistants and Nurse Practitioners) who all work together to provide you with the care you need, when you need it.  We recommend signing up for the patient portal called "MyChart".  Sign up information is provided on this After Visit Summary.  MyChart is used to connect with patients for Virtual Visits (Telemedicine).  Patients are able to view lab/test results, encounter notes, upcoming appointments, etc.  Non-urgent messages can be sent to your provider as well.   To learn more about what you can do with MyChart, go to [email or web address].    Your next appointment:   3 month(s)  The format for your next appointment:   In Person  Provider:   Robert Krasowski, MD   Other Instructions   

## 2020-06-27 NOTE — Progress Notes (Signed)
Cardiology Office Note:    Date:  06/27/2020   ID:  Tyler Ellis, DOB Aug 06, 1950, MRN [id number]  PCP:  Forrest Moron, MD  Cardiologist:  Gypsy Balsam, MD    Referring MD: Forrest Moron, MD   Chief Complaint  Patient presents with  . Follow-up  I am doing fine  History of Present Illness:    Tyler Ellis is a 70 y.o. male with past medical history significant for supraventricular tachycardia, essential hypertension, diabetes, coronary artery disease with cardiac catheterization done in 2019 showing only 10% narrowings.  Few months ago he went to [facility] to have urological procedure done.  When he was getting ready for procedure he was fine to have narrow complex tachycardia.  At that time cardiologist Dr. Ilda Basset called me and told me that he is in atrial fibrillation the plan was to initiate anticoagulation as well as flecainide.  It was done and he was referred back to me.  When I did see him he was not taking any anticoagulation.  He said he cannot afford Eliquis.  He was still taking flecainide.  I did put monitor on him trying to see if he has still some recurrences of atrial fibrillation and simply have documentation of it however that monitor showed episodes of narrow complex tachycardia.  Longest episode was 6 hours 51 minutes with an average heart rate about 170.  Maximal heart rate was 200 bpm.  It was completely asymptomatic.  No dizziness no passing out. He comes today 2 months of follow-up overall doing well.  He thought he was doing great no chest pain tightness squeezing pressure burning chest.  Past Medical History:  Diagnosis Date  . Atypical chest pain 10/28/2019  . Coronary artery disease   . Diabetes mellitus (HCC) 02/02/2014  . Diabetes mellitus without complication (HCC)   . Dyslipidemia 03/11/2017  . 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 Enlarged prostate with urinary obstruction 05/11/2020  . Essential hypertension  03/11/2017  . Hypertension   . Hyperthyroidism 06/26/2017  . Medication management 06/06/2020  . Multinodular goiter 07/03/2017  . Nocturia 02/02/2014  . NSTEMI (non-ST elevated myocardial infarction) (HCC) 02/27/2017  . Palpitations 10/28/2019  . Paroxysmal atrial fibrillation (HCC) 05/24/2020  . Supraventricular tachycardia (HCC) 02/14/2018  . Type 2 diabetes mellitus with complication, without long-term current use of insulin (HCC) 03/11/2017    Past Surgical History:  Procedure Laterality Date  . KNEE SURGERY    . LEFT HEART CATH AND CORONARY ANGIOGRAPHY N/A 02/28/2017   Procedure: LEFT HEART CATH AND CORONARY ANGIOGRAPHY;  Surgeon: Kathleene Hazel, MD;  Location: [facility];  Service: Cardiovascular;  Laterality: N/A;    Current Medications: Current Meds  Medication Sig  . acetaminophen-codeine (TYLENOL #3) 300-30 MG tablet Take 1 tablet every 8 (eight) hours as needed by mouth for moderate pain.   Marland Kitchen ALPRAZolam (XANAX) 0.5 MG tablet Take 0.25-0.5 mg by mouth 3 (three) times daily as needed for anxiety.  Marland Kitchen aspirin EC 81 MG EC tablet Take 1 tablet (81 mg total) daily by mouth.  Marland Kitchen atorvastatin (LIPITOR) 80 MG tablet Take 1 tablet (80 mg total) daily by mouth.  . diltiazem (CARDIZEM CD) 360 MG 24 hr capsule Take 1 capsule (360 mg total) by mouth daily.  . flecainide (TAMBOCOR) 50 MG tablet Take 1 tablet (50 mg total) by mouth 2 (two) times daily. Every 12 hours  . glipiZIDE (GLUCOTROL) 10 MG tablet Take 10 mg 2 (two) times daily  before a meal by mouth.   Marland Kitchen HUMULIN N KWIKPEN 100 UNIT/ML Kiwkpen Inject 51 Units into the skin daily.  . hydrochlorothiazide (HYDRODIURIL) 25 MG tablet Take 25 mg daily by mouth.  . hydrOXYzine (ATARAX/VISTARIL) 10 MG tablet Take 10 mg by mouth in the morning and at bedtime.  Marland Kitchen losartan (COZAAR) 25 MG tablet Take 1 tablet (25 mg total) by mouth in the morning and at bedtime.  . magnesium chloride (SLOW-MAG) 64 MG TBEC SR tablet Take 1 tablet (64 mg  total) by mouth 2 (two) times daily.  . metFORMIN (GLUCOPHAGE) 1000 MG tablet Take 1,000 mg by mouth 2 (two) times daily.  . metoprolol succinate (TOPROL-XL) 50 MG 24 hr tablet TAKE 3 TABLETS BY MOUTH DAILY WITH OR IMMEDIATELY FOLLOWING A MEAL  . nitroGLYCERIN (NITROSTAT) 0.4 MG SL tablet Place 1 tablet (0.4 mg total) under the tongue every 5 (five) minutes as needed for chest pain.  . tamsulosin (FLOMAX) 0.4 MG CAPS capsule Take 0.4 mg by mouth daily.  . [DISCONTINUED] diazepam (VALIUM) 5 MG tablet Take 2.5-5 mg 3 (three) times daily as needed by mouth for anxiety.      Allergies:   Patient has no known allergies.   Social History   Socioeconomic History  . Marital status: Divorced    Spouse name: Not on file  . Number of children: Not on file  . Years of education: Not on file  . Highest education level: Not on file  Occupational History  . Not on file  Tobacco Use  . Smoking status: Never Smoker  . Smokeless tobacco: Never Used  Vaping Use  . Vaping Use: Never used  Substance and Sexual Activity  . Alcohol use: Yes    Comment: weekly  . Drug use: No  . Sexual activity: Not on file  Other Topics Concern  . Not on file  Social History Narrative  . Not on file   Social Determinants of Health   Financial Resource Strain: Not on file  Food Insecurity: Not on file  Transportation Needs: Not on file  Physical Activity: Not on file  Stress: Not on file  Social Connections: Not on file     Family History: The patient's family history includes Hypertension in his father and mother. ROS:   Please see the history of present illness.    All 14 point review of systems negative except as described per history of present illness  EKGs/Labs/Other Studies Reviewed:      Recent Labs: 10/28/2019: NT-Pro BNP 81 11/17/2019: BUN 16; Creatinine, Ser 1.07; Potassium 4.1; Sodium 140  Recent Lipid Panel    Component Value Date/Time   CHOL 191 02/28/2017 0017   TRIG 288 (H)  02/28/2017 0017   HDL 52 02/28/2017 0017   CHOLHDL 3.7 02/28/2017 0017   VLDL 58 (H) 02/28/2017 0017   LDLCALC 81 02/28/2017 0017    Physical Exam:    VS:  BP (!) 140/92 (BP Location: Right Arm, Patient Position: Sitting)   Pulse (!) 104   Ht 5\' 7"  (1.702 m)   Wt 242 lb (109.8 kg)   SpO2 94%   BMI 37.90 kg/m     Wt Readings from Last 3 Encounters:  06/27/20 242 lb (109.8 kg)  06/06/20 247 lb (112 kg)  05/24/20 246 lb (111.6 kg)     GEN:  Well nourished, well developed in no acute distress HEENT: Normal NECK: No JVD; No carotid bruits LYMPHATICS: No lymphadenopathy CARDIAC: RRR, no murmurs, no rubs, no  gallops RESPIRATORY:  Clear to auscultation without rales, wheezing or rhonchi  ABDOMEN: Soft, non-tender, non-distended MUSCULOSKELETAL:  No edema; No deformity  SKIN: Warm and dry LOWER EXTREMITIES: no swelling NEUROLOGIC:  Alert and oriented x 3 PSYCHIATRIC:  Normal affect   ASSESSMENT:    1. Coronary artery disease involving native coronary artery of native heart without angina pectoris   2. Supraventricular tachycardia (HCC)   3. Paroxysmal atrial fibrillation (HCC)   4. Essential hypertension   5. Dyslipidemia    PLAN:    In order of problems listed above:  1. Supraventricular tachycardia.  He will be referred to our EP team for consideration of SVT ablation.  He is taking already Cardizem as well as beta-blocker and flecainide in spite of that he does have breakthrough arrhythmias. 2. Paroxysmal atrial fibrillation.  Refused anticoagulation because of price of the medication.  We will continue suppressing him with flecainide. 3. Coronary artery disease with 10% narrowing based on cardiac catheterization 2019.  Asymptomatic doing well. 4. Essential hypertension still not well controlled, I will check his Chem-7 if Chem-7 is appropriate will modify his medications. 5. Dyslipidemia: Again I do not have fasting lipid profile.  He is on high intense statin which I  will continue cholesterol will be checked.  I did talk to Dr. Elberta Fortis about his case he will see him this coming Friday   Medication Adjustments/Labs and Tests Ordered: Current medicines are reviewed at length with the patient today.  Concerns regarding medicines are outlined above.  No orders of the defined types were placed in this encounter.  Medication changes: No orders of the defined types were placed in this encounter.   Signed, Georgeanna Lea, MD, [facility] 06/27/2020 1:57 PM    [facility]

## 2020-06-27 NOTE — Addendum Note (Signed)
Addended by: Hazle Quant on: 06/27/2020 02:02 PM   Modules accepted: Orders

## 2020-06-28 LAB — BASIC METABOLIC PANEL
BUN/Creatinine Ratio: 18 (ref 10–24)
BUN: 18 mg/dL (ref 8–27)
CO2: 23 mmol/L (ref 20–29)
Calcium: 11 mg/dL — ABNORMAL HIGH (ref 8.6–10.2)
Chloride: 98 mmol/L (ref 96–106)
Creatinine, Ser: 1.01 mg/dL (ref 0.76–1.27)
Glucose: 96 mg/dL (ref 65–99)
Potassium: 4.4 mmol/L (ref 3.5–5.2)
Sodium: 143 mmol/L (ref 134–144)
eGFR: 81 mL/min/{1.73_m2} (ref >59)

## 2020-06-28 LAB — LIPID PANEL
Chol/HDL Ratio: 3.8 ratio (ref 0.0–5.0)
Cholesterol, Total: 140 mg/dL (ref 100–199)
HDL: 37 mg/dL — ABNORMAL LOW (ref >39)
LDL Chol Calc (NIH): 86 mg/dL (ref 0–99)
Triglycerides: 89 mg/dL (ref 0–149)
VLDL Cholesterol Cal: 17 mg/dL (ref 5–40)

## 2020-06-30 ENCOUNTER — Telehealth: Payer: Self-pay

## 2020-06-30 ENCOUNTER — Other Ambulatory Visit: Payer: Self-pay | Admitting: Cardiology

## 2020-06-30 MED ORDER — EZETIMIBE 10 MG PO TABS: 10.0000 mg | ORAL_TABLET | Freq: Every day | ORAL | 1 refills | Status: DC

## 2020-06-30 MED ORDER — LOSARTAN POTASSIUM 50 MG PO TABS: 50.0000 mg | ORAL_TABLET | Freq: Every day | ORAL | 1 refills | Status: DC

## 2020-06-30 NOTE — Telephone Encounter (Signed)
Patient notified of the following recommendation per Dr. Bing Matter. He agrees with plan. Both meds are updated and sent. Patient want to have his PCP draw blood work. I provided the test information, our fax number and he is to get back to me to update me if PCP will add BMP.

## 2020-06-30 NOTE — Telephone Encounter (Signed)
-----   Message from Georgeanna Lea, MD sent at 06/29/2020 12:58 PM EST ----- Please increase losartan to 50 mg daily, he did have Chem-7 done within the next 10 days, also add Zetia 10 mg daily to his medical regimen

## 2020-07-01 ENCOUNTER — Other Ambulatory Visit: Payer: Self-pay

## 2020-07-01 ENCOUNTER — Ambulatory Visit: Payer: Medicare Other | Admitting: Cardiology

## 2020-07-01 ENCOUNTER — Encounter: Payer: Self-pay | Admitting: Cardiology

## 2020-07-01 VITALS — BP 132/66 | HR 98 | Ht 67.0 in | Wt 248.4 lb

## 2020-07-01 DIAGNOSIS — I471 Supraventricular tachycardia: Secondary | ICD-10-CM | POA: Diagnosis not present

## 2020-07-01 MED ORDER — METOPROLOL SUCCINATE ER 200 MG PO TB24: 200.0000 mg | ORAL_TABLET | Freq: Every day | ORAL | 3 refills | Status: DC

## 2020-07-01 NOTE — Patient Instructions (Addendum)
Medication Instructions:  Your physician has recommended you make the following change in your medication:  1. INCREASE Toprol to 200 mg daily  *If you need a refill on your cardiac medications before your next appointment, please call your pharmacy*   Lab Work: None ordered   Testing/Procedures: None ordered   Follow-Up: At [facility], you and your health needs are our priority.  As part of our continuing mission to provide you with exceptional heart care, we have created designated Provider Care Teams.  These Care Teams include your primary Cardiologist (physician) and Advanced Practice Providers (APPs -  Physician Assistants and Nurse Practitioners) who all work together to provide you with the care you need, when you need it.  We recommend signing up for the patient portal called "MyChart".  Sign up information is provided on this After Visit Summary.  MyChart is used to connect with patients for Virtual Visits (Telemedicine).  Patients are able to view lab/test results, encounter notes, upcoming appointments, etc.  Non-urgent messages can be sent to your provider as well.   To learn more about what you can do with MyChart, go to [email or web address].    Your next appointment:   April 18th @ 3:00 pm at the [facility] office  The format for your next appointment:   In Person  Provider:   Loman Brooklyn, MD    Thank you for choosing [facility]!!   Dory Horn, RN [phone]

## 2020-07-01 NOTE — Progress Notes (Signed)
Electrophysiology Office Note   Date:  07/01/2020   ID:  Tyler Ellis, DOB [birth year], MRN [id number]  PCP:  Tyler Moron, MD  Cardiologist: Tyler Ellis Primary Electrophysiologist:  Tyler Sindelar [name], MD    Chief Complaint: SVT   History of Present Illness: Tyler Ellis is a 70 y.o. male who is being seen today for the evaluation of SVT at the request of Tyler Lea, MD. Presenting today for electrophysiology evaluation.  He has a history significant for mild coronary artery disease, diabetes, hypertension, paroxysmal atrial fibrillation, SVT.  He was having a urologic procedure when he was noted to have a narrow complex tachycardia.  He was told he was in atrial fibrillation at the time and was started on flecainide.  He wore a cardiac monitor which showed a few recurrences of atrial fibrillation as well as multiple episodes of a narrow complex tachycardia with heart rates of 170.  He was completely asymptomatic during these episodes.  Today, he denies symptoms of palpitations, chest pain, shortness of breath, orthopnea, PND, lower extremity edema, claudication, dizziness, presyncope, syncope, bleeding, or neurologic sequela. The patient is tolerating medications without difficulties.  He notes intermittent palpitations.  He was started on the medication a few weeks ago which improved his palpitations, though they have continued.  He has no chest pain, but does note some fatigue and shortness of breath when he has palpitations.   Past Medical History:  Diagnosis Date  . Atypical chest pain 10/28/2019  . Coronary artery disease   . Diabetes mellitus (HCC) 02/02/2014  . Diabetes mellitus without complication (HCC)   . Dyslipidemia 03/11/2017  . 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 Enlarged prostate with urinary obstruction 05/11/2020  . Essential hypertension 03/11/2017  . Hypertension   . Hyperthyroidism 06/26/2017  . Medication management 06/06/2020  .  Multinodular goiter 07/03/2017  . Nocturia 02/02/2014  . NSTEMI (non-ST elevated myocardial infarction) (HCC) 02/27/2017  . Palpitations 10/28/2019  . Paroxysmal atrial fibrillation (HCC) 05/24/2020  . Supraventricular tachycardia (HCC) 02/14/2018  . Type 2 diabetes mellitus with complication, without long-term current use of insulin (HCC) 03/11/2017   Past Surgical History:  Procedure Laterality Date  . KNEE SURGERY    . LEFT HEART CATH AND CORONARY ANGIOGRAPHY N/A 02/28/2017   Procedure: LEFT HEART CATH AND CORONARY ANGIOGRAPHY;  Surgeon: Kathleene Hazel, MD;  Location: [facility]VASIVE CV LAB;  Service: Cardiovascular;  Laterality: N/A;     Current Outpatient Medications  Medication Sig Dispense Refill  . acetaminophen-codeine (TYLENOL #3) 300-30 MG tablet Take 1 tablet every 8 (eight) hours as needed by mouth for moderate pain.     Marland Kitchen ALPRAZolam (XANAX) 0.5 MG tablet Take 0.25-0.5 mg by mouth 3 (three) times daily as needed for anxiety.    Marland Kitchen aspirin EC 81 MG EC tablet Take 1 tablet (81 mg total) daily by mouth. 30 tablet 11  . atorvastatin (LIPITOR) 80 MG tablet Take 1 tablet (80 mg total) daily by mouth. 30 tablet 3  . ezetimibe (ZETIA) 10 MG tablet Take 1 tablet (10 mg total) by mouth daily. 30 tablet 1  . flecainide (TAMBOCOR) 50 MG tablet Take 1 tablet (50 mg total) by mouth 2 (two) times daily. Every 12 hours 60 tablet 1  . glipiZIDE (GLUCOTROL) 10 MG tablet Take 10 mg 2 (two) times daily before a meal by mouth.     Marland Kitchen HUMULIN N KWIKPEN 100 UNIT/ML Kiwkpen Inject 51 Units into the skin daily.    Marland Kitchen  hydrochlorothiazide (HYDRODIURIL) 25 MG tablet Take 25 mg daily by mouth.    . hydrOXYzine (ATARAX/VISTARIL) 10 MG tablet Take 10 mg by mouth in the morning and at bedtime.    Marland Kitchen losartan (COZAAR) 50 MG tablet Take 1 tablet (50 mg total) by mouth daily. 30 tablet 1  . magnesium chloride (SLOW-MAG) 64 MG TBEC SR tablet Take 1 tablet (64 mg total) by mouth 2 (two) times daily. 60 tablet 5  .  metFORMIN (GLUCOPHAGE) 1000 MG tablet Take 1,000 mg by mouth 2 (two) times daily.    . metoprolol succinate (TOPROL-XL) 200 MG 24 hr tablet Take 1 tablet (200 mg total) by mouth daily. Take with or immediately following a meal. 30 tablet 3  . tamsulosin (FLOMAX) 0.4 MG CAPS capsule Take 0.4 mg by mouth daily.    Marland Kitchen diltiazem (CARDIZEM CD) 360 MG 24 hr capsule Take 1 capsule (360 mg total) by mouth daily. 90 capsule 1  . nitroGLYCERIN (NITROSTAT) 0.4 MG SL tablet Place 1 tablet (0.4 mg total) under the tongue every 5 (five) minutes as needed for chest pain. 25 tablet 2   No current facility-administered medications for this visit.    Allergies:   Patient has no known allergies.   Social History:  The patient  reports that he has never smoked. He has never used smokeless tobacco. He reports current alcohol use. He reports that he does not use drugs.   Family History:  The patient's family history includes Hypertension in his father and mother.    ROS:  Please see the history of present illness.   Otherwise, review of systems is positive for none.   All other systems are reviewed and negative.    PHYSICAL EXAM: VS:  BP 132/66   Pulse 98   Ht 5\' 7"  (1.702 m)   Wt 248 lb 6.4 oz (112.7 kg)   SpO2 94%   BMI 38.90 kg/m  , BMI Body mass index is 38.9 kg/m. GEN: Well nourished, well developed, in no acute distress  HEENT: normal  Neck: no JVD, carotid bruits, or masses Cardiac: RRR; no murmurs, rubs, or gallops,no edema  Respiratory:  clear to auscultation bilaterally, normal work of breathing GI: soft, nontender, nondistended, + BS MS: no deformity or atrophy  Skin: warm and dry Neuro:  Strength and sensation are intact Psych: euthymic mood, full affect  EKG:  EKG is not ordered today. Personal review of the ekg ordered 06/29/19 shows sinus rhythm, rate 108, biatrial enlargement  Recent Labs: 10/28/2019: NT-Pro BNP 81 06/27/2020: BUN 18; Creatinine, Ser 1.01; Potassium 4.4; Sodium 143     Lipid Panel     Component Value Date/Time   CHOL 140 06/27/2020 1405   TRIG 89 06/27/2020 1405   HDL 37 (L) 06/27/2020 1405   CHOLHDL 3.8 06/27/2020 1405   CHOLHDL 3.7 02/28/2017 0017   VLDL 58 (H) 02/28/2017 0017   LDLCALC 86 06/27/2020 1405     Wt Readings from Last 3 Encounters:  07/01/20 248 lb 6.4 oz (112.7 kg)  06/27/20 242 lb (109.8 kg)  06/06/20 247 lb (112 kg)      Other studies Reviewed: Additional studies/ records that were reviewed today include: TTE 06/17/20  Review of the above records today demonstrates:  1. Left ventricular ejection fraction, by estimation, is 60 to 65%. The  left ventricle has normal function. The left ventricle has no regional  wall motion abnormalities. Left ventricular diastolic parameters are  consistent with Grade I diastolic  dysfunction (  impaired relaxation).  2. Right ventricular systolic function is normal. The right ventricular  size is normal.  3. The mitral valve is normal in structure. No evidence of mitral valve  regurgitation. No evidence of mitral stenosis.  4. The aortic valve is normal in structure. Aortic valve regurgitation is  not visualized. No aortic stenosis is present.  5. The inferior vena cava is normal in size with greater than 50%  respiratory variability, suggesting right atrial pressure of 3 mmHg.   Cardiac monitor 06/03/2020 personally reviewed Narrow complex tachycardia noted, 5 episodes.  The longest was 6 hours and 51 minutes average heart rate was 175/min.  And maximum was 200/min.  ASSESSMENT AND PLAN:  1.  SVT: On cardiac monitor, has a short RP tachycardia.  Spoke with him about the option of ablation versus continued medical management.  At this point, he would like to try to avoid procedures.  Due to that, we Elsa Ploch increase his Toprol-XL to 200 mg daily.  He Delaine Canter also take his diltiazem and flecainide.  If he continues to have episodes of SVT, he would benefit from ablation which he  understands.  2.  Paroxysmal atrial fibrillation: Currently on flecainide and diltiazem.  CHA2DS2-VASc of 4.  Case discussed with primary cardiology  Current medicines are reviewed at length with the patient today.   The patient does not have concerns regarding his medicines.  The following changes were made today:  none  Labs/ tests ordered today include:  No orders of the defined types were placed in this encounter.    Disposition:   FU with Blannie Shedlock 6 weeks  Signed, Shantina Chronister [name], MD  07/01/2020 11:31 AM     [facility] [address] [place] [address] [phone] (office) [phone] (fax)

## 2020-07-06 ENCOUNTER — Ambulatory Visit: Payer: Medicare Other | Admitting: Cardiology

## 2020-07-08 LAB — LAB REPORT - SCANNED
A1c: 7.9
EGFR: 72

## 2020-08-08 ENCOUNTER — Ambulatory Visit: Payer: Medicare Other | Admitting: Cardiology

## 2020-08-08 ENCOUNTER — Other Ambulatory Visit: Payer: Self-pay | Admitting: Cardiology

## 2020-08-08 NOTE — Telephone Encounter (Signed)
Flecainide 50 mg # 60 x 5 refills to pharmacy

## 2020-08-10 NOTE — Progress Notes (Deleted)
Cardiology Office Note Date:  08/10/2020  Patient ID:  Dewight, Catino 25-Feb-1951, MRN [id number] PCP:  Forrest Moron, MD  Cardiologist:  Dr. Bing Matter Electrophysiologist: Dr. Elberta Fortis  ***refresh   Chief Complaint: ***  History of Present Illness: Brayn Eckstein is a 70 y.o. male with history of HTN, HLD, DM, NOD by cath in 2019, AFib (not on a/c pt choice), SVT  He comes in today to be seen for Dr. Elberta Fortis, last seen by him 07/01/20 in consultations, his palpitations were reduced with the start of flecainide but not resolved. In review of his monitor noted his SVT to be an short RP tachycardia. The pt wanted to avoid procedures and his Toprol was increased planned for 6 week f/u   Afib HX Diagnosed Jan 2022  AAD Flecainide started Jan 2022   Past Medical History:  Diagnosis Date  . Atypical chest pain 10/28/2019  . Coronary artery disease   . Diabetes mellitus (HCC) 02/02/2014  . Diabetes mellitus without complication (HCC)   . Dyslipidemia 03/11/2017  . 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 Enlarged prostate with urinary obstruction 05/11/2020  . Essential hypertension 03/11/2017  . Hypertension   . Hyperthyroidism 06/26/2017  . Medication management 06/06/2020  . Multinodular goiter 07/03/2017  . Nocturia 02/02/2014  . NSTEMI (non-ST elevated myocardial infarction) (HCC) 02/27/2017  . Palpitations 10/28/2019  . Paroxysmal atrial fibrillation (HCC) 05/24/2020  . Supraventricular tachycardia (HCC) 02/14/2018  . Type 2 diabetes mellitus with complication, without long-term current use of insulin (HCC) 03/11/2017    Past Surgical History:  Procedure Laterality Date  . KNEE SURGERY    . LEFT HEART CATH AND CORONARY ANGIOGRAPHY N/A 02/28/2017   Procedure: LEFT HEART CATH AND CORONARY ANGIOGRAPHY;  Surgeon: Kathleene Hazel, MD;  Location: [facility];  Service: Cardiovascular;  Laterality: N/A;    Current Outpatient Medications  Medication  Sig Dispense Refill  . acetaminophen-codeine (TYLENOL #3) 300-30 MG tablet Take 1 tablet every 8 (eight) hours as needed by mouth for moderate pain.     Marland Kitchen ALPRAZolam (XANAX) 0.5 MG tablet Take 0.25-0.5 mg by mouth 3 (three) times daily as needed for anxiety.    Marland Kitchen aspirin EC 81 MG EC tablet Take 1 tablet (81 mg total) daily by mouth. 30 tablet 11  . atorvastatin (LIPITOR) 80 MG tablet Take 1 tablet (80 mg total) daily by mouth. 30 tablet 3  . diltiazem (CARDIZEM CD) 360 MG 24 hr capsule Take 1 capsule (360 mg total) by mouth daily. 90 capsule 1  . ezetimibe (ZETIA) 10 MG tablet Take 1 tablet (10 mg total) by mouth daily. 30 tablet 1  . flecainide (TAMBOCOR) 50 MG tablet TAKE 1 TABLET BY MOUTH EVERY 12 HOURS 60 tablet 5  . glipiZIDE (GLUCOTROL) 10 MG tablet Take 10 mg 2 (two) times daily before a meal by mouth.     Marland Kitchen HUMULIN N KWIKPEN 100 UNIT/ML Kiwkpen Inject 51 Units into the skin daily.    . hydrochlorothiazide (HYDRODIURIL) 25 MG tablet Take 25 mg daily by mouth.    . hydrOXYzine (ATARAX/VISTARIL) 10 MG tablet Take 10 mg by mouth in the morning and at bedtime.    Marland Kitchen losartan (COZAAR) 50 MG tablet Take 1 tablet (50 mg total) by mouth daily. 30 tablet 1  . magnesium chloride (SLOW-MAG) 64 MG TBEC SR tablet Take 1 tablet (64 mg total) by mouth 2 (two) times daily. 60 tablet 5  . metFORMIN (GLUCOPHAGE) 1000 MG tablet Take  1,000 mg by mouth 2 (two) times daily.    . metoprolol succinate (TOPROL-XL) 200 MG 24 hr tablet Take 1 tablet (200 mg total) by mouth daily. Take with or immediately following a meal. 30 tablet 3  . nitroGLYCERIN (NITROSTAT) 0.4 MG SL tablet Place 1 tablet (0.4 mg total) under the tongue every 5 (five) minutes as needed for chest pain. 25 tablet 2  . tamsulosin (FLOMAX) 0.4 MG CAPS capsule Take 0.4 mg by mouth daily.     No current facility-administered medications for this visit.    Allergies:   Patient has no known allergies.   Social History:  The patient  reports that he  has never smoked. He has never used smokeless tobacco. He reports current alcohol use. He reports that he does not use drugs.   Family History:  The patient's family history includes Hypertension in his father and mother.  ROS:  Please see the history of present illness.    All other systems are reviewed and otherwise negative.   PHYSICAL EXAM:  VS:  There were no vitals taken for this visit. BMI: There is no height or weight on file to calculate BMI. Well nourished, well developed, in no acute distress HEENT: normocephalic, atraumatic Neck: no JVD, carotid bruits or masses Cardiac:  *** RRR; no significant murmurs, no rubs, or gallops Lungs:  *** CTA b/l, no wheezing, rhonchi or rales Abd: soft, nontender MS: no deformity or *** atrophy Ext: *** no edema Skin: warm and dry, no rash Neuro:  No gross deficits appreciated Psych: euthymic mood, full affect    EKG:  Done today and reviewed by myself shows  ***  TTE 06/17/20  1. Left ventricular ejection fraction, by estimation, is 60 to 65%. The  left ventricle has normal function. The left ventricle has no regional  wall motion abnormalities. Left ventricular diastolic parameters are  consistent with Grade I diastolic  dysfunction (impaired relaxation).  2. Right ventricular systolic function is normal. The right ventricular  size is normal.  3. The mitral valve is normal in structure. No evidence of mitral valve  regurgitation. No evidence of mitral stenosis.  4. The aortic valve is normal in structure. Aortic valve regurgitation is  not visualized. No aortic stenosis is present.  5. The inferior vena cava is normal in size with greater than 50%  respiratory variability, suggesting right atrial pressure of 3 mmHg.     Cardiac monitor 06/03/2020  Narrow complex tachycardia noted, 5 episodes. The longest was 6 hours and 51 minutes average heart rate was 175/min. And maximum was 200/min.   Recent Labs: 10/28/2019:  NT-Pro BNP 81 06/27/2020: BUN 18; Creatinine, Ser 1.01; Potassium 4.4; Sodium 143  06/27/2020: Chol/HDL Ratio 3.8; Cholesterol, Total 140; HDL 37; LDL Chol Calc (NIH) 86; Triglycerides 89   CrCl cannot be calculated (Patient's most recent lab result is older than the maximum 21 days allowed.).   Wt Readings from Last 3 Encounters:  07/01/20 248 lb 6.4 oz (112.7 kg)  06/27/20 242 lb (109.8 kg)  06/06/20 247 lb (112 kg)     Other studies reviewed: Additional studies/records reviewed today include: summarized above  ASSESSMENT AND PLAN:  1. PSVT     shoret RP 2. Paroxysmal Afib     CHA2DS2Vasc is 4     Pt declined a/c      On flecainide w/metoprolol/diltiazem ***  3. HTN     ***  Disposition: F/u with ***  Current medicines are reviewed at length  with the patient today.  The patient did not have any concerns regarding medicines.  Norma Fredrickson, PA-C 08/10/2020 7:34 PM     [facility] [address] [place] [address] [phone] (office)  [phone] (fax)

## 2020-08-11 ENCOUNTER — Ambulatory Visit: Payer: Medicare Other | Admitting: Physician Assistant

## 2020-08-12 ENCOUNTER — Other Ambulatory Visit: Payer: Self-pay

## 2020-08-12 ENCOUNTER — Encounter: Payer: Self-pay | Admitting: Physician Assistant

## 2020-08-12 ENCOUNTER — Ambulatory Visit: Payer: Medicare Other | Admitting: Physician Assistant

## 2020-08-12 VITALS — BP 144/64 | HR 104 | Ht 67.0 in | Wt 243.0 lb

## 2020-08-12 DIAGNOSIS — I471 Supraventricular tachycardia: Secondary | ICD-10-CM

## 2020-08-12 DIAGNOSIS — I480 Paroxysmal atrial fibrillation: Secondary | ICD-10-CM | POA: Diagnosis not present

## 2020-08-12 DIAGNOSIS — I10 Essential (primary) hypertension: Secondary | ICD-10-CM

## 2020-08-12 DIAGNOSIS — Z79899 Other long term (current) drug therapy: Secondary | ICD-10-CM

## 2020-08-12 DIAGNOSIS — Z5181 Encounter for therapeutic drug level monitoring: Secondary | ICD-10-CM

## 2020-08-12 MED ORDER — ELIQUIS 5 MG PO TABS: 5.0000 mg | ORAL_TABLET | Freq: Two times a day (BID) | ORAL | 1 refills | Status: DC

## 2020-08-12 NOTE — Progress Notes (Signed)
Cardiology Office Note Date:  08/12/2020  Patient ID:  Tyler Ellis, Tyler Ellis 1950/10/01, MRN [id number] PCP:  Forrest Moron, MD  Cardiologist:  Dr. Bing Matter Electrophysiologist: Dr. Elberta Fortis     Chief Complaint: planned follow up   History of Present Illness: Tyler Ellis is a 70 y.o. male with history of HTN, HLD, DM, NOD by cath in 2019, AFib (not on a/c pt choice), SVT  He comes in today to be seen for Dr. Elberta Fortis, last seen by him 07/01/20 in consultations, his palpitations were reduced with the start of flecainide but not resolved. In review of his monitor noted his SVT to be an short RP tachycardia. The pt wanted to avoid procedures and his Toprol was increased planned for 6 week f/u  TODAY He feels quite well. Has not had any further palpitations. No CP no SOB No near syncope or syncope, but when he lays down in bed at night has the sensation that the room spins a little.  His PMD started him on a medicine for this that has helped but not resolved it.  Afib HX Diagnosed Jan 2022  AAD Flecainide started Jan 2022   Past Medical History:  Diagnosis Date  . Atypical chest pain 10/28/2019  . Coronary artery disease   . Diabetes mellitus (HCC) 02/02/2014  . Diabetes mellitus without complication (HCC)   . Dyslipidemia 03/11/2017  . 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 Enlarged prostate with urinary obstruction 05/11/2020  . Essential hypertension 03/11/2017  . Hypertension   . Hyperthyroidism 06/26/2017  . Medication management 06/06/2020  . Multinodular goiter 07/03/2017  . Nocturia 02/02/2014  . NSTEMI (non-ST elevated myocardial infarction) (HCC) 02/27/2017  . Palpitations 10/28/2019  . Paroxysmal atrial fibrillation (HCC) 05/24/2020  . Supraventricular tachycardia (HCC) 02/14/2018  . Type 2 diabetes mellitus with complication, without long-term current use of insulin (HCC) 03/11/2017    Past Surgical History:  Procedure Laterality Date  . KNEE  SURGERY    . LEFT HEART CATH AND CORONARY ANGIOGRAPHY N/A 02/28/2017   Procedure: LEFT HEART CATH AND CORONARY ANGIOGRAPHY;  Surgeon: Kathleene Hazel, MD;  Location: [facility];  Service: Cardiovascular;  Laterality: N/A;    Current Outpatient Medications  Medication Sig Dispense Refill  . acetaminophen-codeine (TYLENOL #3) 300-30 MG tablet Take 1 tablet every 8 (eight) hours as needed by mouth for moderate pain.     Marland Kitchen ALPRAZolam (XANAX) 0.5 MG tablet Take 0.25-0.5 mg by mouth 3 (three) times daily as needed for anxiety.    Marland Kitchen aspirin EC 81 MG EC tablet Take 1 tablet (81 mg total) daily by mouth. 30 tablet 11  . atorvastatin (LIPITOR) 80 MG tablet Take 1 tablet (80 mg total) daily by mouth. 30 tablet 3  . diltiazem (CARDIZEM CD) 360 MG 24 hr capsule Take 1 capsule (360 mg total) by mouth daily. 90 capsule 1  . ezetimibe (ZETIA) 10 MG tablet Take 1 tablet (10 mg total) by mouth daily. 30 tablet 1  . flecainide (TAMBOCOR) 50 MG tablet TAKE 1 TABLET BY MOUTH EVERY 12 HOURS 60 tablet 5  . glipiZIDE (GLUCOTROL) 10 MG tablet Take 10 mg 2 (two) times daily before a meal by mouth.     Marland Kitchen HUMULIN N KWIKPEN 100 UNIT/ML Kiwkpen Inject 51 Units into the skin daily.    . hydrochlorothiazide (HYDRODIURIL) 25 MG tablet Take 25 mg daily by mouth.    . hydrOXYzine (ATARAX/VISTARIL) 10 MG tablet Take 10 mg by mouth in the  morning and at bedtime.    Marland Kitchen losartan (COZAAR) 50 MG tablet Take 1 tablet (50 mg total) by mouth daily. 30 tablet 1  . magnesium chloride (SLOW-MAG) 64 MG TBEC SR tablet Take 1 tablet (64 mg total) by mouth 2 (two) times daily. 60 tablet 5  . metFORMIN (GLUCOPHAGE) 1000 MG tablet Take 1,000 mg by mouth 2 (two) times daily.    . metoprolol succinate (TOPROL-XL) 200 MG 24 hr tablet Take 1 tablet (200 mg total) by mouth daily. Take with or immediately following a meal. 30 tablet 3  . nitroGLYCERIN (NITROSTAT) 0.4 MG SL tablet Place 1 tablet (0.4 mg total) under the tongue every 5  (five) minutes as needed for chest pain. 25 tablet 2  . tamsulosin (FLOMAX) 0.4 MG CAPS capsule Take 0.4 mg by mouth daily.     No current facility-administered medications for this visit.    Allergies:   Patient has no known allergies.   Social History:  The patient  reports that he has never smoked. He has never used smokeless tobacco. He reports current alcohol use. He reports that he does not use drugs.   Family History:  The patient's family history includes Hypertension in his father and mother.  ROS:  Please see the history of present illness.    All other systems are reviewed and otherwise negative.   PHYSICAL EXAM:  VS:  There were no vitals taken for this visit. BMI: There is no height or weight on file to calculate BMI. Well nourished, well developed, in no acute distress HEENT: normocephalic, atraumatic Neck: no JVD, carotid bruits or masses Cardiac:  RRR; no significant murmurs, no rubs, or gallops Lungs:  CTA b/l, no wheezing, rhonchi or rales Abd: soft, nontender, obese MS: no deformity or atrophy Ext:  no edema Skin: warm and dry, no rash Neuro:  No gross deficits appreciated Psych: euthymic mood, full affect    EKG:  Done today and reviewed by myself shows  ST 104bpm, PR , QRS 74ms  TTE 06/17/20  1. Left ventricular ejection fraction, by estimation, is 60 to 65%. The  left ventricle has normal function. The left ventricle has no regional  wall motion abnormalities. Left ventricular diastolic parameters are  consistent with Grade I diastolic  dysfunction (impaired relaxation).  2. Right ventricular systolic function is normal. The right ventricular  size is normal.  3. The mitral valve is normal in structure. No evidence of mitral valve  regurgitation. No evidence of mitral stenosis.  4. The aortic valve is normal in structure. Aortic valve regurgitation is  not visualized. No aortic stenosis is present.  5. The inferior vena cava is normal in  size with greater than 50%  respiratory variability, suggesting right atrial pressure of 3 mmHg.     Cardiac monitor 06/03/2020  Narrow complex tachycardia noted, 5 episodes. The longest was 6 hours and 51 minutes average heart rate was 175/min. And maximum was 200/min.   Recent Labs: 10/28/2019: NT-Pro BNP 81 06/27/2020: BUN 18; Creatinine, Ser 1.01; Potassium 4.4; Sodium 143  06/27/2020: Chol/HDL Ratio 3.8; Cholesterol, Total 140; HDL 37; LDL Chol Calc (NIH) 86; Triglycerides 89   CrCl cannot be calculated (Patient's most recent lab result is older than the maximum 21 days allowed.).   Wt Readings from Last 3 Encounters:  07/01/20 248 lb 6.4 oz (112.7 kg)  06/27/20 242 lb (109.8 kg)  06/06/20 247 lb (112 kg)     Other studies reviewed: Additional studies/records reviewed today include:  summarized above  ASSESSMENT AND PLAN:  1. PSVT     shoret RP 2. Paroxysmal Afib     CHA2DS2Vasc is 4     On flecainide w/metoprolol/diltiazem     Stable intervals  We revisited the rational for blood thinner in the environment of Afib and stroke risk reduction He can not afford the Eliquis as it is currenlly covered but is agreeable to taking a blood thinner.  He is given a 30day free card and 2 weeks of samples, as well as the information/phine number for [facility] patient assistance to call and get that process started. We discussed blood thinner and bleeding risk  He is very active, including some weight training and "constantly doing something, " I never slow down" Will schedule him for ETT given his flecainide  3. HTN     No changes today   Disposition: Stop ASA, start Eliquis 5mg  BID, F/u with Dr. as scheduled.  He is asked to let Bing Matter know if it looks like he  Current medicines are reviewed at length with the patient today.  The patient did not have any concerns regarding medicines.  [place], PA-C 08/12/2020 4:48 AM     [facility] [address] [place] [phone] (office)  [phone] (fax)

## 2020-08-12 NOTE — Patient Instructions (Addendum)
Medication Instructions:   STOP  TAKING ASPIRIN    START TAKING  ELIQUIS  5 MG  TWICE A DAY   *If you need a refill on your cardiac medications before your next appointment, please call your pharmacy*   Lab Work:NONE ORDERED  TODAY   If you have labs (blood work) drawn today and your tests are completely normal, you will receive your results only by: Marland Kitchen MyChart Message (if you have MyChart) OR . A paper copy in the mail If you have any lab test that is abnormal or we need to change your treatment, we will call you to review the results.   Testing/Procedures: Your physician has requested that you have an exercise tolerance test. For further information please visit [email or web address] Please also follow instruction sheet, as given.   Follow-Up: At [facility], you and your health needs are our priority.  As part of our continuing mission to provide you with exceptional heart care, we have created designated Provider Care Teams.  These Care Teams include your primary Cardiologist (physician) and Advanced Practice Providers (APPs -  Physician Assistants and Nurse Practitioners) who all work together to provide you with the care you need, when you need it.  We recommend signing up for the patient portal called "MyChart".  Sign up information is provided on this After Visit Summary.  MyChart is used to connect with patients for Virtual Visits (Telemedicine).  Patients are able to view lab/test results, encounter notes, upcoming appointments, etc.  Non-urgent messages can be sent to your provider as well.   To learn more about what you can do with MyChart, go to [email or web address].    Your next appointment:  KEEP APPOINTMENT AS SCHEDULED  IN  [place]   Other Instructions

## 2020-08-17 ENCOUNTER — Other Ambulatory Visit: Payer: Self-pay | Admitting: Cardiology

## 2020-08-17 NOTE — Telephone Encounter (Signed)
Refill sent to pharmacy.   

## 2020-08-22 ENCOUNTER — Other Ambulatory Visit: Payer: Self-pay | Admitting: Cardiology

## 2020-08-28 ENCOUNTER — Other Ambulatory Visit: Payer: Self-pay | Admitting: Cardiology

## 2020-08-29 NOTE — Addendum Note (Signed)
Addended by: Oleta Mouse on: 08/29/2020 04:52 PM   Modules accepted: Orders

## 2020-09-01 ENCOUNTER — Other Ambulatory Visit: Payer: Self-pay | Admitting: *Deleted

## 2020-09-01 DIAGNOSIS — Z79899 Other long term (current) drug therapy: Secondary | ICD-10-CM

## 2020-09-01 DIAGNOSIS — Z5181 Encounter for therapeutic drug level monitoring: Secondary | ICD-10-CM

## 2020-09-01 DIAGNOSIS — I471 Supraventricular tachycardia: Secondary | ICD-10-CM

## 2020-09-20 ENCOUNTER — Other Ambulatory Visit: Payer: Self-pay | Admitting: Cardiology

## 2020-09-22 ENCOUNTER — Other Ambulatory Visit: Payer: Self-pay

## 2020-09-22 ENCOUNTER — Telehealth: Payer: Self-pay | Admitting: Radiology

## 2020-09-22 ENCOUNTER — Ambulatory Visit: Payer: Medicare Other

## 2020-09-22 NOTE — Telephone Encounter (Signed)
Patient came to the office today to have a GXT for flecainide start. He was wearing open toed shoes. He also informed me due to bad knees and swollen feet he is unable to walk a treadmill. I have cancelled the GXT order

## 2020-09-26 ENCOUNTER — Other Ambulatory Visit: Payer: Self-pay

## 2020-09-28 ENCOUNTER — Other Ambulatory Visit: Payer: Self-pay

## 2020-09-28 ENCOUNTER — Encounter: Payer: Self-pay | Admitting: Cardiology

## 2020-09-28 ENCOUNTER — Ambulatory Visit: Payer: Medicare Other | Admitting: Cardiology

## 2020-09-28 VITALS — BP 138/90 | HR 72 | Ht 67.0 in | Wt 246.0 lb

## 2020-09-28 DIAGNOSIS — I480 Paroxysmal atrial fibrillation: Secondary | ICD-10-CM | POA: Diagnosis not present

## 2020-09-28 DIAGNOSIS — I2510 Atherosclerotic heart disease of native coronary artery without angina pectoris: Secondary | ICD-10-CM

## 2020-09-28 DIAGNOSIS — E785 Hyperlipidemia, unspecified: Secondary | ICD-10-CM

## 2020-09-28 DIAGNOSIS — I10 Essential (primary) hypertension: Secondary | ICD-10-CM

## 2020-09-28 DIAGNOSIS — I471 Supraventricular tachycardia, unspecified: Secondary | ICD-10-CM

## 2020-09-28 DIAGNOSIS — E118 Type 2 diabetes mellitus with unspecified complications: Secondary | ICD-10-CM

## 2020-09-28 NOTE — Addendum Note (Signed)
Addended by: Hazle Quant on: 09/28/2020 01:51 PM   Modules accepted: Orders

## 2020-09-28 NOTE — Progress Notes (Signed)
Cardiology Office Note:    Date:  09/28/2020   ID:  Tyler Ellis, DOB 08-May-1950, MRN [id number]  PCP:  Forrest Moron, MD  Cardiologist:  Gypsy Balsam, MD    Referring MD: Forrest Moron, MD   Chief Complaint  Patient presents with  . L arm numbness  . bilateral feet swelling    History of Present Illness:    Tyler Ellis is a 70 y.o. male with past medical history significant for essential hypertension, diabetes, coronary artery disease nonobstructive based on cardiac catheterization from 2019, paroxysmal atrial fibrillation, anticoagulated with Eliquis suppressed with flecainide, supraventricular tachycardia on 200 mg of Toprol-XL, Cardizem as well as flecainide.  He is coming today to my office for follow-up.  Overall he is doing well denies have any chest pain tightness squeezing pressure burning chest.  He said medication he takes right now control his arrhythmia and does not have any palpitations.  Concern is swelling of lower extremities which is worse at evening time.  He denies having any paroxysmal nocturnal dyspnea does have some exertional shortness of breath.  Past Medical History:  Diagnosis Date  . Atypical chest pain 10/28/2019  . Coronary artery disease   . Diabetes mellitus (HCC) 02/02/2014  . Diabetes mellitus without complication (HCC)   . Dyslipidemia 03/11/2017  . 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 Enlarged prostate with urinary obstruction 05/11/2020  . Essential hypertension 03/11/2017  . Hypertension   . Hyperthyroidism 06/26/2017  . Medication management 06/06/2020  . Multinodular goiter 07/03/2017  . Nocturia 02/02/2014  . NSTEMI (non-ST elevated myocardial infarction) (HCC) 02/27/2017  . Palpitations 10/28/2019  . Paroxysmal atrial fibrillation (HCC) 05/24/2020  . Supraventricular tachycardia (HCC) 02/14/2018  . Type 2 diabetes mellitus with complication, without long-term current use of insulin (HCC) 03/11/2017    Past Surgical  History:  Procedure Laterality Date  . KNEE SURGERY    . LEFT HEART CATH AND CORONARY ANGIOGRAPHY N/A 02/28/2017   Procedure: LEFT HEART CATH AND CORONARY ANGIOGRAPHY;  Surgeon: Kathleene Hazel, MD;  Location: [facility];  Service: Cardiovascular;  Laterality: N/A;    Current Medications: Current Meds  Medication Sig  . acetaminophen-codeine (TYLENOL #3) 300-30 MG tablet Take 1 tablet every 8 (eight) hours as needed by mouth for moderate pain.   Marland Kitchen ALPRAZolam (XANAX) 0.5 MG tablet Take 0.25-0.5 mg by mouth 3 (three) times daily as needed for anxiety.  Marland Kitchen apixaban (ELIQUIS) 5 MG TABS tablet Take 1 tablet (5 mg total) by mouth 2 (two) times daily.  Marland Kitchen atorvastatin (LIPITOR) 80 MG tablet Take 1 tablet (80 mg total) daily by mouth.  . diltiazem (CARDIZEM CD) 360 MG 24 hr capsule TAKE 1 CAPSULE(360 MG) BY MOUTH DAILY (Patient taking differently: Take 360 mg by mouth daily.)  . ezetimibe (ZETIA) 10 MG tablet TAKE 1 TABLET(10 MG) BY MOUTH DAILY (Patient taking differently: Take 10 mg by mouth daily.)  . flecainide (TAMBOCOR) 50 MG tablet TAKE 1 TABLET BY MOUTH EVERY 12 HOURS (Patient taking differently: Take 50 mg by mouth 2 (two) times daily.)  . glipiZIDE (GLUCOTROL) 10 MG tablet Take 10 mg 2 (two) times daily before a meal by mouth.   Marland Kitchen HUMULIN N KWIKPEN 100 UNIT/ML Kiwkpen Inject 51 Units into the skin daily.  . hydrochlorothiazide (HYDRODIURIL) 25 MG tablet Take 25 mg daily by mouth.  . hydrOXYzine (ATARAX/VISTARIL) 10 MG tablet Take 10 mg by mouth in the morning and at bedtime.  Marland Kitchen losartan (COZAAR) 50 MG tablet  TAKE 1 TABLET(50 MG) BY MOUTH DAILY (Patient taking differently: Take 50 mg by mouth daily.)  . magnesium chloride (SLOW-MAG) 64 MG TBEC SR tablet Take 1 tablet (64 mg total) by mouth 2 (two) times daily.  . meclizine (ANTIVERT) 25 MG tablet Take 25 mg by mouth 3 (three) times daily.  . metFORMIN (GLUCOPHAGE) 1000 MG tablet Take 1,000 mg by mouth 2 (two) times daily.  .  metoprolol (TOPROL-XL) 200 MG 24 hr tablet TAKE 1 TABLET BY MOUTH DAILY. TAKE WITH OR IMMEDIATELY FOLLOWING A MEAL (Patient taking differently: Take 200 mg by mouth daily.)  . tamsulosin (FLOMAX) 0.4 MG CAPS capsule Take 0.4 mg by mouth daily.     Allergies:   Patient has no known allergies.   Social History   Socioeconomic History  . Marital status: Divorced    Spouse name: Not on file  . Number of children: Not on file  . Years of education: Not on file  . Highest education level: Not on file  Occupational History  . Not on file  Tobacco Use  . Smoking status: Never Smoker  . Smokeless tobacco: Never Used  Vaping Use  . Vaping Use: Never used  Substance and Sexual Activity  . Alcohol use: Yes    Comment: weekly  . Drug use: No  . Sexual activity: Not on file  Other Topics Concern  . Not on file  Social History Narrative  . Not on file   Social Determinants of Health   Financial Resource Strain: Not on file  Food Insecurity: Not on file  Transportation Needs: Not on file  Physical Activity: Not on file  Stress: Not on file  Social Connections: Not on file     Family History: The patient's family history includes Hypertension in his father and mother. ROS:   Please see the history of present illness.    All 14 point review of systems negative except as described per history of present illness  EKGs/Labs/Other Studies Reviewed:      Recent Labs: 10/28/2019: NT-Pro BNP 81 06/27/2020: BUN 18; Creatinine, Ser 1.01; Potassium 4.4; Sodium 143  Recent Lipid Panel    Component Value Date/Time   CHOL 140 06/27/2020 1405   TRIG 89 06/27/2020 1405   HDL 37 (L) 06/27/2020 1405   CHOLHDL 3.8 06/27/2020 1405   CHOLHDL 3.7 02/28/2017 0017   VLDL 58 (H) 02/28/2017 0017   LDLCALC 86 06/27/2020 1405    Physical Exam:    VS:  BP 138/90 (BP Location: Right Arm, Patient Position: Sitting)   Pulse 72   Ht 5\' 7"  (1.702 m)   Wt 246 lb (111.6 kg)   SpO2 97%   BMI 38.53  kg/m     Wt Readings from Last 3 Encounters:  09/28/20 246 lb (111.6 kg)  08/12/20 243 lb (110.2 kg)  07/01/20 248 lb 6.4 oz (112.7 kg)     GEN:  Well nourished, well developed in no acute distress HEENT: Normal NECK: No JVD; No carotid bruits LYMPHATICS: No lymphadenopathy CARDIAC: RRR, no murmurs, no rubs, no gallops RESPIRATORY:  Clear to auscultation without rales, wheezing or rhonchi  ABDOMEN: Soft, non-tender, non-distended MUSCULOSKELETAL:  No edema; No deformity  SKIN: Warm and dry LOWER EXTREMITIES: no swelling NEUROLOGIC:  Alert and oriented x 3 PSYCHIATRIC:  Normal affect   ASSESSMENT:    1. Supraventricular tachycardia (HCC)   2. Paroxysmal atrial fibrillation (HCC)   3. Essential hypertension   4. Coronary artery disease involving native coronary artery  of native heart without angina pectoris   5. Type 2 diabetes mellitus with complication, without long-term current use of insulin (HCC)   6. Dyslipidemia    PLAN:    In order of problems listed above:  1. Supraventricular tachycardia seems to be appropriately suppressed with a lot of medications he did see EP team for consideration of a ablation procedure however he is not interested, he prefers medical therapy which we will do for now. 2. Paroxysmal atrial fibrillation denies having any arrhythmia now no palpitations, anticoagulated with Eliquis.  We are working on trying to give him some support to get medications from drug company. 3. Essential hypertension blood pressure seems to be relatively well controlled we will continue present management.  He does have some swelling of lower extremities I will check proBNP as well as Chem-7 today with intention potentially to add some diuretic.  He tells me that he did have some problem before with medication suspect was Lasix if that is the case we will try to do probably torsemide. 4. Type 2 diabetes followed by antimedicine team, I did review his K PN which did not give  me any recent hemoglobin A1c. 5. Dyslipidemia he is taking Zetia as well as Lipitor 80 which is high intense statin last fasting lipid profile have is from March of this year showing LDL of 86 and HDL 37.  We will continue present management.   Medication Adjustments/Labs and Tests Ordered: Current medicines are reviewed at length with the patient today.  Concerns regarding medicines are outlined above.  No orders of the defined types were placed in this encounter.  Medication changes: No orders of the defined types were placed in this encounter.   Signed, Georgeanna Lea, MD, [facility] 09/28/2020 1:45 PM    [facility]

## 2020-09-28 NOTE — Patient Instructions (Signed)
Medication Instructions:  Your physician recommends that you continue on your current medications as directed. Please refer to the Current Medication list given to you today.  *If you need a refill on your cardiac medications before your next appointment, please call your pharmacy*   Lab Work: Your physician recommends that you return for lab work today: bmp, pro bnp  If you have labs (blood work) drawn today and your tests are completely normal, you will receive your results only by: . MyChart Message (if you have MyChart) OR . A paper copy in the mail If you have any lab test that is abnormal or we need to change your treatment, we will call you to review the results.   Testing/Procedures: None.   Follow-Up: At [facility], you and your health needs are our priority.  As part of our continuing mission to provide you with exceptional heart care, we have created designated Provider Care Teams.  These Care Teams include your primary Cardiologist (physician) and Advanced Practice Providers (APPs -  Physician Assistants and Nurse Practitioners) who all work together to provide you with the care you need, when you need it.  We recommend signing up for the patient portal called "MyChart".  Sign up information is provided on this After Visit Summary.  MyChart is used to connect with patients for Virtual Visits (Telemedicine).  Patients are able to view lab/test results, encounter notes, upcoming appointments, etc.  Non-urgent messages can be sent to your provider as well.   To learn more about what you can do with MyChart, go to [email or web address].    Your next appointment:   6 month(s)  The format for your next appointment:   In Person  Provider:   Robert Krasowski, MD   Other Instructions    

## 2020-09-29 ENCOUNTER — Telehealth: Payer: Self-pay | Admitting: Cardiology

## 2020-09-29 LAB — BASIC METABOLIC PANEL
BUN/Creatinine Ratio: 15 (ref 10–24)
BUN: 19 mg/dL (ref 8–27)
CO2: 22 mmol/L (ref 20–29)
Calcium: 10.1 mg/dL (ref 8.6–10.2)
Chloride: 97 mmol/L (ref 96–106)
Creatinine, Ser: 1.26 mg/dL (ref 0.76–1.27)
Glucose: 167 mg/dL — ABNORMAL HIGH (ref 65–99)
Potassium: 4.3 mmol/L (ref 3.5–5.2)
Sodium: 141 mmol/L (ref 134–144)
eGFR: 62 mL/min/{1.73_m2} (ref >59)

## 2020-09-29 LAB — PRO B NATRIURETIC PEPTIDE: NT-Pro BNP: 95 pg/mL (ref 0–376)

## 2020-09-29 NOTE — Telephone Encounter (Signed)
Lm to call back ./cy 

## 2020-09-29 NOTE — Telephone Encounter (Signed)
Follow Up:      Pt is returning call from today.

## 2020-09-29 NOTE — Telephone Encounter (Signed)
Pt c/o swelling: STAT is pt has developed SOB within 24 hours  How much weight have you gained and in what time span? Pt is not sure.  If swelling, where is the swelling located? Bilateral leg/foot swelling  Are you currently taking a fluid pill? No  Are you currently SOB? No because pt can not move around  Do you have a log of your daily weights (if so, list)? No  Have you gained 3 pounds in a day or 5 pounds in a week?   Have you traveled recently? No Pt saw Dr. Kirtland Bouchard yesterday and  was supposed to have been prescribed a fluid pill but he have not gotten it yet

## 2020-09-29 NOTE — Telephone Encounter (Signed)
Per pt both legs are swollen Per office note diuretic to be started after lab findings are available Labs available awaiting Dr Bing Matter to review .Zack Seal

## 2020-10-04 ENCOUNTER — Telehealth: Payer: Self-pay | Admitting: Emergency Medicine

## 2020-10-04 DIAGNOSIS — Z79899 Other long term (current) drug therapy: Secondary | ICD-10-CM

## 2020-10-04 MED ORDER — POTASSIUM CHLORIDE ER 10 MEQ PO TBCR: 10.0000 meq | EXTENDED_RELEASE_TABLET | Freq: Every day | ORAL | 1 refills | Status: DC

## 2020-10-04 MED ORDER — TORSEMIDE 20 MG PO TABS: 20.0000 mg | ORAL_TABLET | Freq: Every day | ORAL | 1 refills | Status: DC

## 2020-10-04 NOTE — Telephone Encounter (Signed)
Called patient. Informed him of results. He will  stop hydrochlorothiazide and start torsemide 20 mg daily and potassium 10 meq daily. No further questions. He will repeat labs next week.

## 2020-10-04 NOTE — Telephone Encounter (Signed)
This has been address in a additional encounter.

## 2020-10-04 NOTE — Telephone Encounter (Signed)
-----   Message from Georgeanna Lea, MD sent at 10/04/2020  8:38 AM EDT ----- Discontinue hydrochlorothiazide, start torsemide 20 mg daily with 10 mg potassium, Chem-7 need to be done next week ----- Message ----- From: Heywood Bene, CMA Sent: 09/30/2020  10:50 AM EDT To: Georgeanna Lea, MD  Regarding message below, can you clarify want you want the patient to d/c and want meds he needs to start on if any? ----- Message ----- From: Georgeanna Lea, MD Sent: 09/29/2020   4:31 PM EDT To: Hazle Quant, RN  Discontinue hydrochlorothiazide please stop torsemide 20 mg daily, 10 mg of potassium, Chem-7 next week

## 2020-10-05 LAB — LAB REPORT - SCANNED
A1c: 7.2
EGFR: 85

## 2020-10-06 ENCOUNTER — Other Ambulatory Visit: Payer: Self-pay | Admitting: Cardiology

## 2020-10-13 LAB — BASIC METABOLIC PANEL
BUN/Creatinine Ratio: 14 (ref 10–24)
BUN: 15 mg/dL (ref 8–27)
CO2: 22 mmol/L (ref 20–29)
Calcium: 10.7 mg/dL — ABNORMAL HIGH (ref 8.6–10.2)
Chloride: 97 mmol/L (ref 96–106)
Creatinine, Ser: 1.08 mg/dL (ref 0.76–1.27)
Glucose: 150 mg/dL — ABNORMAL HIGH (ref 65–99)
Potassium: 4.6 mmol/L (ref 3.5–5.2)
Sodium: 141 mmol/L (ref 134–144)
eGFR: 74 mL/min/{1.73_m2} (ref >59)

## 2020-10-17 NOTE — Telephone Encounter (Signed)
Called patient informed him of lab results.  

## 2020-10-17 NOTE — Telephone Encounter (Signed)
Follow up:    Patient calling to get results. 

## 2020-12-29 ENCOUNTER — Telehealth: Payer: Self-pay

## 2020-12-29 NOTE — Telephone Encounter (Signed)
Patient with diagnosis of afib on Eliquis for anticoagulation.    Procedure: urolift Date of procedure: TBD  CHA2DS2-VASc Score = 4  This indicates a 4.8% annual risk of stroke. The patient's score is based upon: CHF History: 0 HTN History: 1 Diabetes History: 1 Stroke History: 0 Vascular Disease History: 1 Age Score: 1 Gender Score: 0   CrCl 75mL/min using adjusted body weight due to obesity Platelet count 252K  Per office protocol, patient can hold Eliquis for 2-3 days prior to procedure.

## 2020-12-29 NOTE — Telephone Encounter (Signed)
   [facility] Pre-operative Risk Assessment    Request for surgical clearance:  What type of surgery is being performed? Urolift   When is this surgery scheduled? TBD   What type of clearance is required (medical clearance vs. Pharmacy clearance to hold med vs. Both)? Both  Are there any medications that need to be held prior to surgery and how long?Eliquis, holding length not specified   Practice name and name of physician performing surgery? Dr. Lorn Junes at [facility]    What is your office phone number: [phone]    7.   What is your office fax number: [phone]  8.   Anesthesia type (None, local, MAC, general) ? Not specified    Tyler Ellis 12/29/2020, 8:06 AM  _________________________________________________________________   (provider comments below)

## 2020-12-29 NOTE — Telephone Encounter (Signed)
Left VM

## 2021-01-06 LAB — LAB REPORT - SCANNED
A1c: 7.1
EGFR: 93

## 2021-01-10 ENCOUNTER — Telehealth: Payer: Self-pay | Admitting: Cardiology

## 2021-01-10 NOTE — Telephone Encounter (Signed)
-----   Message from Felecia Jan, RN sent at 01/10/2021  4:23 PM EDT ----- Regarding: FW: need appt for sob/doe  ----- Message ----- From: Tarri Fuller, CMA Sent: 01/10/2021   4:16 PM EDT To: Hazle Quant, RN, [facility] Scheduling Subject: need appt for sob/doe                          Hi everyone,   [name], PAC pre op provider today s/w the pt for clearance assessment. In her speaking with the pt, pt states he has been having increased SOB/DOE. Per [name], PAC pt will need to be seen. Pt see's Dr. Bing Matter but I did not see any availability. Can someone please see where the pt can be seen for SOB/DOE and for pre op clearance.  Thank you  Okey Regal

## 2021-01-10 NOTE — Telephone Encounter (Signed)
LVM for pt to call and schedule with Dr. Scotty Court 01/10/21

## 2021-01-10 NOTE — Telephone Encounter (Addendum)
   Name:  Tyler Ellis  DOB:  February 10, 1951  MRN:  [id number]   Primary Cardiologist: Gypsy Balsam, MD  Chart reviewed as part of pre-operative protocol coverage. Patient was contacted 01/10/2021 in reference to pre-operative risk assessment for pending surgery as outlined below.  Tyler Ellis was last seen 09/2020 by Dr. Bing Matter. At that time his medicines were adjusted for edema. Per our phone conversation today he states he's been dealing with increased SOB/DOE lately. Has seen primary care for this, was given some sort of inhaler. He also states the torsemide worked well at first earlier this summer but the fluid came back. No CP.  Therefore, due to new or worsening symptoms, Tyler Ellis will require a follow-up visit for further pre-operative risk assessment.  Pre-op covering staff: - Please schedule appointment and call patient to inform them.  - Please contact requesting surgeon's office via preferred method (i.e, phone, fax) to inform them of need for appointment prior to surgery.  Anticoag recommendation already available below for use at OV.  Laurann Montana, PA-C 01/10/2021, 2:12 PM

## 2021-01-10 NOTE — Telephone Encounter (Signed)
I have reviewed schedules for [place] location where the pt see's Dr. Bing Matter. I do not see any available appts. I will send a message to dr. Vanetta Shawl nurse and the scheduling team to see if they may assist in scheduling the pt. See notes where [name], [facility] pre op provider today has s/w the pt who states he has been having increased SOB/DOE and needs to be seen.

## 2021-01-11 NOTE — Telephone Encounter (Signed)
Appointment has been made

## 2021-01-12 NOTE — Telephone Encounter (Signed)
Pt has been scheduled to see Dr. Mauri Brooklyn, 01/25/2021, and clearance will be addressed at that time.   Will route back to the requesting surgeon's office to make them aware.

## 2021-01-25 ENCOUNTER — Other Ambulatory Visit: Payer: Self-pay

## 2021-01-25 ENCOUNTER — Ambulatory Visit: Payer: Medicare Other | Admitting: Cardiology

## 2021-01-25 ENCOUNTER — Encounter: Payer: Self-pay | Admitting: Cardiology

## 2021-01-25 ENCOUNTER — Other Ambulatory Visit (HOSPITAL_BASED_OUTPATIENT_CLINIC_OR_DEPARTMENT_OTHER): Payer: Self-pay

## 2021-01-25 ENCOUNTER — Ambulatory Visit: Payer: Medicare Other | Attending: Internal Medicine

## 2021-01-25 VITALS — BP 150/86 | HR 85 | Ht 66.5 in | Wt 257.0 lb

## 2021-01-25 DIAGNOSIS — I10 Essential (primary) hypertension: Secondary | ICD-10-CM

## 2021-01-25 DIAGNOSIS — Z23 Encounter for immunization: Secondary | ICD-10-CM

## 2021-01-25 DIAGNOSIS — R002 Palpitations: Secondary | ICD-10-CM

## 2021-01-25 DIAGNOSIS — R0609 Other forms of dyspnea: Secondary | ICD-10-CM

## 2021-01-25 DIAGNOSIS — I480 Paroxysmal atrial fibrillation: Secondary | ICD-10-CM

## 2021-01-25 DIAGNOSIS — I2510 Atherosclerotic heart disease of native coronary artery without angina pectoris: Secondary | ICD-10-CM

## 2021-01-25 DIAGNOSIS — I471 Supraventricular tachycardia: Secondary | ICD-10-CM | POA: Diagnosis not present

## 2021-01-25 MED ORDER — INFLUENZA VAC A&B SA ADJ QUAD 0.5 ML IM PRSY
PREFILLED_SYRINGE | INTRAMUSCULAR | 0 refills | Status: DC
Start: 2021-01-25 — End: 2022-01-02
  Filled 2021-01-25: qty 0.5, 1d supply, fill #0

## 2021-01-25 NOTE — Progress Notes (Signed)
   Covid-19 Vaccination Clinic  Name:  Kiree Dejarnette    MRN: [id number] DOB: 1950/08/05  01/25/2021  Mr. Groot was observed post Covid-19 immunization for 15 minutes without incident. He was provided with Vaccine Information Sheet and instruction to access the V-Safe system.   Mr. Proffit was instructed to call 911 with any severe reactions post vaccine: Difficulty breathing  Swelling of face and throat  A fast heartbeat  A bad rash all over body  Dizziness and weakness

## 2021-01-25 NOTE — Patient Instructions (Signed)
Medication Instructions:  Your physician has recommended you make the following change in your medication:  STOP: Aspirin   START TAKING AGAIN: Eliquis 5 mg twice daily  *If you need a refill on your cardiac medications before your next appointment, please call your pharmacy*   Lab Work: Your physician recommends that you return for lab work today: bmp, pro bnp  If you have labs (blood work) drawn today and your tests are completely normal, you will receive your results only by: MyChart Message (if you have MyChart) OR A paper copy in the mail If you have any lab test that is abnormal or we need to change your treatment, we will call you to review the results.   Testing/Procedures: None   Follow-Up: At [facility], you and your health needs are our priority.  As part of our continuing mission to provide you with exceptional heart care, we have created designated Provider Care Teams.  These Care Teams include your primary Cardiologist (physician) and Advanced Practice Providers (APPs -  Physician Assistants and Nurse Practitioners) who all work together to provide you with the care you need, when you need it.  We recommend signing up for the patient portal called "MyChart".  Sign up information is provided on this After Visit Summary.  MyChart is used to connect with patients for Virtual Visits (Telemedicine).  Patients are able to view lab/test results, encounter notes, upcoming appointments, etc.  Non-urgent messages can be sent to your provider as well.   To learn more about what you can do with MyChart, go to [email or web address].    Your next appointment:   3 month(s)  The format for your next appointment:   In Person  Provider:   Gypsy Balsam, MD   Other Instructions

## 2021-01-25 NOTE — Progress Notes (Signed)
Cardiology Office Note:    Date:  01/25/2021   ID:  Tyler Ellis, DOB 22-Mar-1951, MRN [id number]  PCP:  Forrest Moron, MD  Cardiologist:  Gypsy Balsam, MD    Referring MD: Forrest Moron, MD   Chief Complaint  Patient presents with   Shortness of Breath   Tachycardia    History of Present Illness:    Tyler Ellis is a 70 y.o. male with past medical history significant for paroxysmal atrial fibrillation, anticoagulated with Eliquis however recently Eliquis has been withdrawn and he was put on aspirin by pharmacist secondary to the fact that he could not afford Eliquis, also supraventricular tachycardia, 2019 he had cardiac catheterization which showed nonobstructive disease, essential hypertension.  He comes today to my office for follow-up overall he is not doing well he does have some palpitations again I talked to him about potentially being referred to EP he does not want to do it he said he is fine with this described palpitation lasting for few minutes there is no dizziness no chest pain or shortness of breath with it.  He is upset about the fact he is gaining weight he also complained of having some shortness of breath on the physical exam he does have some swelling of lower extremities.  Concern is about potentially CHF.  He did have echocardiogram done just few months ago which showed preserved ejection fraction.  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   Enlarged prostate with urinary obstruction 05/11/2020   Essential hypertension 03/11/2017   Hypertension    Hyperthyroidism 06/26/2017   Medication management 06/06/2020   Multinodular goiter 07/03/2017   Nocturia 02/02/2014   NSTEMI (non-ST elevated myocardial infarction) (HCC) 02/27/2017    Palpitations 10/28/2019   Paroxysmal atrial fibrillation (HCC) 05/24/2020   Supraventricular tachycardia (HCC) 02/14/2018   Type 2 diabetes mellitus with complication, without long-term current use of insulin (HCC) 03/11/2017    Past Surgical History:  Procedure Laterality Date   KNEE SURGERY     LEFT HEART CATH AND CORONARY ANGIOGRAPHY N/A 02/28/2017   Procedure: LEFT HEART CATH AND CORONARY ANGIOGRAPHY;  Surgeon: Kathleene Hazel, MD;  Location: [facility];  Service: Cardiovascular;  Laterality: N/A;    Current Medications: Current Meds  Medication Sig   acetaminophen-codeine (TYLENOL #3) 300-30 MG tablet Take 1 tablet every 8 (eight) hours as needed by mouth for moderate pain.    ALPRAZolam (XANAX) 0.5 MG tablet Take 0.25-0.5 mg by mouth 3 (three) times daily as needed for anxiety.   aspirin 325 MG tablet Take 325 mg by mouth daily.   atorvastatin (LIPITOR) 80 MG tablet Take 1 tablet (80 mg total) daily by mouth.   diltiazem (CARDIZEM CD) 360 MG 24 hr capsule TAKE 1 CAPSULE(360 MG) BY MOUTH DAILY (Patient taking differently: Take 360 mg by mouth daily.)   esomeprazole (NEXIUM) 20 MG capsule Take 20 mg by mouth daily at 12 noon.   ezetimibe (ZETIA) 10 MG tablet TAKE 1 TABLET(10 MG) BY MOUTH DAILY (Patient taking differently: Take 10 mg by mouth daily.)   flecainide (TAMBOCOR) 50 MG tablet TAKE 1 TABLET BY MOUTH EVERY 12 HOURS (Patient taking differently: Take 50 mg by mouth 2 (two) times daily.)   glipiZIDE (GLUCOTROL) 10 MG tablet Take 10 mg 2 (  two) times daily before a meal by mouth.    HUMULIN N KWIKPEN 100 UNIT/ML Kiwkpen Inject 51 Units into the skin daily.   hydrOXYzine (ATARAX/VISTARIL) 10 MG tablet Take 10 mg by mouth in the morning and at bedtime.   LANTUS SOLOSTAR 100 UNIT/ML Solostar Pen Inject 50 Units into the skin at bedtime.   losartan (COZAAR) 100 MG tablet Take 100 mg by mouth daily.   magnesium chloride (SLOW-MAG) 64 MG TBEC SR tablet Take 1 tablet (64 mg  total) by mouth 2 (two) times daily.   metFORMIN (GLUCOPHAGE) 1000 MG tablet Take 1,000 mg by mouth 2 (two) times daily.   metoprolol (TOPROL-XL) 200 MG 24 hr tablet TAKE 1 TABLET BY MOUTH DAILY. TAKE WITH OR IMMEDIATELY FOLLOWING A MEAL (Patient taking differently: Take 200 mg by mouth daily.)   nitroGLYCERIN (NITROSTAT) 0.4 MG SL tablet Place 1 tablet (0.4 mg total) under the tongue every 5 (five) minutes as needed for chest pain.   potassium chloride (KLOR-CON) 10 MEQ tablet Take 1 tablet (10 mEq total) by mouth daily.   tamsulosin (FLOMAX) 0.4 MG CAPS capsule Take 0.4 mg by mouth daily.   torsemide (DEMADEX) 20 MG tablet Take 1 tablet (20 mg total) by mouth daily.   [DISCONTINUED] losartan (COZAAR) 50 MG tablet TAKE 1 TABLET(50 MG) BY MOUTH DAILY (Patient taking differently: Take 50 mg by mouth daily.)     Allergies:   Patient has no known allergies.   Social History   Socioeconomic History   Marital status: Divorced    Spouse name: Not on file   Number of children: Not on file   Years of education: Not on file   Highest education level: Not on file  Occupational History   Not on file  Tobacco Use   Smoking status: Never   Smokeless tobacco: Never  Vaping Use   Vaping Use: Never used  Substance and Sexual Activity   Alcohol use: Yes    Comment: weekly   Drug use: No   Sexual activity: Not on file  Other Topics Concern   Not on file  Social History Narrative   Not on file   Social Determinants of Health   Financial Resource Strain: Not on file  Food Insecurity: Not on file  Transportation Needs: Not on file  Physical Activity: Not on file  Stress: Not on file  Social Connections: Not on file     Family History: The patient's family history includes Hypertension in his father and mother. ROS:   Please see the history of present illness.    All 14 point review of systems negative except as described per history of present illness  EKGs/Labs/Other Studies  Reviewed:      Recent Labs: 09/28/2020: NT-Pro BNP 95 10/12/2020: BUN 15; Creatinine, Ser 1.08; Potassium 4.6; Sodium 141  Recent Lipid Panel    Component Value Date/Time   CHOL 140 06/27/2020 1405   TRIG 89 06/27/2020 1405   HDL 37 (L) 06/27/2020 1405   CHOLHDL 3.8 06/27/2020 1405   CHOLHDL 3.7 02/28/2017 0017   VLDL 58 (H) 02/28/2017 0017   LDLCALC 86 06/27/2020 1405    Physical Exam:    VS:  BP (!) 150/86 (BP Location: Right Arm, Patient Position: Sitting)   Pulse 85   Ht 5' 6.5" (1.689 m)   Wt 257 lb (116.6 kg)   SpO2 97%   BMI 40.86 kg/m     Wt Readings from Last 3 Encounters:  01/25/21 257 lb (116.6  kg)  09/28/20 246 lb (111.6 kg)  08/12/20 243 lb (110.2 kg)     GEN:  Well nourished, well developed in no acute distress HEENT: Normal NECK: No JVD; No carotid bruits LYMPHATICS: No lymphadenopathy CARDIAC: RRR, no murmurs, no rubs, no gallops RESPIRATORY:  Clear to auscultation without rales, wheezing or rhonchi  ABDOMEN: Soft, non-tender, non-distended MUSCULOSKELETAL:  No edema; No deformity  SKIN: Warm and dry LOWER EXTREMITIES: no swelling NEUROLOGIC:  Alert and oriented x 3 PSYCHIATRIC:  Normal affect   ASSESSMENT:    1. Coronary artery disease involving native coronary artery of native heart without angina pectoris   2. Palpitations   3. Paroxysmal atrial fibrillation (HCC)   4. Supraventricular tachycardia (HCC)   5. Essential hypertension    PLAN:    In order of problems listed above:  Coronary disease only nonobstructive disease based on last cardiac catheterization from 2019 Palpitations his arrhythmia is suppressed with flecainide as well as beta-blocker and Cardizem, I again offered him EP evaluation however he does not want to do it he said he is fine with his palpitation he takes some extra Cardizem and things subsides Paroxysmal atrial fibrillation: He is off Eliquis I will give him samples of Eliquis we will try to get him help with our  referral problem, will stop aspirin, I told him aspirin is not sufficient, if he will not be able to afford Eliquis then we will put him on Coumadin. Essential hypertension elevated today but anticipate need to increase dose of diuretic which should help with that.   Medication Adjustments/Labs and Tests Ordered: Current medicines are reviewed at length with the patient today.  Concerns regarding medicines are outlined above.  No orders of the defined types were placed in this encounter.  Medication changes: No orders of the defined types were placed in this encounter.   Signed, Georgeanna Lea, MD, [facility] 01/25/2021 10:50 AM    [facility]

## 2021-01-26 LAB — BASIC METABOLIC PANEL
BUN/Creatinine Ratio: 11 (ref 10–24)
BUN: 12 mg/dL (ref 8–27)
CO2: 22 mmol/L (ref 20–29)
Calcium: 9.7 mg/dL (ref 8.6–10.2)
Chloride: 101 mmol/L (ref 96–106)
Creatinine, Ser: 1.11 mg/dL (ref 0.76–1.27)
Glucose: 161 mg/dL — ABNORMAL HIGH (ref 70–99)
Potassium: 4.4 mmol/L (ref 3.5–5.2)
Sodium: 141 mmol/L (ref 134–144)
eGFR: 71 mL/min/{1.73_m2} (ref >59)

## 2021-01-26 LAB — PRO B NATRIURETIC PEPTIDE: NT-Pro BNP: 165 pg/mL (ref 0–376)

## 2021-01-30 ENCOUNTER — Telehealth: Payer: Self-pay | Admitting: Cardiology

## 2021-01-30 NOTE — Telephone Encounter (Signed)
Pt is returning call regarding his lab work results. Please advise pt further

## 2021-01-30 NOTE — Telephone Encounter (Signed)
Left message for patient to return call.

## 2021-01-31 NOTE — Telephone Encounter (Signed)
Left message for patient to return call.

## 2021-01-31 NOTE — Telephone Encounter (Signed)
Patient informed of results.  

## 2021-02-03 ENCOUNTER — Other Ambulatory Visit (HOSPITAL_BASED_OUTPATIENT_CLINIC_OR_DEPARTMENT_OTHER): Payer: Self-pay

## 2021-02-03 MED ORDER — COVID-19MRNA BIVAL VACC PFIZER 30 MCG/0.3ML IM SUSP
INTRAMUSCULAR | 0 refills | Status: DC
  Filled 2021-02-03: qty 0.3, 1d supply, fill #0

## 2021-02-18 ENCOUNTER — Other Ambulatory Visit: Payer: Self-pay | Admitting: Cardiology

## 2021-03-21 ENCOUNTER — Other Ambulatory Visit: Payer: Self-pay

## 2021-03-23 ENCOUNTER — Encounter: Payer: Self-pay | Admitting: Cardiology

## 2021-03-23 ENCOUNTER — Other Ambulatory Visit: Payer: Self-pay

## 2021-03-23 ENCOUNTER — Ambulatory Visit: Payer: Medicare Other | Admitting: Cardiology

## 2021-03-23 VITALS — BP 158/92 | HR 103 | Ht 67.0 in | Wt 256.0 lb

## 2021-03-23 DIAGNOSIS — I2510 Atherosclerotic heart disease of native coronary artery without angina pectoris: Secondary | ICD-10-CM | POA: Diagnosis not present

## 2021-03-23 DIAGNOSIS — I480 Paroxysmal atrial fibrillation: Secondary | ICD-10-CM | POA: Diagnosis not present

## 2021-03-23 DIAGNOSIS — R002 Palpitations: Secondary | ICD-10-CM

## 2021-03-23 DIAGNOSIS — E785 Hyperlipidemia, unspecified: Secondary | ICD-10-CM

## 2021-03-23 DIAGNOSIS — I10 Essential (primary) hypertension: Secondary | ICD-10-CM | POA: Diagnosis not present

## 2021-03-23 DIAGNOSIS — I471 Supraventricular tachycardia: Secondary | ICD-10-CM | POA: Diagnosis not present

## 2021-03-23 NOTE — Addendum Note (Signed)
Addended by: Hazle Quant on: 03/23/2021 03:12 PM   Modules accepted: Orders

## 2021-03-23 NOTE — Progress Notes (Signed)
Cardiology Office Note:    Date:  03/23/2021   ID:  Tyler Ellis, DOB May 18, 1950, MRN [id number]  PCP:  Forrest Moron, MD  Cardiologist:  Gypsy Balsam, MD    Referring MD: Forrest Moron, MD   Chief Complaint  Patient presents with   Follow-up  I am doing fine  History of Present Illness:    Tyler Ellis is a 70 y.o. male   with past medical history significant for paroxysmal atrial fibrillation, anticoagulated with Eliquis however recently Eliquis has been withdrawn and he was put on aspirin by pharmacist secondary to the fact that he could not afford Eliquis, also supraventricular tachycardia, 2019 he had cardiac catheterization which showed nonobstructive disease, essential hypertension. He comes today to my office for follow-up, overall he is doing well denies have any chest pain tightness squeezing pressure burning chest.  He does have some palpitations but is a very rare and short lasting previously we will talk about potential referred to EP for more advanced management of his atrial fibrillation but he does not want to do it.  He said he tried to exercise some which make feel better.  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   Enlarged prostate with urinary obstruction 05/11/2020   Essential hypertension 03/11/2017   Hypertension    Hyperthyroidism 06/26/2017   Medication management 06/06/2020   Multinodular goiter 07/03/2017   Nocturia 02/02/2014   NSTEMI (non-ST elevated myocardial infarction) (HCC) 02/27/2017   Palpitations 10/28/2019   Paroxysmal atrial fibrillation (HCC) 05/24/2020   Supraventricular tachycardia (HCC) 02/14/2018   Type 2 diabetes mellitus with complication, without long-term current use of insulin (HCC) 03/11/2017    Past  Surgical History:  Procedure Laterality Date   KNEE SURGERY     LEFT HEART CATH AND CORONARY ANGIOGRAPHY N/A 02/28/2017   Procedure: LEFT HEART CATH AND CORONARY ANGIOGRAPHY;  Surgeon: Kathleene Hazel, MD;  Location: [facility];  Service: Cardiovascular;  Laterality: N/A;    Current Medications: Current Meds  Medication Sig   acetaminophen-codeine (TYLENOL #3) 300-30 MG tablet Take 1 tablet every 8 (eight) hours as needed by mouth for moderate pain.    ALPRAZolam (XANAX) 0.5 MG tablet Take 0.25-0.5 mg by mouth 3 (three) times daily as needed for anxiety.   apixaban (ELIQUIS) 5 MG TABS tablet Take 1 tablet (5 mg total) by mouth 2 (two) times daily.   atorvastatin (LIPITOR) 80 MG tablet Take 1 tablet (80 mg total) daily by mouth.   COVID-19 mRNA bivalent vaccine, Pfizer, injection Inject into the muscle. (Patient taking differently: Inject 0.3 mLs into the muscle once.)   diltiazem (CARDIZEM CD) 360 MG 24 hr capsule TAKE 1 CAPSULE(360 MG) BY MOUTH DAILY (Patient taking differently: Take 360 mg by mouth daily.)   esomeprazole (NEXIUM) 20 MG capsule Take 20 mg by mouth daily at 12 noon.   ezetimibe (ZETIA) 10 MG tablet TAKE 1 TABLET(10 MG) BY MOUTH DAILY (Patient taking differently: Take 10 mg by mouth daily.)   flecainide (TAMBOCOR) 50 MG tablet TAKE 1 TABLET BY MOUTH EVERY 12 HOURS (Patient taking differently: Take 50 mg by mouth 2 (two) times daily.)   glipiZIDE (GLUCOTROL) 10 MG tablet Take 10 mg 2 (two) times daily before a meal by mouth.    HUMULIN N  KWIKPEN 100 UNIT/ML Kiwkpen Inject 51 Units into the skin daily.   hydrOXYzine (ATARAX/VISTARIL) 10 MG tablet Take 10 mg by mouth in the morning and at bedtime.   influenza vaccine adjuvanted (FLUAD) 0.5 ML injection Inject into the muscle. (Patient taking differently: Inject 0.5 mLs into the muscle tomorrow at 10 am.)   LANTUS SOLOSTAR 100 UNIT/ML Solostar Pen Inject 50 Units into the skin at bedtime.   losartan (COZAAR) 100 MG  tablet Take 100 mg by mouth daily.   losartan (COZAAR) 50 MG tablet Take 50 mg by mouth daily.   magnesium chloride (SLOW-MAG) 64 MG TBEC SR tablet Take 1 tablet (64 mg total) by mouth 2 (two) times daily.   metFORMIN (GLUCOPHAGE) 1000 MG tablet Take 1,000 mg by mouth 2 (two) times daily.   metoprolol (TOPROL-XL) 200 MG 24 hr tablet TAKE 1 TABLET BY MOUTH DAILY. TAKE WITH OR IMMEDIATELY FOLLOWING A MEAL (Patient taking differently: Take 200 mg by mouth daily.)   nitroGLYCERIN (NITROSTAT) 0.4 MG SL tablet Place 1 tablet (0.4 mg total) under the tongue every 5 (five) minutes as needed for chest pain.   potassium chloride (KLOR-CON) 10 MEQ tablet Take 1 tablet (10 mEq total) by mouth daily.   tamsulosin (FLOMAX) 0.4 MG CAPS capsule Take 0.4 mg by mouth daily.   torsemide (DEMADEX) 20 MG tablet Take 1 tablet (20 mg total) by mouth daily.     Allergies:   Patient has no known allergies.   Social History   Socioeconomic History   Marital status: Divorced    Spouse name: Not on file   Number of children: Not on file   Years of education: Not on file   Highest education level: Not on file  Occupational History   Not on file  Tobacco Use   Smoking status: Never   Smokeless tobacco: Never  Vaping Use   Vaping Use: Never used  Substance and Sexual Activity   Alcohol use: Yes    Comment: weekly   Drug use: No   Sexual activity: Not on file  Other Topics Concern   Not on file  Social History Narrative   Not on file   Social Determinants of Health   Financial Resource Strain: Not on file  Food Insecurity: Not on file  Transportation Needs: Not on file  Physical Activity: Not on file  Stress: Not on file  Social Connections: Not on file     Family History: The patient's family history includes Hypertension in his father and mother. ROS:   Please see the history of present illness.    All 14 point review of systems negative except as described per history of present  illness  EKGs/Labs/Other Studies Reviewed:      Recent Labs: 01/25/2021: BUN 12; Creatinine, Ser 1.11; NT-Pro BNP 165; Potassium 4.4; Sodium 141  Recent Lipid Panel    Component Value Date/Time   CHOL 140 06/27/2020 1405   TRIG 89 06/27/2020 1405   HDL 37 (L) 06/27/2020 1405   CHOLHDL 3.8 06/27/2020 1405   CHOLHDL 3.7 02/28/2017 0017   VLDL 58 (H) 02/28/2017 0017   LDLCALC 86 06/27/2020 1405    Physical Exam:    VS:  BP (!) 158/92 (BP Location: Right Arm, Patient Position: Sitting)   Pulse (!) 103   Ht 5\' 7"  (1.702 m)   Wt 256 lb (116.1 kg)   SpO2 95%   BMI 40.10 kg/m     Wt Readings from Last 3 Encounters:  03/23/21 256  lb (116.1 kg)  01/25/21 257 lb (116.6 kg)  09/28/20 246 lb (111.6 kg)     GEN:  Well nourished, well developed in no acute distress HEENT: Normal NECK: No JVD; No carotid bruits LYMPHATICS: No lymphadenopathy CARDIAC: RRR, no murmurs, no rubs, no gallops RESPIRATORY:  Clear to auscultation without rales, wheezing or rhonchi  ABDOMEN: Soft, non-tender, non-distended MUSCULOSKELETAL:  No edema; No deformity  SKIN: Warm and dry LOWER EXTREMITIES: no swelling NEUROLOGIC:  Alert and oriented x 3 PSYCHIATRIC:  Normal affect   ASSESSMENT:    1. Coronary artery disease involving native coronary artery of native heart without angina pectoris   2. Paroxysmal atrial fibrillation (HCC)   3. Supraventricular tachycardia (HCC)   4. Essential hypertension   5. Dyslipidemia   6. Palpitations    PLAN:    In order of problems listed above:  Coronary disease stable from that point review, on appropriate medication which include lipid-lowering agents. Dyslipidemia I did review K PN from March of this year which show HDL of 37 LDL 86, will check his fasting lipid profile today.  He is on high intense statin in form of Lipitor 80 as well as Zetia. Paroxysmal atrial fibrillation seems to be doing well from that point review.  Denies having any chest pain  tightness squeezing pressure burning chest, rare palpitations.  He is on flecainide and anticoagulation which I will continue Supraventricular tachycardia rare occurrences of short lasting palpitations.  He is perfectly fine with it does not want to do anything about Essential hypertension elevated today in the office but he tells me it is always good at home.  We will continue monitoring.  We did talk about avoidance of salty foods and I given some instructions how to do it, we did talk about need to exercise and regular basis which she is trying to do.   Medication Adjustments/Labs and Tests Ordered: Current medicines are reviewed at length with the patient today.  Concerns regarding medicines are outlined above.  No orders of the defined types were placed in this encounter.  Medication changes: No orders of the defined types were placed in this encounter.   Signed, Georgeanna Lea, MD, [facility] 03/23/2021 3:04 PM    [facility]

## 2021-03-23 NOTE — Patient Instructions (Signed)
Medication Instructions:  Your physician recommends that you continue on your current medications as directed. Please refer to the Current Medication list given to you today.  *If you need a refill on your cardiac medications before your next appointment, please call your pharmacy*   Lab Work: Your physician recommends that you return for lab work today: lipid, lft  If you have labs (blood work) drawn today and your tests are completely normal, you will receive your results only by: MyChart Message (if you have MyChart) OR A paper copy in the mail If you have any lab test that is abnormal or we need to change your treatment, we will call you to review the results.   Testing/Procedures: None   Follow-Up: At [facility], you and your health needs are our priority.  As part of our continuing mission to provide you with exceptional heart care, we have created designated Provider Care Teams.  These Care Teams include your primary Cardiologist (physician) and Advanced Practice Providers (APPs -  Physician Assistants and Nurse Practitioners) who all work together to provide you with the care you need, when you need it.  We recommend signing up for the patient portal called "MyChart".  Sign up information is provided on this After Visit Summary.  MyChart is used to connect with patients for Virtual Visits (Telemedicine).  Patients are able to view lab/test results, encounter notes, upcoming appointments, etc.  Non-urgent messages can be sent to your provider as well.   To learn more about what you can do with MyChart, go to [email or web address].    Your next appointment:   5 month(s)  The format for your next appointment:   In Person  Provider:   Gypsy Balsam, MD    Other Instructions

## 2021-03-24 LAB — LIPID PANEL
Chol/HDL Ratio: 2.7 ratio (ref 0.0–5.0)
Cholesterol, Total: 118 mg/dL (ref 100–199)
HDL: 43 mg/dL
LDL Chol Calc (NIH): 63 mg/dL (ref 0–99)
Triglycerides: 54 mg/dL (ref 0–149)
VLDL Cholesterol Cal: 12 mg/dL (ref 5–40)

## 2021-03-29 ENCOUNTER — Telehealth: Payer: Self-pay

## 2021-03-29 NOTE — Telephone Encounter (Signed)
Patient was returning call. Please advise ?

## 2021-03-29 NOTE — Telephone Encounter (Signed)
-----   Message from Georgeanna Lea, MD sent at 03/27/2021  8:38 AM EST ----- Cholesterol looks perfect, continue present management

## 2021-03-29 NOTE — Telephone Encounter (Signed)
LM to return call and sent a letter as last attempt to reach out to him

## 2021-03-29 NOTE — Telephone Encounter (Signed)
Left message for patient to return call.

## 2021-04-06 NOTE — Telephone Encounter (Signed)
Spoke to patient informed him of cholesterol results.

## 2021-04-20 ENCOUNTER — Other Ambulatory Visit: Payer: Self-pay | Admitting: Cardiology

## 2021-05-07 ENCOUNTER — Other Ambulatory Visit: Payer: Self-pay | Admitting: Cardiology

## 2021-05-11 ENCOUNTER — Ambulatory Visit: Payer: Medicare Other | Admitting: Cardiology

## 2021-05-17 ENCOUNTER — Other Ambulatory Visit: Payer: Self-pay | Admitting: Cardiology

## 2021-05-24 LAB — LAB REPORT - SCANNED
A1c: 8.6
EGFR: 45

## 2021-07-22 ENCOUNTER — Other Ambulatory Visit: Payer: Self-pay | Admitting: Cardiology

## 2021-08-01 ENCOUNTER — Other Ambulatory Visit: Payer: Self-pay

## 2021-08-01 NOTE — Patient Outreach (Signed)
Aging Gracefully Program ? ?08/01/2021 ? ?[name] ?[birth year] ?[phone] ? ? ?[facility] Evaluation Interviewer made contact with patient. Aging Gracefully initial and survey completed.  ? ?Interviewer will send referral to RN and OT for follow up. ? ?Baruch Gouty ?Care Management Assistant  ?([phone] ?

## 2021-08-02 ENCOUNTER — Other Ambulatory Visit: Payer: Self-pay | Admitting: Cardiology

## 2021-08-19 ENCOUNTER — Other Ambulatory Visit: Payer: Self-pay | Admitting: Specialist

## 2021-08-20 NOTE — Patient Outreach (Signed)
Aging Gracefully Program ? ?OT Initial Visit ? ?08/20/2021 ? ?Tyler Ellis ?12/18/1950 ?[id number] ? ?Visit:  1- Initial Visit ? ?Start Time:  53 ?End Time:  1100 ?Total Minutes:  70 ? ?CCAP: ?Typical Daily Routine: ?Typical Daily Routine:: independent, enjoys cleaning his home, talking with his family ?What Types Of Care Problems Are You Having Throughout The Day?: balance when putting pants on, balance and short winded in the shower ?What Kind Of Help Do You Receive?: n/a ?Do You Think You Need Other Types Of Help?: no ?What Do You Think Would Make Everyday Life Easier For You?: modifications in the shower ?What Is A Good Day Like?: dont get fatigued ?What Is A Bad Day Like?: get fatigued or short winded ?Do You Have Time For Yourself?: yes ?Patient Reported Equipment: ?Patient Reported Equipment Currently Used: Single [employer] ?Functional Mobility-Walking Indoors/Getting Around the House: ?  ?Functional Mobility-Walk A Block: ?Walk A Block: A Little Difficulty ?Do You:: Use A Device ?Importance Of Learning New Strategies:: Not At All ?Functional Mobility-Maintain Balance While Showering: ?Maintaining Balance While Showering: A Little Difficulty ?Do You:: No Device/No Assistance ?Importance Of Learning New Strategies:: Very Much ?Other Comments:: goal ?Functional Mobility-Stooping, Crouching, Kneeling To Retreive Item: ?Stooping, Crouching, or Kneeling To Retrieve Item: No Difficulty ?Functional Mobility-Bending From Standing Position To Pick Up Clothing Off The Floor: ?Bending Over From Standing Position To Pick Up Clothing Off The Floor: No Difficulty ?Functional Mobility-Reaching For Items Above Shoulder Level: ?Reaching For Items Above Shoulder Level: No Difficulty ?Functional Mobility-Climb 1 Flight Of Stairs: ?Climb 1 Flight Of Stairs: N/A ?Functional Mobility-Move In And Out Of Chair: ?Move In and Out Of A Chair: A Little Difficulty ?Do You:: No Device/No Assistance ?Importance Of Learning New Strategies::  A Little ?Other Comments:: chairs are very low ?Observation: Move In And Out Of Chair: Independent With Pain, Difficulty, Or Use Of Device ?Safety: No Risk ?Efficiency: Very ?Intervention: No ?Functional Mobility-Move In And Out Of Bed: ?Move In and Out Of Bed: No Difficulty ?Functional Mobility-Move In And Out Of Bath/Shower: ?Move In And Out Of A Bath/Shower: A Little Difficulty ?Do You:: No Device/No Assistance ?Importance Of Learning New Strategies:: Very Much ?Other Comments:: goal ?Functional Mobility-Get On And Off Toilet: ?Getting Up From The Floor: N/A ?Functional Mobility-Into And Out Of Car, Not Including Driving: ?Into  And Out Of Car, Not Including Driving: No Difficulty ?Functional Mobility-Other Mobility Difficulty: ?  ? ? ? ?Activities of Daily Living-Bathing/Showering: ?ADL-Bathing/Showering: A Little Difficulty ?Do You:: No Device/No Assistance ?Importance Of Learning New Strategies: A Little ?Other Comments:: gets winded when standing for too long - goal ?Activities of Daily Living-Personal Hygiene and Grooming: ?Personal Hygiene and Grooming: A Little Difficulty ?Do You:: No Device/No Assistance ?Importance Of Learning New Strategies: A Little ?Other Comments:: has to lean over on the sink because it is so low - goal ?Activities of Daily Living-Toilet Hygiene: ?Toilet Hygiene: No Difficulty ?Activities of Daily Living-Put On And Take Off Undergarments (Incl. Fasteners): ?Put On And Take Off Undergarments (Incl. Fasteners): A Little Difficulty ?Do You:: No Device/No Assistance ?Importance Of Learning New Strategies: Not At All ?Activities of Daily Living-Put On And Take Off Shirt/Dress/Coat (Incl. Fasteners): ?Put On And Take Off Shirt/Dress/Coat (Incl. Fasteners): No Difficulty ?Activities of Daily Living-Put On And Take Off Socks And Shoes: ?Put On And Take Off Socks And  Shoes: No Difficulty ?Activities of Daily Living-Feed Self: ?  ?Activities of Daily Living-Rest And Sleep: ?Rest and Sleep:  No Difficulty ?Activities of Daily Living-Sexual  Activity: ?Sexual  Activity: N/A ?Activities of Daily Living-Other Activity Identified: ?  ? ?Instrumental Activities of Daily Living-Light Homemaking (Laundry, Straightening Up, Vacuuming):  ?Do Light Homemaking (Laundry, Straightening Up, Vacuuming): No Difficulty ?Instrumental Activities of Daily Living-Making A Bed: ?Making a Bed: No Difficulty ?Instrumental Activities of Daily Living-Washing Dishes By Hand While Standing At The Sink: ?Washing Dishes By Hand While Standing At The Sink: No Difficulty ?Instrumental Activities of Daily Living-Grocery Shopping: ?Do Grocery Shopping: No Difficulty ?Instrumental Activities of Daily Living-Use Telephone: ?Use Telephone: No Difficulty ?Instrumental Activities of Daily Living-Financial Management: ?Financial Management: No Difficulty ?Instrumental Activities of Daily Living-Medications: ?Take Medications: No Difficulty ?Instrumental Activities of Daily Living-Health Management And Maintenance: ?Health Management & Maintenance: No Difficulty ?Instrumental Activities of Daily Living-Meal Preparation and Clean-Up: ?Meal Preparation and Clean-Up: No Difficulty ?Instrumental Activities of Daily Living-Provide Care For Others/Pets: ?Care For Others/Pets: N/A ?Instrumental Activities of Daily Living-Take Part In Organized Social Activities: ?Take Part In Organized Social Activities: N/A ?Instrumental Activities of Daily Living-Leisure Participation: ?Leisure Participation: N/A ?Instrumental Activities of Daily Living-Employment/Volunteer Activities: ?Employment/Volunteer Activities: N/A ?Instrumental Activities of Daily Living-Other Identifies: ?  ? ?Readiness To Change Score:  Readiness to Change Score: 8.67 ? ?Home Environment Assessment: ?Outside Home Entry:: n/a ?Dining Room:: n/a ?Living Room:: n/a ?Kitchen:: n/a ?Bathroom:: hall - commode low and vanity low would benefit from a cut out tub or a tub to shower conversion.   master bath commode low and vanity low ?Master Bedroom:: n/a ?Laundry:: n/a ? ?Durable Medical Equipment: ?  ? ?Patient Education: ?Education Provided: Yes ?Education Details: provided booklet on how to stay safe in his home ?Person(s) Educated: Patient ?Comprehension: Verbalized Understanding ? ?Goals: ? Goals Addressed   ? ?  ?  ?  ?  ? This Visit's Progress  ?  Patient Stated     ?  Patient will improve safety while bathing. ?  ?  Patient Stated     ?  Patient will improve safety while getting in and out of the shower. ?  ?  Patient Stated     ?  Patient will improve safety getting on and off of the commode.  ?  ?  Patient Stated     ?  Patient will improve balance while completing grooming tasks at the bathroom sink. ?  ? ?  ? ? ?Post Clinical Reasoning: ?Clinician View Of Client Situation:: Patient is doing very well - he is able to complete all B/IADLs on his own, mow his yard and drive.  he does recognize that bathroom safety is his largest opportunity for improvement and is very motivated to learn new techniques to improve and continue his safety. ?Client View Of His/Her Situation:: doing well, recognizes it takes him a bit longer to do tasks than it used to ?Next Visit Plan:: problem solve goal 1 shower safety and educate patient on how to safely get up from a fall. ?Shirlean Mylar, MHA, OTR/L ?[phone] ? ? ? ? ?

## 2021-08-21 LAB — HM HEPATITIS C SCREENING LAB: HM Hepatitis Screen: NEGATIVE

## 2021-08-23 LAB — LAB REPORT - SCANNED
A1c: 8.4
EGFR: 78

## 2021-09-01 ENCOUNTER — Other Ambulatory Visit: Payer: Self-pay | Admitting: *Deleted

## 2021-09-01 ENCOUNTER — Encounter: Payer: Self-pay | Admitting: *Deleted

## 2021-09-01 NOTE — Patient Instructions (Signed)
Goals Addressed   ? ?  ?  ?  ?  ? This Visit's Progress  ?  Aging Gracefully RN Goal - Monitor and Manage My Blood Sugar-Diabetes Type 2     ?  Timeframe:  Long-Range Goal ?Priority:  High ?Start Date:        09/01/21                     ?Expected End Date:                      ? ?Follow Up Date 10/04/2021  ?  ?- check blood sugar if I feel it is too high or too low ?- take the blood sugar meter to all doctor visits  ?  ?Why is this important?   ?Checking your blood sugar at home helps to keep it from getting very high or very low.  ?Writing the results in a diary or log helps the doctor know how to care for you.  ?Your blood sugar log should have the time, the date and the results.  ?Also, write down the amount of insulin or other medicine you take.  ?Other information like what you ate, exercise done and how you were feeling will also be helpful..   ?  ?Notes:  Client states he is monitoring CBG twice daily.  Education given to monitor blood glucose when he feels signs and symptoms of hyperglycemia and hypoglycemia and manage diet to decrease sugary drinks and simple sugar snacks.  Reviewed signs and symptoms of hyper/hypoglycemia.  Share calendar, notebook and exercises at next visit.   ?  ?  Aging Clinical cytogeneticist Goal - Track and Manage My Blood Pressure-Hypertension     ?  Timeframe:  Long-Range Goal ?Priority:  High ?Start Date: 09/01/21                            ?Expected End Date:   12/02/21                  ? ?Follow Up Date 10/04/21  ?  ?- choose a place to take my blood pressure (home, clinic or office, retail store) ?- write blood pressure results in a log or diary  ?  ?Why is this important?   ?You won't feel high blood pressure, but it can still hurt your blood vessels.  ?High blood pressure can cause heart or kidney problems. It can also cause a stroke.  ?Making lifestyle changes like losing a little weight or eating less salt will help.  ?Checking your blood pressure at home and at different times of  the day can help to control blood pressure.  ?If the doctor prescribes medicine remember to take it the way the doctor ordered.  ?Call the office if you cannot afford the medicine or if there are questions about it.   ?  ?Notes:  Client states his blood pressure has been elevated and he needs to get it under control.  Education given around diet and exercise.  Client states he doesn't understand how to read labels.  Sodium is sometimes an issue.  He does rinse can vegetables but then puts cured meat in the vegetables to cook.  States he didn't realize he was adding the sodium back to his food.  Sending sodium education sheet.   ?  ? ?  ?  ?  ?

## 2021-09-01 NOTE — Patient Outreach (Signed)
Aging Gracefully Program ? ?RN Visit ? ?09/01/2021 ? ?Salem Senate ?October 17, 1950 ?[id number] ? ?Visit:  Initial RN Visit  ? ?Start Time:  0930  ?End Time:  1130  ?Total Minutes:  120  ? ?Readiness To Change Score:  Readiness to Change Score: 10 ? ?Universal RN Interventions: ?Calendar Distribution:  (Distribute Calendar tool at next visit) ?Exercise Review:  (Review Exercises with client at next visit) ?Medications:  (Client states medications are taken as prescribed) ?Medication Changes: No ?Pain: No ?PCP Advocacy/Support: Yes ?Fall Prevention: No (Last fall 2 years ago due to wrong shoes) ?Incontinence:  (Client denies incontinence) ?Clinician View Of Client Situation: Client is dressed and awaiting visit with this care manager.  Client is open and prepared to accept new education to help facilitate management with his current health conditions. ?Client View Of His/Her Situation: Client feels he needs help with blood pressure and diabetes.  Thinks exercise and diet needs to monitor. ? ?Healthcare Provider Communication: ?Did Higher education careers adviser With [employer] Provider?: No ?According to Client, Did PCP Report Communication With An Aging Gracefully RN?: No ? ?Clinician View of Client Situation: ?Clinician View Of Client Situation: Client is dressed and awaiting visit with this care manager.  Client is open and prepared to accept new education to help facilitate management with his current health conditions. ?Client's View of His/Her Situation: ?Ambulance person Of His/Her Situation: Client feels he needs help with blood pressure and diabetes.  Thinks exercise and diet needs to monitor. ? ?Medication Assessment: ?Do You Have Any Problems Paying For Medications?: Yes ([place] cost $700/month and haven't been able to get any help.  Working with MD to get help.  MD has been giving samples until this is done.) ?Where Does Client Store Medications?: Other: (Bedroom) ?Can Client Read Pill Bottles?: Yes ?Does Client Use A  Pillbox?: No ?Does Anyone Assist Client In Taking Medications?: No ? ?OT Update: N/A ? ?Session Summary: Met with client in his home.  Client reviewed all health conditions with this care manager but would like to focus primarily on his hypertension and blood sugar in future visits.  States he is ready to make positive changes to improve his health.  Client states he would like to decrease A1C and decrease hypertension.  Education provided during today's visit.  See care plan for additional updates.   ? ? ?

## 2021-09-05 ENCOUNTER — Other Ambulatory Visit: Payer: Self-pay | Admitting: Cardiology

## 2021-09-14 ENCOUNTER — Encounter: Payer: Medicare Other | Admitting: Specialist

## 2021-09-14 ENCOUNTER — Ambulatory Visit: Payer: Medicare Other | Admitting: Cardiology

## 2021-09-19 ENCOUNTER — Telehealth: Payer: Self-pay | Admitting: *Deleted

## 2021-09-22 ENCOUNTER — Other Ambulatory Visit: Payer: Self-pay | Admitting: Specialist

## 2021-09-25 ENCOUNTER — Encounter (HOSPITAL_BASED_OUTPATIENT_CLINIC_OR_DEPARTMENT_OTHER): Payer: Self-pay | Admitting: Pediatrics

## 2021-09-25 ENCOUNTER — Emergency Department (HOSPITAL_BASED_OUTPATIENT_CLINIC_OR_DEPARTMENT_OTHER): Payer: Medicare Other

## 2021-09-25 ENCOUNTER — Emergency Department (HOSPITAL_BASED_OUTPATIENT_CLINIC_OR_DEPARTMENT_OTHER)
Admission: EM | Admit: 2021-09-25 | Discharge: 2021-09-25 | Disposition: A | Discharge status: Home / Self Care | Payer: Medicare Other | Attending: Emergency Medicine | Admitting: Emergency Medicine

## 2021-09-25 ENCOUNTER — Other Ambulatory Visit: Payer: Self-pay

## 2021-09-25 DIAGNOSIS — I2510 Atherosclerotic heart disease of native coronary artery without angina pectoris: Secondary | ICD-10-CM | POA: Diagnosis not present

## 2021-09-25 DIAGNOSIS — R072 Precordial pain: Secondary | ICD-10-CM | POA: Insufficient documentation

## 2021-09-25 DIAGNOSIS — R350 Frequency of micturition: Secondary | ICD-10-CM | POA: Diagnosis not present

## 2021-09-25 DIAGNOSIS — R3915 Urgency of urination: Secondary | ICD-10-CM | POA: Insufficient documentation

## 2021-09-25 DIAGNOSIS — I10 Essential (primary) hypertension: Secondary | ICD-10-CM | POA: Diagnosis not present

## 2021-09-25 DIAGNOSIS — R059 Cough, unspecified: Secondary | ICD-10-CM | POA: Insufficient documentation

## 2021-09-25 DIAGNOSIS — R0789 Other chest pain: Secondary | ICD-10-CM | POA: Diagnosis present

## 2021-09-25 DIAGNOSIS — E119 Type 2 diabetes mellitus without complications: Secondary | ICD-10-CM | POA: Diagnosis not present

## 2021-09-25 DIAGNOSIS — R61 Generalized hyperhidrosis: Secondary | ICD-10-CM | POA: Diagnosis not present

## 2021-09-25 LAB — URINALYSIS, ROUTINE W REFLEX MICROSCOPIC
Bilirubin Urine: NEGATIVE
Glucose, UA: 100 mg/dL — AB
Ketones, ur: NEGATIVE mg/dL
Leukocytes,Ua: NEGATIVE
Nitrite: NEGATIVE
Protein, ur: 100 mg/dL — AB
Specific Gravity, Urine: 1.02 (ref 1.005–1.030)
pH: 5 (ref 5.0–8.0)

## 2021-09-25 LAB — LIPASE, BLOOD: Lipase: 35 U/L (ref 11–51)

## 2021-09-25 LAB — BASIC METABOLIC PANEL
Anion gap: 10 (ref 5–15)
BUN: 16 mg/dL (ref 8–23)
CO2: 26 mmol/L (ref 22–32)
Calcium: 9.5 mg/dL (ref 8.9–10.3)
Chloride: 102 mmol/L (ref 98–111)
Creatinine, Ser: 1.02 mg/dL (ref 0.61–1.24)
GFR, Estimated: >60 mL/min (ref >60)
Glucose, Bld: 211 mg/dL — ABNORMAL HIGH (ref 70–99)
Potassium: 4.1 mmol/L (ref 3.5–5.1)
Sodium: 138 mmol/L (ref 135–145)

## 2021-09-25 LAB — HEPATIC FUNCTION PANEL
ALT: 23 U/L (ref 0–44)
AST: 21 U/L (ref 15–41)
Albumin: 4.1 g/dL (ref 3.5–5.0)
Alkaline Phosphatase: 99 U/L (ref 38–126)
Bilirubin, Direct: 0.1 mg/dL (ref 0.0–0.2)
Indirect Bilirubin: 0.4 mg/dL (ref 0.3–0.9)
Total Bilirubin: 0.5 mg/dL (ref 0.3–1.2)
Total Protein: 7.4 g/dL (ref 6.5–8.1)

## 2021-09-25 LAB — URINALYSIS, MICROSCOPIC (REFLEX): WBC, UA: NONE SEEN WBC/hpf (ref 0–5)

## 2021-09-25 LAB — CBC
HCT: 43.6 % (ref 39.0–52.0)
Hemoglobin: 14.4 g/dL (ref 13.0–17.0)
MCH: 31.9 pg (ref 26.0–34.0)
MCHC: 33 g/dL (ref 30.0–36.0)
MCV: 96.5 fL (ref 80.0–100.0)
Platelets: 211 10*3/uL (ref 150–400)
RBC: 4.52 MIL/uL (ref 4.22–5.81)
RDW: 13.4 % (ref 11.5–15.5)
WBC: 6.7 10*3/uL (ref 4.0–10.5)
nRBC: 0 % (ref 0.0–0.2)

## 2021-09-25 LAB — TROPONIN I (HIGH SENSITIVITY)
Troponin I (High Sensitivity): 17 ng/L (ref <18)
Troponin I (High Sensitivity): 17 ng/L (ref <18)

## 2021-09-25 MED ORDER — AZITHROMYCIN 250 MG PO TABS: 250.0000 mg | ORAL_TABLET | Freq: Every day | ORAL | 0 refills | Status: DC

## 2021-09-25 NOTE — Patient Outreach (Signed)
Aging Gracefully Program  OT Follow-Up Visit  09/25/2021  Tyler Ellis 03-28-1951 [id number]  Visit:  2- Second Visit  Start Time:  1515 End Time:  1600 Total Minutes:  45   e   Durable Medical Equipment: Durable Medical Equipment: Shower Chair With Back Durable Medical Equipment Distribution Date: 09/22/21  Patient Education: Education Provided: Yes Education Details: educated on how to safely get up from a fall Person(s) Educated: Patient Comprehension: Verbalized Understanding  Goals:   Goals Addressed             This Visit's Progress    Patient Stated   On track    Patient will improve safety while bathing. ACTION PLANNING - BATHING Target Problem Area: unsteady while showering    Why Problem May Occur:  Stays in hot water too long Breathing is compromised by steam Pain in back and legs when standing for too long    Target Goal: increased steadiness and safety in shower    STRATEGIES Saving Your Energy: DO: DON'T:  Use a tub bench/seat Stand while bathing, it uses more energy  Use appropriate adaptive equipment:  long handled sponge, soap on a rope Rush  Keep all items you'll need within easy reach   Other   Other   Modifying your home environment and making it safe: DO: DON'T:  Install grab bars n the shower and next to the toilet   Place a rubber mat along the entire length of the tub Place loose rugs in the bathroom- they can trip you or your walker/cane can get caught on them  Make sure the bathroom is well lit    Install a hand held shower head   Simplifying the way you set up tasks or daily routines: DO: DON'T:  Plan to bathe/shower before you're overly tired [employer] through [employer] all items before getting started   Other   Other   Other    PRACTICE It is important to practice the strategies so we can determine if they will be effective in helping to reach your goal. Follow these specific recommendations:  Use shower  seat when bathing  2. 3.  If a strategy does not work the first time, try it again and again (and maybe again). We may make some changes over the next few sessions, based on how they work.           Post Clinical Reasoning: Client Action (Goal) One Interventions: patient will improve safety in shower - educated paitent on use of shower seat with back to use during showering, for moments of fatigue or unsteadiness.  patient transferred on and off of shower seat without difficulty. Did Client Try?: No Reason Client Did Not Try?: Other (bathroom under construction due to plugged toilet, demoed in living room) Clinician View Of Client Situation:: patient is receptive to use of dme.  unable to trial in bathroom due to bathroom being tore up for toilet clog.  patient does report that he tends to be falling asleep or "spacing out" without warning.  I recommended he call his MD and will relay this information to [employer] Client View Of His/Her Situation:: he feels he is doing well. he is concerned that he seems to be falling asleep at odd times, and this is happening more frequently. patient stated he would call his primpary care md to follow up on this concern. Next Visit Plan:: problem solve goal 2 shower and commode transfer safety upon completion of renovations from  [facility].  order rails for commode after new commode installed.  Shirlean Mylar, MHA, OTR/L [phone]

## 2021-09-25 NOTE — ED Notes (Signed)
Pt verbalizes understanding of discharge instructions. Opportunity for questioning and answers were provided. Pt discharged from ED to home.   ? ?

## 2021-09-25 NOTE — ED Provider Notes (Signed)
Emergency Department Provider Note   I have reviewed the triage vital signs and the nursing notes.   HISTORY  Chief Complaint Chest Pain   HPI Tyler Ellis is a 71 y.o. male with PMH reviewed presents to the emergency department for evaluation of central chest discomfort which feels that the patient like "chest congestion." He describes discomfort with last 2 days.  He states he feels like he needs to cough up some phlegm.  He is then able to cough up some large, sometimes green phlegm in his discomfort resolves temporarily. No fever although describes some diaphoresis today along with some urinary frequency/urgency. No dysuria. No abdominal or back pain. No active CP. No hemoptysis.    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   Enlarged prostate with urinary obstruction 05/11/2020   Essential hypertension 03/11/2017   Hypertension    Hyperthyroidism 06/26/2017   Medication management 06/06/2020   Multinodular goiter 07/03/2017   Nocturia 02/02/2014   NSTEMI (non-ST elevated myocardial infarction) (HCC) 02/27/2017   Palpitations 10/28/2019   Paroxysmal atrial fibrillation (HCC) 05/24/2020   Supraventricular tachycardia (HCC) 02/14/2018   Type 2 diabetes mellitus with complication, without Horatio Bertz-term current use of insulin (HCC) 03/11/2017    Review of Systems  Constitutional: No fever/chills Eyes: No visual changes. ENT: No sore throat. Cardiovascular: Positive chest pain. Respiratory: Denies shortness of breath. Positive cough and congestion.  Gastrointestinal: No abdominal pain.  No nausea, no vomiting.  No diarrhea.  No constipation. Genitourinary: Negative for dysuria. Positive urgency.  Musculoskeletal: Negative for back pain. Skin: Negative for  rash. Neurological: Negative for headaches, focal weakness or numbness.  ____________________________________________   PHYSICAL EXAM:  VITAL SIGNS: ED Triage Vitals  Enc Vitals Group     BP 09/25/21 1432 (!) 167/82     Pulse Rate 09/25/21 1432 85     Resp 09/25/21 1432 (!) 28     Temp 09/25/21 1432 98.3 F (36.8 C)     Temp Source 09/25/21 1432 Oral     SpO2 09/25/21 1432 95 %     Weight 09/25/21 1432 260 lb (117.9 kg)     Height 09/25/21 1432 5\' 7"  (1.702 m)   Constitutional: Alert and oriented. Well appearing and in no acute distress. Eyes: Conjunctivae are normal.  Head: Atraumatic. Nose: No congestion/rhinnorhea. Mouth/Throat: Mucous membranes are moist.   Neck: No stridor.   Cardiovascular: Normal rate, regular rhythm. Good peripheral circulation. Grossly normal heart sounds.   Respiratory: Normal respiratory effort.  No retractions. Lungs CTAB. No wheezing or rhonchi. Gastrointestinal: Soft and nontender. No distention.  Musculoskeletal: No lower extremity tenderness nor edema. No gross deformities of extremities. Neurologic:  Normal speech and language. No gross focal neurologic deficits are appreciated.  Skin:  Skin is warm, dry and intact. No rash noted.  ____________________________________________   LABS (all labs ordered are listed, but only abnormal results are displayed)  Labs Reviewed  BASIC METABOLIC PANEL - Abnormal; Notable for the following components:      Result Value   Glucose, Bld 211 (*)    All other components within normal limits  URINALYSIS, ROUTINE W REFLEX MICROSCOPIC - Abnormal; Notable for the following components:   Glucose, UA 100 (*)    Hgb urine dipstick TRACE (*)    Protein,  ur 100 (*)    All other components within normal limits  URINALYSIS, MICROSCOPIC (REFLEX) - Abnormal; Notable for the following components:   Bacteria, UA RARE (*)    All other components within normal limits  CBC  HEPATIC FUNCTION PANEL  LIPASE, BLOOD   TROPONIN I (HIGH SENSITIVITY)  TROPONIN I (HIGH SENSITIVITY)  TROPONIN I (HIGH SENSITIVITY)   ____________________________________________  EKG   EKG Interpretation  Date/Time:  Monday September 25 2021 14:35:55 EDT Ventricular Rate:  85 PR Interval:  174 QRS Duration: 72 QT Interval:  378 QTC Calculation: 449 R Axis:   3 Text Interpretation: Normal sinus rhythm Normal ECG When compared with ECG of 13-Dec-2018 11:27, PREVIOUS ECG IS PRESENT Confirmed by Alona Bene [phone]) on 09/25/2021 4:44:41 PM        ____________________________________________  RADIOLOGY  DG Chest 2 View  Result Date: 09/25/2021 CLINICAL DATA:  Chest pain for 1 week. EXAM: CHEST - 2 VIEW COMPARISON:  Chest x-ray 08/21/2021 FINDINGS: The cardiac silhouette, mediastinal and hilar contours are within normal limits and stable. Prominent paratracheal soft tissue density consistent with stable thyroid goiter, previously image with ultrasound. No acute pulmonary findings. No pleural effusions or pulmonary lesions. The bony thorax is intact. IMPRESSION: No acute cardiopulmonary findings. Electronically Signed   By: Rudie Meyer M.D.   On: 09/25/2021 14:59    ____________________________________________   PROCEDURES  Procedure(s) performed:   Procedures  None ____________________________________________   INITIAL IMPRESSION / ASSESSMENT AND PLAN / ED COURSE  Pertinent labs & imaging results that were available during my care of the patient were reviewed by me and considered in my medical decision making (see chart for details).   This patient is Presenting for Evaluation of CP, which does require a range of treatment options, and is a complaint that involves a high risk of morbidity and mortality.  The Differential Diagnoses includes but is not exclusive to acute coronary syndrome, aortic dissection, pulmonary embolism, cardiac tamponade, community-acquired pneumonia, pericarditis, musculoskeletal chest wall  pain, etc.   I decided to review pertinent External Data, and in summary no recent ED visits for similar.   Clinical Laboratory Tests Ordered, included negative troponin x 2. UA without evidence of infection. No AKI. Normal electrolytes. No leukocytosis.   Radiologic Tests Ordered, included CXR. I independently interpreted the images and agree with radiology interpretation.   Cardiac Monitor Tracing which shows NSR.    Social Determinants of Health Risk patient is a non-smoker.   Medical Decision Making: Summary:  Patient presents to the ED with CP and congestion. Seems most consistent with bronchitis type symptoms. No CAP on CXR. No pulmonary edema. Troponin negative x 2. Doubt atypical ACS.   Reevaluation with update and discussion with patient. Plan for prolonged bronchitis treatment with Z pack and PCP follow up.   Considered admission but low risk, atypical CP per HEART pathway with 2 negative troponin. Patient with PCP follow up plan and symptom mgmt.   Disposition: discharge  ____________________________________________  FINAL CLINICAL IMPRESSION(S) / ED DIAGNOSES  Final diagnoses:  Precordial chest pain     NEW OUTPATIENT MEDICATIONS STARTED DURING THIS VISIT:  Discharge Medication List as of 09/25/2021  8:34 PM     START taking these medications   Details  azithromycin (ZITHROMAX) 250 MG tablet Take 1 tablet (250 mg total) by mouth daily. Take first 2 tablets together, then 1 every day until finished., Starting Mon 09/25/2021, Normal        Note:  This document was  prepared using Conservation officer, historic buildings and may include unintentional dictation errors.  Alona Bene, MD, Tyler Ellis    Ahijah Devery, Arlyss Repress, MD 09/26/21 [phone]

## 2021-09-25 NOTE — ED Provider Triage Note (Signed)
Emergency Medicine Provider Triage Evaluation Note  Luman Holway , a 71 y.o. male  was evaluated in triage.  Pt complains of shortness of breath with working, couldn't get back up off the ground, was getting more and more out of breath -- this happened one week ago. Since then, started having more shortness of breath today with exertion, patient endorsed chest pain with exertion, global weakness. Hx of NSTEMI, DM2, HTN, SVT, HLD, needed to use cane which is not his baseline. Blood pressure has been poorly controlled, despite taking normal BP medication.  Additionally patient reports urinary incontinence with episodes of chest pain, shob. Hx of prostate issues, was supposed to have TURP or some other procedure.  Review of Systems  Positive: Chest pain, shob, incontinence Negative: Nausea, vomiting  Physical Exam  BP (!) 167/82 (BP Location: Right Arm)   Pulse 85   Temp 98.3 F (36.8 C) (Oral)   Resp (!) 28   Ht 5\' 7"  (1.702 m)   Wt 117.9 kg   SpO2 95%   BMI 40.72 kg/m  Gen:   Awake, no distress   Resp:  Normal effort  MSK:   Moves extremities without difficulty  Other:    Medical Decision Making  Medically screening exam initiated at 4:05 PM.  Appropriate orders placed.  Marquies Wanat was informed that the remainder of the evaluation will be completed by another provider, this initial triage assessment does not replace that evaluation, and the importance of remaining in the ED until their evaluation is complete.  Workup initiated   [name], Olene Floss 09/25/21 1611

## 2021-09-25 NOTE — ED Notes (Signed)
RT assessed in lobby. Patient stated he is having SOB and weakness, started today. SAT 96%, HR 76. Said his chest is tight and was having some urinary issues as well. Triage RN made aware. Will pull him back shortly.

## 2021-09-25 NOTE — ED Triage Notes (Signed)
C/O chest pains; patient c/o urinary incontinence at well x2; denies any recent injury or fall;

## 2021-09-25 NOTE — Discharge Instructions (Signed)
He was seen in the emergency department today with chest discomfort.  I am starting you on an antibiotic treating bronchitis to help with your symptoms.  Please follow closely with your primary care doctor in the coming week.  Return to the emergency department any new or suddenly worsening symptoms.

## 2021-10-04 LAB — ALBUMIN, URINE, RANDOM: Albumin, Urine POC: 9.2

## 2021-10-05 LAB — LAB REPORT - SCANNED: PSA, Total: 3.58

## 2021-10-05 NOTE — Chronic Care Management (AMB) (Signed)
Scheduled RN home visit #2 for 6/19 @ 10:30am - pt agreeable

## 2021-10-06 ENCOUNTER — Other Ambulatory Visit: Payer: Self-pay | Admitting: Cardiology

## 2021-10-09 ENCOUNTER — Other Ambulatory Visit: Payer: Self-pay

## 2021-10-09 NOTE — Telephone Encounter (Signed)
Rx refill sent to pharmacy. 

## 2021-10-09 NOTE — Patient Instructions (Addendum)
Goals Addressed             This Visit's Progress    Aging Gracefully RN Goal - Monitor and Manage My Blood Sugar-Diabetes Type 2       Timeframe:  Long-Range Goal Priority:  High Start Date:        09/01/21                     Expected End Date:                       10/09/21 Visit #2 Assessment: Patient reports his blood sugar has been good. He reports one hypoglycemic epidsode, but did not have meter with him and does not know how low it was. He reports had chill bumps, sweaty. He reports he took his medication, but did not eat. Patient states family member took him to [employer] and he got something to eat, then felt better.   Goals:  take medications as prescribed. Make sure you eat after taking your blood sugar medications Check blood sugar as prescribed Follow the Rule of 15 for treatment of hypoglycemia (Blood sugar than 70): STEP  1:  Take 15 grams of carbohydrates when your blood sugar is low, which includes:              3-4 glucose tabs or             3-4 oz of juice or regular soda or             One tube of glucose gel STEP 2:  Recheck blood sugar in 15 minutes STEP 3:  If your blood sugar is still low at the 15 minute recheck ---then, go back to STEP 1 and treat again with another 15 grams of carbohydrates  Interventions: Discussed importance of checking blood sugar before administering medications Discussed importance of eating after taking medications for diabetes Discussed importance of having meter with him so he can check blood sugar is he has any unusual events Provided How to thirive: a guide for your journey with diabetes provided.  review and plan to discuss at next visit.  Next Visit Scheduled: 11/02/21  Kathyrn Sheriff, RN, MSN, BSN, CCM Aging Gracefully Care Management Coordinator [phone]      Aging Gracefully RN Goal - Track and Manage My Blood Pressure-Hypertension       Timeframe:  Long-Range Goal Priority:  High Start Date: 09/01/21                             Expected End Date:   12/02/21                   10/09/21 Visit #2 Assessment: reports last blood pressure check 140/92. He states he does not use salt in cooking, however states uses a different seasing. RN looked at ingredients and first ingrediant was salt.  Interventions: Discussed limiting salt intake. Teaching regarding how to read labels completed ProvidedHigh Salt/Low salt food brochure Provided HTN handout Recommendations to use Ms. Dash, herbs, pepper when seasoning foods Encouraged to continue to monitor and record blood pressure  Next Visit Scheduled: 11/02/21  Kathyrn Sheriff, RN, MSN, BSN, CCM Aging Gracefully Care Management Coordinator [phone]

## 2021-10-09 NOTE — Patient Outreach (Addendum)
Aging Gracefully Program  RN Visit  10/09/2021  Nevyn Bossman 12/21/1950 [id number]  Visit:  RN Visit #2   Start Time:  10:00 am  End Time:  11:15 am  Total Minutes:  75 minutes   Readiness To Change Score:   "10" on 09/01/21  Universal RN Interventions: Calendar Distribution: Yes (RN provided calendar and explained how to use.) Exercise Review: Yes (Provided exerise program booklet and demonstrated with return demonstration each exercise.) Medications: Yes (medicatons reviewed) Medication Changes: No Mood: No (denies any problems with mood) Pain: No (denies any pain) PCP Advocacy/Support: No (reports has a good relationship with primary care provider) Fall Prevention: No (denies any falls) Incontinence: No (no report of incontinence) Clinician View Of Client Situation: Client is in good spirits. Greeted Charity fundraiser upon entering home. Client is receptive to information provided by RN and eager to learn new things. Client View Of His/Her Situation: Client states, "I think I am doing pretty good. Client reports blood sugars has not been elevated. Mr. Gabay reports an episode of low blood sugar on satuday 10/07/21. He states he had taken his diabetic medications and had not eaten.  Healthcare Provider Communication: Did Surveyor, mining With [employer] Provider?: No According to Client, Did PCP Report Communication With An Aging Gracefully RN?: No  Clinician View of Client Situation: Clinician View Of Client Situation: Client is in good spirits. Greeted Charity fundraiser upon entering home. Client is receptive to information provided by RN and eager to learn new things. Client's View of His/Her Situation: Client View Of His/Her Situation: Client states, "I think I am doing pretty good. Client reports blood sugars has not been elevated. Mr. Fitzner reports an episode of low blood sugar on satuday 10/07/21. He states he had taken his diabetic medications and had not eaten.  Medication Assessment: denies any  questions or concerns    OT Update: n/a  Session Summary: Client is receptive to and eager to learn things to improve his health. Strong emphasis taught today on checking blood sugar prior to taking medications, and the need to eat after taking diabetic medications. Also education on how to read food labels and salt substitute seasoning. Provided exercise booklet and reviewed each exercise with return demonstration.  Follow up scheduled: 11/02/21  Kathyrn Sheriff, RN, MSN, BSN, CCM Aging Gracefully Care Management Coordinator [phone]

## 2021-11-01 ENCOUNTER — Other Ambulatory Visit: Payer: Self-pay

## 2021-11-01 NOTE — Patient Instructions (Signed)
Visit Information  Thank you for taking time to visit with me today. Please don't hesitate to contact me if I can be of assistance to you before our next scheduled telephone appointment.  Following are the goals we discussed today:   Goals Addressed             This Visit's Progress    Aging Gracefully RN Goal - Monitor and Manage My Blood Sugar-Diabetes Type 2       Timeframe:  Long-Range Goal Priority:  High Start Date:        09/01/21                     Expected End Date:                       11/01/2021 Assessment:  Patient reports CBG of 123 today.Denies any recent low.Self monitors daily.  Reports that he does not take his medications until he eats.  Occasionally has a decreased appetite.  Reports that he is dong his home exercises and that she stays busy.  Patient very happy with his modifications.  Interventions: assess CBG control and medications. Reviewed pending new PCP appointment. Reviewed importance of follow up and labs. Encouraged patient to take his meter and or readings to MD appointment. Encouraged patient to do his home exercises daily. PLAN: next home visit  11/09/2021  [name] RN, BSN, CEN RN Case Manager for [employer] [facility] Mobile: [phone]   10/09/21 Visit #2 Assessment: Patient reports his blood sugar has been good. He reports one hypoglycemic epidsode, but did not have meter with him and does not know how low it was. He reports had chill bumps, sweaty. He reports he took his medication, but did not eat. Patient states family member took him to [employer] and he got something to eat, then felt better.   Goals:  take medications as prescribed. Make sure you eat after taking your blood sugar medications Check blood sugar as prescribed Follow the Rule of 15 for treatment of hypoglycemia (Blood sugar than 70): STEP  1:  Take 15 grams of carbohydrates when your blood sugar is low, which includes:              3-4 glucose tabs or              3-4 oz of juice or regular soda or             One tube of glucose gel STEP 2:  Recheck blood sugar in 15 minutes STEP 3:  If your blood sugar is still low at the 15 minute recheck ---then, go back to STEP 1 and treat again with another 15 grams of carbohydrates  Interventions: Discussed importance of checking blood sugar before administering medications Discussed importance of eating after taking medications for diabetes Discussed importance of having meter with him so he can check blood sugar is he has any unusual events Provided How to thirive: a guide for your journey with diabetes provided.  review and plan to discuss at next visit.  Kathyrn Sheriff, RN, MSN, BSN, CCM Aging Gracefully Care Management Coordinator [phone]  09/01/21 Initial Visit:  - check blood sugar if I feel it is too high or too low - take the blood sugar meter to all doctor visits    Why is this important?   Checking your blood sugar at home helps to keep it from getting very high or very  low.  Writing the results in a diary or log helps the doctor know how to care for you.  Your blood sugar log should have the time, the date and the results.  Also, write down the amount of insulin or other medicine you take.  Other information like what you ate, exercise done and how you were feeling will also be helpful..     Notes:  Client states he is monitoring CBG twice daily.  Education given to monitor blood glucose when he feels signs and symptoms of hyperglycemia and hypoglycemia and manage diet to decrease sugary drinks and simple sugar snacks.  Reviewed signs and symptoms of hyper/hypoglycemia.  Share calendar, notebook and exercises at next visit.           Our next appointment is 11/09/2021  home visit btween 2-230pm  Please call the care guide team at [phone] if you need to cancel or reschedule your appointment.   If you are experiencing a Mental Health or Behavioral Health Crisis or need  someone to talk to, please call the Suicide and Crisis Lifeline: 988 call the [place] National Suicide Prevention Lifeline: 530-682-9483 or TTY: 860-423-0226 TTY 563-785-3930) to talk to a trained counselor call 1-800-273-TALK (toll free, 24 hour hotline) call 911   The patient verbalized understanding of instructions, educational materials, and care plan provided today and agreed to receive a mailed copy of patient instructions, educational materials, and care plan.   [name] RN, BSN, Careers adviser for [employer] Mobile: [phone]

## 2021-11-01 NOTE — Patient Outreach (Signed)
Aging Gracefully Program  Ellis Visit  11/01/2021  Tyler Ellis 09/16/1950 [id number]  Visit:   Ellis home visit #3  Start Time:   1200 End Time:   1315 Total Minutes:   75  Readiness To Change Score:     Universal Ellis Interventions: Calendar Distribution: Yes Exercise Review: Yes Medications: Yes Medication Changes: No Mood: No Pain: No PCP Advocacy/Support: No Fall Prevention: Yes Incontinence: No Clinician View Of Client Situation: Home neat and clean.  Lives alone.  Good spirits. awake and alert. ambulating well. Client View Of His/Her Situation: CBG 123 today.  Reports he is dong well  Drives.  denies any recent falls. Has a new PCP [facility]. Has an MD appointment in 2 weeks.  Reports right arm goe numb at times. Has an appointment on August 4 ortho. Denies any low CBG.  Healthcare Provider Communication:none    Clinician View of Client Situation: Diplomatic Services operational officer Of Client Situation: Home neat and clean.  Lives alone.  Good spirits. awake and alert. ambulating well. Client's View of His/Her Situation: Client View Of His/Her Situation: CBG 123 today.  Reports he is dong well  Drives.  denies any recent falls. Has a new PCP [facility]. Has an MD appointment in 2 weeks.  Reports right arm goe numb at times. Has an appointment on August 4 ortho. Denies any low CBG.  Medication Assessment:reviewed medications. Denies any concerns about medications.     OT Update: in basket message sent to occupational therapist.  Session Summary: Doing well. Home modifications completed by community housing- gutters, walk in shower, elevated toilet seat, Grab bars   Goals Addressed             This Visit's Progress    Aging Gracefully Ellis Goal - Monitor and Manage My Blood Sugar-Diabetes Type 2       Timeframe:  Long-Range Goal Priority:  High Start Date:        09/01/21                     Expected End Date:                       11/01/2021 Assessment:  Patient reports CBG of  123 today.Denies any recent low.Self monitors daily.  Reports that he does not take his medications until he eats.  Occasionally has a decreased appetite.  Reports that he is dong his home exercises and that she stays busy.  Patient very happy with his modifications.  Interventions: assess CBG control and medications. Reviewed pending new PCP appointment. Reviewed importance of follow up and labs. Encouraged patient to take his meter and or readings to MD appointment. Encouraged patient to do his home exercises daily. PLAN: next home visit  11/09/2021  Tyler Ellis, BSN, CEN Ellis Case Manager for [employer] [facility] Mobile: [phone]   10/09/21 Visit #2 Assessment: Patient reports his blood sugar has been good. He reports one hypoglycemic epidsode, but did not have meter with him and does not know how low it was. He reports had chill bumps, sweaty. He reports he took his medication, but did not eat. Patient states family member took him to [employer] and he got something to eat, then felt better.   Goals:  take medications as prescribed. Make sure you eat after taking your blood sugar medications Check blood sugar as prescribed Follow the Rule of 15 for treatment of hypoglycemia (Blood sugar than 70): STEP  1:  Take 15 grams of carbohydrates when your blood sugar is low, which includes:              3-4 glucose tabs or             3-4 oz of juice or regular soda or             One tube of glucose gel STEP 2:  Recheck blood sugar in 15 minutes STEP 3:  If your blood sugar is still low at the 15 minute recheck ---then, go back to STEP 1 and treat again with another 15 grams of carbohydrates  Interventions: Discussed importance of checking blood sugar before administering medications Discussed importance of eating after taking medications for diabetes Discussed importance of having meter with him so he can check blood sugar is he has any unusual events Provided How to  thirive: a guide for your journey with diabetes provided.  review and plan to discuss at next visit.  Kathyrn Sheriff, RN, MSN, BSN, CCM Aging Gracefully Care Management Coordinator [phone]  09/01/21 Initial Visit:  - check blood sugar if I feel it is too high or too low - take the blood sugar meter to all doctor visits    Why is this important?   Checking your blood sugar at home helps to keep it from getting very high or very low.  Writing the results in a diary or log helps the doctor know how to care for you.  Your blood sugar log should have the time, the date and the results.  Also, write down the amount of insulin or other medicine you take.  Other information like what you ate, exercise done and how you were feeling will also be helpful..     Notes:  Client states he is monitoring CBG twice daily.  Education given to monitor blood glucose when he feels signs and symptoms of hyperglycemia and hypoglycemia and manage diet to decrease sugary drinks and simple sugar snacks.  Reviewed signs and symptoms of hyper/hypoglycemia.  Share calendar, notebook and exercises at next visit.         Tyler Ellis, BSN, Careers adviser for [employer] Mobile: [phone]

## 2021-11-06 ENCOUNTER — Other Ambulatory Visit: Payer: Self-pay | Admitting: Cardiology

## 2021-11-08 ENCOUNTER — Ambulatory Visit (INDEPENDENT_AMBULATORY_CARE_PROVIDER_SITE_OTHER): Payer: Medicare Other | Admitting: Medical

## 2021-11-08 VITALS — BP 140/78 | HR 92 | Temp 98.0°F | Resp 16 | Ht 67.0 in | Wt 247.2 lb

## 2021-11-08 DIAGNOSIS — I480 Paroxysmal atrial fibrillation: Secondary | ICD-10-CM | POA: Diagnosis not present

## 2021-11-08 DIAGNOSIS — E0590 Thyrotoxicosis, unspecified without thyrotoxic crisis or storm: Secondary | ICD-10-CM

## 2021-11-08 DIAGNOSIS — I2510 Atherosclerotic heart disease of native coronary artery without angina pectoris: Secondary | ICD-10-CM

## 2021-11-08 DIAGNOSIS — N401 Enlarged prostate with lower urinary tract symptoms: Secondary | ICD-10-CM

## 2021-11-08 DIAGNOSIS — N138 Other obstructive and reflux uropathy: Secondary | ICD-10-CM

## 2021-11-08 DIAGNOSIS — E119 Type 2 diabetes mellitus without complications: Secondary | ICD-10-CM

## 2021-11-08 DIAGNOSIS — R7989 Other specified abnormal findings of blood chemistry: Secondary | ICD-10-CM

## 2021-11-08 DIAGNOSIS — F419 Anxiety disorder, unspecified: Secondary | ICD-10-CM

## 2021-11-08 MED ORDER — METOPROLOL SUCCINATE ER 200 MG PO TB24: 200.0000 mg | ORAL_TABLET | Freq: Every day | ORAL | 1 refills | Status: DC

## 2021-11-08 NOTE — Patient Instructions (Addendum)
High cholesterol. Atorvastatin 80 mg daily and zetia 10 mg daily.  Diabetic- pt on lantus 50 units once a day. Stopped humalin. On, metformin  glucotrol 10 mg 2 tab daily. 08-23-2021 a1c 8.4. Not well controlled. Advised can titrate by one unit a day based on fasting sugar in am. Using 110 as goal. Pt expressed understanding on how to titrate.  Paroxysmal atrial fibrillation- flecainide, eliquis, torsemide, klor-con  and diltiazem.  Htn- losartan 100 mg daily.(2 of the 50 mg tabs.) metoprolol xl 200 mg daily.  Anxiety- use xanax 1-2 times a month.  Bph-  flomax daily.  Pt had abnormal labs in 2019. Possible hyperthyroid.   Will get future labs cmp, tsh, t4, thyroid peroxidase antibody, lipid panel and a1c. Week of November 27, 2021. Get scheduled for the fasting labs.  Follow week of December 04, 2021 but sooner if needed.

## 2021-11-08 NOTE — Progress Notes (Signed)
Subjective:    Patient ID: Tyler Ellis, male    DOB: [birth year], 71 y.o.   MRN: [id number]  HPI  Pt in for first time.   Former worked Network engineer. Mild exercise riding bike. One cup coffee a day. Moderate healthy diet. He is eating less recently. Non smoker. No alcohol.  Diabetic- pt on lantus 50 units once a day. Stopped humalin. On glucotrol 10 mg 2 tab daily. 08-23-2021 a1c 8.4.  Since may he is getting 140 sugar in am fasting. Lowest/rare in 80's.     High cholesterol. Atorvastatin 80 mg daily and zetia 10 mg daily.  Paroxysmal atrial fibrillation- flecainide, eliquis, torsemide, klor-con  and diltiazem.  Htn- losartan 100 mg daily.(2 of the 50 mg tabs.) metoprolol xl 200 mg daily.  Anxiety- use xanax 1-2 times a month.  Bph-  flomax daily.  Pt had abnormal labs in 2019. Possible hyperthyroid. Pt was unaware.     Review of Systems  Constitutional:  Negative for chills, fatigue and fever.  Respiratory:  Negative for chest tightness, shortness of breath and wheezing.   Cardiovascular:  Negative for chest pain and palpitations.  Gastrointestinal:  Negative for abdominal pain.  Genitourinary:  Negative for dysuria, flank pain, hematuria, penile discharge and penile pain.  Musculoskeletal:  Negative for back pain.  Skin:  Negative for rash.  Neurological:  Negative for dizziness, tremors and seizures.  Hematological:  Negative for adenopathy. Does not bruise/bleed easily.  Psychiatric/Behavioral:  Negative for behavioral problems, decreased concentration and hallucinations. The patient is not nervous/anxious.     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   Enlarged prostate with urinary obstruction 05/11/2020   Essential hypertension 03/11/2017    Hypertension    Hyperthyroidism 06/26/2017   Medication management 06/06/2020   Multinodular goiter 07/03/2017   Nocturia 02/02/2014   NSTEMI (non-ST elevated myocardial infarction) (HCC) 02/27/2017   Palpitations 10/28/2019   Paroxysmal atrial fibrillation (HCC) 05/24/2020   Supraventricular tachycardia (HCC) 02/14/2018   Type 2 diabetes mellitus with complication, without long-term current use of insulin (HCC) 03/11/2017     Social History   Socioeconomic History   Marital status: Divorced    Spouse name: Not on file   Number of children: Not on file   Years of education: Not on file   Highest education level: Not on file  Occupational History   Not on file  Tobacco Use   Smoking status: Never   Smokeless tobacco: Never  Vaping Use   Vaping Use: Never used  Substance and Sexual Activity   Alcohol use: Yes    Comment: weekly   Drug use: No   Sexual activity: Not on file  Other Topics Concern   Not on file  Social History Narrative   Not on file   Social Determinants of Health   Financial Resource Strain: Not on file  Food Insecurity: Not on file  Transportation Needs: Not on file  Physical Activity: Not on file  Stress: Not on file  Social Connections: Not on file  Intimate Partner Violence: Not on file    Past Surgical History:  Procedure Laterality Date   KNEE SURGERY     LEFT HEART CATH AND CORONARY ANGIOGRAPHY N/A 02/28/2017   Procedure: LEFT HEART CATH AND CORONARY ANGIOGRAPHY;  Surgeon: Kathleene Hazel, MD;  Location: [facility] INVASIVE CV LAB;  Service: Cardiovascular;  Laterality: N/A;    Family History  Problem Relation Age of Onset   Hypertension Father    Hypertension Mother     No Known Allergies  Current Outpatient Medications on File Prior to Visit  Medication Sig Dispense Refill   acetaminophen-codeine (TYLENOL #3) 300-30 MG tablet Take 1 tablet every 8 (eight) hours as needed by mouth for moderate pain.      ALPRAZolam (XANAX) 0.5 MG tablet  Take 0.25-0.5 mg by mouth 3 (three) times daily as needed for anxiety.     apixaban (ELIQUIS) 5 MG TABS tablet Take 1 tablet (5 mg total) by mouth 2 (two) times daily. 180 tablet 1   atorvastatin (LIPITOR) 80 MG tablet Take 1 tablet (80 mg total) daily by mouth. 30 tablet 3   azithromycin (ZITHROMAX) 250 MG tablet Take 1 tablet (250 mg total) by mouth daily. Take first 2 tablets together, then 1 every day until finished. 6 tablet 0   COVID-19 mRNA bivalent vaccine, Pfizer, injection Inject into the muscle. 0.3 mL 0   diltiazem (CARDIZEM CD) 360 MG 24 hr capsule Take 1 capsule (360 mg total) by mouth daily. 90 capsule 1   esomeprazole (NEXIUM) 20 MG capsule Take 20 mg by mouth daily at 12 noon.     ezetimibe (ZETIA) 10 MG tablet Take 1 tablet (10 mg total) by mouth daily. 90 tablet 2   flecainide (TAMBOCOR) 50 MG tablet Take 1 tablet (50 mg total) by mouth 2 (two) times daily. 60 tablet 5   glipiZIDE (GLUCOTROL) 10 MG tablet Take 10 mg 2 (two) times daily before a meal by mouth.      guaiFENesin (MUCINEX) 600 MG 12 hr tablet Take 600 mg by mouth daily as needed.     HUMULIN N KWIKPEN 100 UNIT/ML Kiwkpen Inject 51 Units into the skin daily.     hydrOXYzine (ATARAX/VISTARIL) 10 MG tablet Take 10 mg by mouth in the morning and at bedtime.     influenza vaccine adjuvanted (FLUAD) 0.5 ML injection Inject into the muscle. 0.5 mL 0   LANTUS SOLOSTAR 100 UNIT/ML Solostar Pen Inject 50 Units into the skin at bedtime.     losartan (COZAAR) 100 MG tablet Take 100 mg by mouth daily.     losartan (COZAAR) 50 MG tablet TAKE 1 TABLET(50 MG) BY MOUTH DAILY 90 tablet 3   magnesium chloride (SLOW-MAG) 64 MG TBEC SR tablet Take 1 tablet (64 mg total) by mouth 2 (two) times daily. 60 tablet 5   metFORMIN (GLUCOPHAGE) 1000 MG tablet Take 1,000 mg by mouth 2 (two) times daily.     metoprolol (TOPROL-XL) 200 MG 24 hr tablet Take 1 tablet (200 mg total) by mouth daily. 90 tablet 1   potassium chloride (KLOR-CON) 10 MEQ  tablet Take 1 tablet (10 mEq total) by mouth daily. 90 tablet 0   tamsulosin (FLOMAX) 0.4 MG CAPS capsule Take 0.4 mg by mouth daily.     torsemide (DEMADEX) 20 MG tablet Take 1 tablet (20 mg total) by mouth daily. 90 tablet 0   nitroGLYCERIN (NITROSTAT) 0.4 MG SL tablet Place 1 tablet (0.4 mg total) under the tongue every 5 (five) minutes as needed for chest pain. 25 tablet 2   No current facility-administered medications on file prior to visit.    BP 140/78 (BP Location: Right Arm, Patient Position: Sitting, Cuff Size: Normal)   Pulse 92   Temp  98 F (36.7 C) (Oral)   Resp 16   Ht 5\' 7"  (1.702 m)   Wt 247 lb 3.2 oz (112.1 kg)   SpO2 96%   BMI 38.72 kg/m        Objective:   Physical Exam  General Mental Status- Alert. General Appearance- Not in acute distress.   Skin General: Color- Normal Color. Moisture- Normal Moisture.  Neck No JVD. No obvious thyromegaly on palpation.  Chest and Lung Exam Auscultation: Breath Sounds:-Normal.  Cardiovascular Auscultation:Rythm- Regular. Murmurs & Other Heart Sounds:Auscultation of the heart reveals- No Murmurs.  Abdomen Inspection:-Inspeection Normal. Palpation/Percussion:Note:No mass. Palpation and Percussion of the abdomen reveal- Non Tender, Non Distended + BS, no rebound or guarding.   Neurologic Cranial Nerve exam:- CN III-XII intact(No nystagmus), symmetric smile. Strength:- 5/5 equal and symmetric strength both upper and lower extremities.   Lower ext- no pedal edema. Calf symmetric.      Assessment & Plan:   Patient Instructions  High cholesterol. Atorvastatin 80 mg daily and zetia 10 mg daily.  Diabetic- pt on lantus 50 units once a day. Stopped humalin. On, metformin  glucotrol 10 mg 2 tab daily. 08-23-2021 a1c 8.4. Not well controlled. Advised can titrate by one unit a day based on fasting sugar in am. Using 110 as goal. Pt expressed understanding on how to titrate.  Paroxysmal atrial fibrillation- flecainide,  eliquis, torsemide, klor-con  and diltiazem.  Htn- losartan 100 mg daily.(2 of the 50 mg tabs.) metoprolol xl 200 mg daily.  Anxiety- use xanax 1-2 times a month.  Bph-  flomax daily.  Pt had abnormal labs in 2019. Possible hyperthyroid.   Will get future labs cmp, tsh, t4, thyroid peroxidase antibody, lipid panel and a1c. Week of November 27, 2021. Get scheduled for the fasting labs.  Follow week of December 04, 2021 but sooner if needed.   December 06, 2021, PA-C   Time spent with patient today was 46  minutes which consisted of chart review records, discussing diagnoses, work up, treatment and documentation.

## 2021-11-10 ENCOUNTER — Telehealth: Payer: Self-pay

## 2021-11-10 NOTE — Patient Outreach (Signed)
Aging Gracefully Program  11/10/2021  Maxine Huynh 1950/11/18 [id number]   Placed call to patient to reschedule cancelled appointment, No answer. Left a message requesting a call back.  [name] RN, BSN, Careers adviser for [employer] Mobile: [phone]

## 2021-11-13 ENCOUNTER — Telehealth: Payer: Self-pay | Admitting: Specialist

## 2021-11-13 NOTE — Telephone Encounter (Signed)
Called patient attempting to schedule next OT visit for Aging Gracefully.  No answer, left message requesting a return call. Shirlean Mylar, MHA, OTR/L [phone]

## 2021-11-15 ENCOUNTER — Other Ambulatory Visit: Payer: Self-pay

## 2021-11-15 NOTE — Patient Outreach (Signed)
Aging Gracefully Program  11/15/2021  Tyler Ellis 29-Mar-1951 [id number]   Placed call to patient in attempts to reschedule cancelled home visit. No answer. I left a message requesting a call back.  [name] RN, BSN, Careers adviser for [employer] Mobile: [phone]

## 2021-11-22 ENCOUNTER — Telehealth: Payer: Self-pay

## 2021-11-22 NOTE — Patient Outreach (Signed)
Aging Gracefully Program  11/22/2021  Tyler Ellis [birth year] [id number]   Placed call to patient to reschedule previously cancelled appointment. Left a message requesting a call back.  [name] RN, BSN, Careers adviser for [employer] Mobile: [phone]

## 2021-11-24 ENCOUNTER — Other Ambulatory Visit: Payer: Self-pay

## 2021-11-24 NOTE — Patient Outreach (Signed)
Aging Gracefully Program  11/24/2021  Tyler Ellis 1950-11-23 [id number]   Went to patients home to check on him since he has not answered the phone in weeks.   No answer at the door. I left my card in his front door.   PLAN: will continue to attempt outreaches to complete final nursing home visit.  [name] RN, BSN, Careers adviser for [employer] Mobile: [phone]

## 2021-11-27 ENCOUNTER — Other Ambulatory Visit (INDEPENDENT_AMBULATORY_CARE_PROVIDER_SITE_OTHER): Payer: Medicare Other

## 2021-11-27 ENCOUNTER — Other Ambulatory Visit: Payer: Self-pay

## 2021-11-27 DIAGNOSIS — R7989 Other specified abnormal findings of blood chemistry: Secondary | ICD-10-CM

## 2021-11-27 DIAGNOSIS — E0590 Thyrotoxicosis, unspecified without thyrotoxic crisis or storm: Secondary | ICD-10-CM | POA: Diagnosis not present

## 2021-11-27 DIAGNOSIS — E119 Type 2 diabetes mellitus without complications: Secondary | ICD-10-CM

## 2021-11-27 DIAGNOSIS — I2510 Atherosclerotic heart disease of native coronary artery without angina pectoris: Secondary | ICD-10-CM

## 2021-11-27 LAB — LIPID PANEL
Cholesterol: 114 mg/dL (ref 0–200)
HDL: 38.7 mg/dL — ABNORMAL LOW (ref >39.00)
LDL Cholesterol: 53 mg/dL (ref 0–99)
NonHDL: 75.1
Total CHOL/HDL Ratio: 3
Triglycerides: 112 mg/dL (ref 0.0–149.0)
VLDL: 22.4 mg/dL (ref 0.0–40.0)

## 2021-11-27 LAB — COMPREHENSIVE METABOLIC PANEL
ALT: 19 U/L (ref 0–53)
AST: 16 U/L (ref 0–37)
Albumin: 4.3 g/dL (ref 3.5–5.2)
Alkaline Phosphatase: 69 U/L (ref 39–117)
BUN: 14 mg/dL (ref 6–23)
CO2: 22 mEq/L (ref 19–32)
Calcium: 9.6 mg/dL (ref 8.4–10.5)
Chloride: 100 mEq/L (ref 96–112)
Creatinine, Ser: 1.16 mg/dL (ref 0.40–1.50)
GFR: 63.6 mL/min (ref >60.00)
Glucose, Bld: 159 mg/dL — ABNORMAL HIGH (ref 70–99)
Potassium: 4.1 mEq/L (ref 3.5–5.1)
Sodium: 143 mEq/L (ref 135–145)
Total Bilirubin: 0.6 mg/dL (ref 0.2–1.2)
Total Protein: 6.5 g/dL (ref 6.0–8.3)

## 2021-11-27 LAB — HEMOGLOBIN A1C: Hgb A1c MFr Bld: 7 % — ABNORMAL HIGH (ref 4.6–6.5)

## 2021-11-27 LAB — TSH: TSH: 0.78 u[IU]/mL (ref 0.35–5.50)

## 2021-11-27 LAB — T4, FREE: Free T4: 1.04 ng/dL (ref 0.60–1.60)

## 2021-11-27 NOTE — Patient Outreach (Signed)
Aging Gracefully Program  11/27/2021  Tyler Ellis May 19, 1950 [id number]   Incoming call from [employer] with patient on the line. I was able to make a follow up appointment with patient  for 12/11/2021  [name] RN, BSN, CEN RN Case Manager for Aging Chemical engineer Mobile: [phone]

## 2021-11-29 LAB — THYROID PEROXIDASE ANTIBODY: Thyroperoxidase Ab SerPl-aCnc: <1 IU/mL (ref <9)

## 2021-12-04 ENCOUNTER — Other Ambulatory Visit: Payer: Self-pay | Admitting: Specialist

## 2021-12-04 ENCOUNTER — Ambulatory Visit: Payer: Medicare Other

## 2021-12-04 NOTE — Patient Outreach (Signed)
Aging Gracefully Program  OT Follow-Up Visit  12/04/2021  Tyler Ellis [birth year] [id number]  Visit:  3- Third Visit  Start Time:  1230 End Time:  1315 Total Minutes:  45  Readiness to Change Score :  Readiness to Change Score: 10    Patient Education: Education Provided: Yes Education Details: educated patient on how to safely enter and exit the walk in shower. Person(s) Educated: Patient Comprehension: Verbalized Understanding  Goals:   Goals Addressed             This Visit's Progress    Patient Stated   On track    Patient will improve safety while getting in and out of the shower. ACTION PLANNING - FUNCTIONAL MOBILITY Target Problem Area: Decreased safety and independence with toilet and tub transfers.    Why Problem May Occur: Difficult to raise legs over tub wall Toilet seats are extremely low to the ground. No grab bars present to use as support with transfers.    Target Goal: Patient will demonstrate improved independence with toilet and tub transfers.    STRATEGIES Saving Your Energy: DO: DON'T:  Take breaks    Raise the height of surfaces    Take frequent rests. Just taking 15 minutes in a comfortable chair before becoming fatigued may help to restore your energy   Remove tripping hazards     Other   Modifying your home environment and making it safe: DO: DON'T:  Install grab bars in the bathroom Hold onto unsafe surfaces (towel racks, shower curtains, soap dishes, etc)  Remove or strongly secure throw rugs    Provide adequate lighting Use dim lights or lights that cast a lot of shadows  Other conversion of tub to walk in shower    Other replace low commodes with comfort level commodes.    Simplifying the way you set up tasks or daily routines: DO: DON'T:  Move slowly Rush during transfers or walking   Other   Other    Practice It is important to practice the strategies so we can determine if they will be effective in helping to reach your  goal. Follow these specific recommendations:  Use grab bars. 2.    Don't rush when transferring.  3.  If a strategy does not work the first time, try it again and again (and maybe again). We may make some changes over the next few sessions, based on how they work.          Patient Stated   On track    Patient will improve safety getting on and off of the commode.         Post Clinical Reasoning: Client Action (Goal) Two Interventions: patient will improve safety with toilet and tub transfers. Did Client Try?: Yes Targeted Problem Area Status: A Lot Better Clinician View Of Client Situation:: Tyler Ellis is doing very well using his newly installed comfort level commodes and walk in shower.  Commodes are next to sinks and he uses sinks as support when transferring. we discussed possility of toilet based railings and he declined this possibility.  He presents in much better health than my last visit in June.  He is transferring with less difficulty, breathing better, and states he feels much better. Client View Of His/Her Situation:: He is doing much better - patient states he learns something new each time myself or the nurse visit.  He has been doing his exercises and feels much safer and more confident with shower and toilet  transfers with his new modifications to his bathrooms! Next Visit Plan:: problem solve goal 4 - completing grooming at sink with less pain and revew tips for aging in place booklet.  Shirlean Mylar, MHA, OTR/L [phone]

## 2021-12-11 ENCOUNTER — Other Ambulatory Visit: Payer: Self-pay

## 2021-12-11 NOTE — Patient Instructions (Signed)
Visit Information  Thank you for taking time to visit with me today. Please don't hesitate to contact me if I can be of assistance to you before our next scheduled telephone appointment.  Following are the goals we discussed today:   Goals Addressed             This Visit's Progress    COMPLETED: Aging Gracefully RN Goal - Monitor and Manage My Blood Sugar-Diabetes Type 2       Timeframe:  Long-Range Goal Priority:  High Start Date:        09/01/21                     Expected End Date:                      12/11/2021 Assessment:  reviewed A1c from 2 weeks ago to be 7.0   Review CBG ranges are good.  States that he is eating regular meals. Denies any recent low CBG readings.  Interventions: Encouraged patient to self monitor his CBG if he has one of there reported spells. Encouraged patient to write down his CBG readings and take with him the his MD appointments. Plan: Goal met.   Tyler Rand RN, BSN, CEN RN Case Freight forwarder for [employer] Mobile: [phone]  11/01/2021 Assessment:  Patient reports CBG of 123 today.Denies any recent low.Self monitors daily.  Reports that he does not take his medications until he eats.  Occasionally has a decreased appetite.  Reports that he is dong his home exercises and that she stays busy.  Patient very happy with his modifications.  Interventions: assess CBG control and medications. Reviewed pending new PCP appointment. Reviewed importance of follow up and labs. Encouraged patient to take his meter and or readings to MD appointment. Encouraged patient to do his home exercises daily. PLAN: next home visit  11/09/2021  Tyler Rand RN, BSN, CEN RN Case Manager for [employer] Mobile: [phone]   10/09/21 Visit #2 Assessment: Patient reports his blood sugar has been good. He reports one hypoglycemic epidsode, but did not have meter with him and does not know how low it was. He reports had  chill bumps, sweaty. He reports he took his medication, but did not eat. Patient states family member took him to [employer] and he got something to eat, then felt better.   Goals:  take medications as prescribed. Make sure you eat after taking your blood sugar medications Check blood sugar as prescribed Follow the Rule of 15 for treatment of hypoglycemia (Blood sugar than 70): STEP  1:  Take 15 grams of carbohydrates when your blood sugar is low, which includes:              3-4 glucose tabs or             3-4 oz of juice or regular soda or             One tube of glucose gel STEP 2:  Recheck blood sugar in 15 minutes STEP 3:  If your blood sugar is still low at the 15 minute recheck ---then, go back to STEP 1 and treat again with another 15 grams of carbohydrates  Interventions: Discussed importance of checking blood sugar before administering medications Discussed importance of eating after taking medications for diabetes Discussed importance of having meter with him so he can check blood sugar is he has any unusual events  Provided How to thirive: a guide for your journey with diabetes provided.  review and plan to discuss at next visit.  Tyler Silversmith, RN, MSN, BSN, [place] Management Coordinator [phone]  09/01/21 Initial Visit:  - check blood sugar if I feel it is too high or too low - take the blood sugar meter to all doctor visits    Why is this important?   Checking your blood sugar at home helps to keep it from getting very high or very low.  Writing the results in a diary or log helps the doctor know how to care for you.  Your blood sugar log should have the time, the date and the results.  Also, write down the amount of insulin or other medicine you take.  Other information like what you ate, exercise done and how you were feeling will also be helpful..     Notes:  Client states he is monitoring CBG twice daily.  Education given to monitor blood  glucose when he feels signs and symptoms of hyperglycemia and hypoglycemia and manage diet to decrease sugary drinks and simple sugar snacks.  Reviewed signs and symptoms of hyper/hypoglycemia.  Share calendar, notebook and exercises at next visit.       Aging Gracefully RN Goal - Track and Manage My Blood Pressure-Hypertension       Timeframe:  Long-Range Goal Priority:  High Start Date: 09/01/21                            Expected End Date:   12/02/21                  12/11/2021  Assessment: Patient reports that he last checked his BP was a week ago and it was normal. Reports that his BP has been good since he cut back on his salt.   Intervention:  Encouraged patient to self monitor BP 3 times per week.  Encouraged patient to write down his readings. Reviewed pending cardiology appointment.   Plan: goal met.   Tyler Rand RN, BSN, CEN RN Case Freight forwarder for [employer] Mobile: [phone]  10/09/21 Visit #2 Assessment: reports last blood pressure check 140/92. He states he does not use salt in cooking, however states uses a different seasing. RN looked at ingredients and first ingrediant was salt.  Interventions: Discussed limiting salt intake. Teaching regarding how to read labels completed ProvidedHigh Salt/Low salt food brochure Provided HTN handout Recommendations to use Ms. Dash, herbs, pepper when seasoning foods Encouraged to continue to monitor and record blood pressure  Tyler Silversmith, RN, MSN, BSN, [place] Management Coordinator [phone]  09/01/21 Initial Visit - choose a place to take my blood pressure (home, clinic or office, retail store) - write blood pressure results in a log or diary    Why is this important?   You won't feel high blood pressure, but it can still hurt your blood vessels.  High blood pressure can cause heart or kidney problems. It can also cause a stroke.  Making lifestyle changes like losing a  little weight or eating less salt will help.  Checking your blood pressure at home and at different times of the day can help to control blood pressure.  If the doctor prescribes medicine remember to take it the way the doctor ordered.  Call the office if you cannot afford the medicine or if there are questions about it.  Notes:  Client states his blood pressure has been elevated and he needs to get it under control.  Education given around diet and exercise.  Client states he doesn't understand how to read labels.  Sodium is sometimes an issue.  He does rinse can vegetables but then puts cured meat in the vegetables to Tyler Ellis.  States he didn't realize he was adding the sodium back to his food.  Sending sodium education sheet.          .   If you are experiencing a Mental Health or [place] or need someone to talk to, please call the Suicide and Crisis Lifeline: 988 call the [place] National Suicide Prevention Lifeline: 639 541 7508 or TTY: 713 862 5614 TTY (520)401-4740) to talk to a trained counselor call 1-800-273-TALK (toll free, 24 hour hotline) go to [facility] [place] (818) 623-2221) call 911   The patient verbalized understanding of instructions, educational materials, and care plan provided today and agreed to receive a mailed copy of patient instructions, educational materials, and care plan.  Tyler Rand RN, BSN, Careers information officer for [employer] Mobile: [phone]

## 2021-12-11 NOTE — Patient Outreach (Signed)
Aging Gracefully Program  Ellis Visit  12/11/2021  Tyler Ellis 1950-11-29 [id number]  Visit:   Ellis home visit #4  Start Time:   1250 End Time:   1330 Total Minutes:   40  Readiness To Change Score:     Universal Ellis Interventions: Calendar Distribution: Yes Exercise Review: Yes Medications: Yes Medication Changes: No Mood: Yes Pain: Yes PCP Advocacy/Support: No Fall Prevention: Yes Incontinence: No Clinician View Of Client Situation: Home neat and clean.  Patient happy.  Ambulating well. well dressed Client View Of His/Her Situation: Repors CBG today of 152.  States that he has had 2 episodes when he had been out in the hot weather and then got sweaty, felt weak and that his heart was racing. Reports it happened when carrying in the groceries.  Reports otherwise he is doing great. Went to see a new PCP and he likes the new PCP.  Healthcare Provider Communication: Did Higher education careers adviser With [employer] Provider?: No According to Client, Did PCP Report Communication With An Aging Gracefully Ellis?: No  Clinician View of Client Situation: Clinician View Of Client Situation: Home neat and clean.  Patient happy.  Ambulating well. well dressed Client's View of His/Her Situation: Client View Of His/Her Situation: Repors CBG today of 152.  States that he has had 2 episodes when he had been out in the hot weather and then got sweaty, felt weak and that his heart was racing. Reports it happened when carrying in the groceries.  Reports otherwise he is doing great. Went to see a new PCP and he likes the new PCP.  Medication Assessment: denies any changes    OT Update: Ellis visits completed  Session Summary: Patient doing well. Happy with his home modifications. Reviewed importance of monitor CBG and BP and taking readings to MD.   Goals Addressed             This Visit's Progress    COMPLETED: Aging Gracefully Ellis Goal - Monitor and Manage My Blood Sugar-Diabetes Type 2        Timeframe:  Long-Range Goal Priority:  High Start Date:        09/01/21                     Expected End Date:                      12/11/2021 Assessment:  reviewed A1c from 2 weeks ago to be 7.0   Review CBG ranges are good.  States that he is eating regular meals. Denies any recent low CBG readings.  Interventions: Encouraged patient to self monitor his CBG if he has one of there reported spells. Encouraged patient to write down his CBG readings and take with him the his MD appointments. Plan: Goal met.   Tyler Ellis, BSN, CEN Ellis Case Freight forwarder for [employer]: [phone]  11/01/2021 Assessment:  Patient reports CBG of 123 today.Denies any recent low.Self monitors daily.  Reports that he does not take his medications until he eats.  Occasionally has a decreased appetite.  Reports that he is dong his home exercises and that she stays busy.  Patient very happy with his modifications.  Interventions: assess CBG control and medications. Reviewed pending new PCP appointment. Reviewed importance of follow up and labs. Encouraged patient to take his meter and or readings to MD appointment. Encouraged patient to do his home exercises daily. PLAN: next home visit  11/09/2021  Tyler Ellis, BSN, CEN Ellis Case Manager for [employer]: [phone]   10/09/21 Visit #2 Assessment: Patient reports his blood sugar has been good. He reports one hypoglycemic epidsode, but did not have meter with him and does not know how low it was. He reports had chill bumps, sweaty. He reports he took his medication, but did not eat. Patient states family member took him to [employer] and he got something to eat, then felt better.   Goals:  take medications as prescribed. Make sure you eat after taking your blood sugar medications Check blood sugar as prescribed Follow the Rule of 15 for treatment of hypoglycemia (Blood sugar than 70): STEP  1:   Take 15 grams of carbohydrates when your blood sugar is low, which includes:              3-4 glucose tabs or             3-4 oz of juice or regular soda or             One tube of glucose gel STEP 2:  Recheck blood sugar in 15 minutes STEP 3:  If your blood sugar is still low at the 15 minute recheck ---then, go back to STEP 1 and treat again with another 15 grams of carbohydrates  Interventions: Discussed importance of checking blood sugar before administering medications Discussed importance of eating after taking medications for diabetes Discussed importance of having meter with him so he can check blood sugar is he has any unusual events Provided How to thirive: a guide for your journey with diabetes provided.  review and plan to discuss at next visit.  Tyler Silversmith, RN, MSN, BSN, [place] Management Coordinator [phone]  09/01/21 Initial Visit:  - check blood sugar if I feel it is too high or too low - take the blood sugar meter to all doctor visits    Why is this important?   Checking your blood sugar at home helps to keep it from getting very high or very low.  Writing the results in a diary or log helps the doctor know how to care for you.  Your blood sugar log should have the time, the date and the results.  Also, write down the amount of insulin or other medicine you take.  Other information like what you ate, exercise done and how you were feeling will also be helpful..     Notes:  Client states he is monitoring CBG twice daily.  Education given to monitor blood glucose when he feels signs and symptoms of hyperglycemia and hypoglycemia and manage diet to decrease sugary drinks and simple sugar snacks.  Reviewed signs and symptoms of hyper/hypoglycemia.  Share calendar, notebook and exercises at next visit.       Aging Gracefully Ellis Goal - Track and Manage My Blood Pressure-Hypertension       Timeframe:  Long-Range Goal Priority:  High Start Date:  09/01/21                            Expected End Date:   12/02/21                  12/11/2021  Assessment: Patient reports that he last checked his BP was a week ago and it was normal. Reports that his BP has been good since he cut back on his salt.  Intervention:  Encouraged patient to self monitor BP 3 times per week.  Encouraged patient to write down his readings. Reviewed pending cardiology appointment.   Plan: goal met.   Tyler Ellis, BSN, CEN Ellis Case Freight forwarder for [facility] [employer]: [phone]  10/09/21 Visit #2 Assessment: reports last blood pressure check 140/92. He states he does not use salt in cooking, however states uses a different seasing. Ellis looked at ingredients and first ingrediant was salt.  Interventions: Discussed limiting salt intake. Teaching regarding how to read labels completed ProvidedHigh Salt/Low salt food brochure Provided HTN handout Recommendations to use Ms. Dash, herbs, pepper when seasoning foods Encouraged to continue to monitor and record blood pressure  Tyler Silversmith, RN, MSN, BSN, [place] Management Coordinator [phone]  09/01/21 Initial Visit - choose a place to take my blood pressure (home, clinic or office, retail store) - write blood pressure results in a log or diary    Why is this important?   You won't feel high blood pressure, but it can still hurt your blood vessels.  High blood pressure can cause heart or kidney problems. It can also cause a stroke.  Making lifestyle changes like losing a little weight or eating less salt will help.  Checking your blood pressure at home and at different times of the day can help to control blood pressure.  If the doctor prescribes medicine remember to take it the way the doctor ordered.  Call the office if you cannot afford the medicine or if there are questions about it.     Notes:  Client states his blood pressure has been elevated and he  needs to get it under control.  Education given around diet and exercise.  Client states he doesn't understand how to read labels.  Sodium is sometimes an issue.  He does rinse can vegetables but then puts cured meat in the vegetables to [employer].  States he didn't realize he was adding the sodium back to his food.  Sending sodium education sheet.          Patient has completed all his Ellis home visits.  Tyler Ellis, BSN, Careers information officer for [employer]: [phone]

## 2021-12-19 ENCOUNTER — Encounter: Payer: Self-pay | Admitting: Specialist

## 2021-12-20 ENCOUNTER — Other Ambulatory Visit: Payer: Self-pay | Admitting: Specialist

## 2021-12-20 NOTE — Patient Outreach (Signed)
Aging Gracefully Program  OT FINAL Visit  12/20/2021  Tyler Ellis 1950-12-25 [id number]  Visit:  4- Fourth Visit  Start Time:  1130 End Time:  1200 Total Minutes:  30  Patient Education: Education Provided: Yes Education Details: reviewed how to utilize the tips for aging in place booklet Person(s) Educated: Patient Comprehension: Verbalized Understanding  Goals:  Goals Addressed             This Visit's Progress    Patient Stated   On track    Patient will improve balance while completing grooming tasks at the bathroom sink.  Patient states he becomes fatigued when standing at sink and prefers to lean on vanity.  His vanity was very low and this was causing pain in his back.  We elevated to a comfort level vanity and he now has much less difficulty with grooming and much less pain.         Post Clinical Reasoning: Client Action (Goal) Three Interventions: Patient will improve safety and decrease pain when completing grooming tasks. - CHS elevated his sink vanity to comfort level so that he no longer had to lean over so far to support himself while completing grooming tasks. Targeted Problem Area Status: A Lot Better Clinician View Of Client Situation:: Mr. Stankowski is using his new carpal tunnel splint and states he no longer feels numbness.  His comfort level vanity has been wonderful he states as he no longer has to lean over and hurt his back.  He is enjoying the information we have shared with him and he is completing the exercises given to him by [employer]. Client View Of His/Her Situation:: doing much better, feels better, feels safer.  He feels he has met all of his Aging Gracefully goals. Next Visit Plan:: DC from [facility] this date.  Vangie Bicker, [place], OTR/L [phone]

## 2021-12-26 ENCOUNTER — Ambulatory Visit (HOSPITAL_BASED_OUTPATIENT_CLINIC_OR_DEPARTMENT_OTHER)
Admission: RE | Admit: 2021-12-26 | Discharge: 2021-12-26 | Disposition: A | Discharge status: Home / Self Care | Payer: Medicare Other | Source: Ambulatory Visit | Attending: Medical | Admitting: Medical

## 2021-12-26 ENCOUNTER — Ambulatory Visit (INDEPENDENT_AMBULATORY_CARE_PROVIDER_SITE_OTHER): Payer: Medicare Other | Admitting: Medical

## 2021-12-26 ENCOUNTER — Telehealth (HOSPITAL_BASED_OUTPATIENT_CLINIC_OR_DEPARTMENT_OTHER): Payer: Self-pay

## 2021-12-26 ENCOUNTER — Encounter: Payer: Self-pay | Admitting: Medical

## 2021-12-26 VITALS — BP 155/78 | HR 114 | Resp 18 | Ht 67.0 in | Wt 247.0 lb

## 2021-12-26 DIAGNOSIS — E0590 Thyrotoxicosis, unspecified without thyrotoxic crisis or storm: Secondary | ICD-10-CM

## 2021-12-26 DIAGNOSIS — I480 Paroxysmal atrial fibrillation: Secondary | ICD-10-CM | POA: Diagnosis not present

## 2021-12-26 DIAGNOSIS — E119 Type 2 diabetes mellitus without complications: Secondary | ICD-10-CM | POA: Diagnosis not present

## 2021-12-26 DIAGNOSIS — I10 Essential (primary) hypertension: Secondary | ICD-10-CM

## 2021-12-26 DIAGNOSIS — R600 Localized edema: Secondary | ICD-10-CM

## 2021-12-26 DIAGNOSIS — M79609 Pain in unspecified limb: Secondary | ICD-10-CM | POA: Diagnosis present

## 2021-12-26 LAB — CBC WITH DIFFERENTIAL/PLATELET
Basophils Absolute: 0 10*3/uL (ref 0.0–0.1)
Basophils Relative: 0.4 % (ref 0.0–3.0)
Eosinophils Absolute: 0 10*3/uL (ref 0.0–0.7)
Eosinophils Relative: 0.3 % (ref 0.0–5.0)
HCT: 45 % (ref 39.0–52.0)
Hemoglobin: 15.1 g/dL (ref 13.0–17.0)
Lymphocytes Relative: 21.7 % (ref 12.0–46.0)
Lymphs Abs: 1.5 10*3/uL (ref 0.7–4.0)
MCHC: 33.5 g/dL (ref 30.0–36.0)
MCV: 96.3 fl (ref 78.0–100.0)
Monocytes Absolute: 0.4 10*3/uL (ref 0.1–1.0)
Monocytes Relative: 5.9 % (ref 3.0–12.0)
Neutro Abs: 4.8 10*3/uL (ref 1.4–7.7)
Neutrophils Relative %: 71.7 % (ref 43.0–77.0)
Platelets: 177 10*3/uL (ref 150.0–400.0)
RBC: 4.67 Mil/uL (ref 4.22–5.81)
RDW: 14 % (ref 11.5–15.5)
WBC: 6.7 10*3/uL (ref 4.0–10.5)

## 2021-12-26 LAB — COMPREHENSIVE METABOLIC PANEL
ALT: 35 U/L (ref 0–53)
AST: 21 U/L (ref 0–37)
Albumin: 4.2 g/dL (ref 3.5–5.2)
Alkaline Phosphatase: 77 U/L (ref 39–117)
BUN: 12 mg/dL (ref 6–23)
CO2: 25 mEq/L (ref 19–32)
Calcium: 9.7 mg/dL (ref 8.4–10.5)
Chloride: 104 mEq/L (ref 96–112)
Creatinine, Ser: 1.07 mg/dL (ref 0.40–1.50)
GFR: 70.03 mL/min (ref >60.00)
Glucose, Bld: 187 mg/dL — ABNORMAL HIGH (ref 70–99)
Potassium: 4.2 mEq/L (ref 3.5–5.1)
Sodium: 140 mEq/L (ref 135–145)
Total Bilirubin: 0.4 mg/dL (ref 0.2–1.2)
Total Protein: 6.7 g/dL (ref 6.0–8.3)

## 2021-12-26 LAB — BRAIN NATRIURETIC PEPTIDE: Pro B Natriuretic peptide (BNP): 29 pg/mL (ref 0.0–100.0)

## 2021-12-26 NOTE — Progress Notes (Signed)
Subjective:    Patient ID: Tyler Ellis, male    DOB: 1950-08-22, 71 y.o.   MRN: [id number]  HPI  Pt in for some recent feeling like his feet are swelling. More on left side than on rt side.  Pt states he went to podiatrist last year and another one in past. He states last one told him to soak feet in epson salt.   ASSESSMENT:   1. Right foot pain  2. Ingrown toenail   PLAN:  I discussed with the patient the diagnosis, the imaging findings, and various treatment options. Patient with a on symptomatic right great toenail ingrown nail. We discussed that this does not require any specific treatment at this time. We discussed that there is no signs of infection and certainly no signs of gangrene. If it becomes symptomatic, he will try Epson salt soaks. Additionally, we discussed the surgical treatment of partial nail excision with matricectomy. The patient will follow-up with me as needed.  - Follow-up: prn  Pt is diabetic. 4 weeks his a1c was 7.0.  Pt does have.  1. Coronary artery disease involving native coronary artery of native heart without angina pectoris   2. Paroxysmal atrial fibrillation (HCC)   3. Supraventricular tachycardia (HCC)   4. Essential hypertension   5. Dyslipidemia   6. Palpitations      Pt states he is getting rare intermittent episodes of dyspnea. Example twice in past month got short breath from car to inside. Last event happened 2 weeks ago.  Htn- bp at home was  yesterday 147/76. Most days his bp are in 140 range over 80.      Review of Systems  Constitutional:  Negative for chills and fatigue.  Respiratory:  Positive for shortness of breath. Negative for cough, chest tightness and wheezing.        See hpi  Cardiovascular:  Negative for chest pain and palpitations.  Gastrointestinal:  Negative for diarrhea and nausea.  Genitourinary:  Negative for dysuria and flank pain.  Musculoskeletal:  Negative for back pain.  Skin:  Negative for rash.   Neurological:  Negative for dizziness.  Hematological:  Negative for adenopathy. Does not bruise/bleed easily.  Psychiatric/Behavioral:  Negative for behavioral problems.     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   Enlarged prostate with urinary obstruction 05/11/2020   Essential hypertension 03/11/2017   Hypertension    Hyperthyroidism 06/26/2017   Medication management 06/06/2020   Multinodular goiter 07/03/2017   Nocturia 02/02/2014   NSTEMI (non-ST elevated myocardial infarction) (HCC) 02/27/2017   Palpitations 10/28/2019   Paroxysmal atrial fibrillation (HCC) 05/24/2020   Supraventricular tachycardia (HCC) 02/14/2018   Type 2 diabetes mellitus with complication, without long-term current use of insulin (HCC) 03/11/2017     Social History   Socioeconomic History   Marital status: Divorced    Spouse name: Not on file   Number of children: Not on file   Years of education: Not on file   Highest education level: Not on file  Occupational History   Not on file  Tobacco Use   Smoking status: Never   Smokeless tobacco: Never  Vaping Use   Vaping Use: Never used  Substance and Sexual Activity   Alcohol use: Yes    Comment: weekly  Drug use: No   Sexual activity: Not on file  Other Topics Concern   Not on file  Social History Narrative   Not on file   Social Determinants of Health   Financial Resource Strain: Not on file  Food Insecurity: Not on file  Transportation Needs: Not on file  Physical Activity: Not on file  Stress: Not on file  Social Connections: Not on file  Intimate Partner Violence: Not on file    Past Surgical History:  Procedure Laterality Date   KNEE SURGERY     LEFT HEART CATH AND CORONARY ANGIOGRAPHY N/A 02/28/2017    Procedure: LEFT HEART CATH AND CORONARY ANGIOGRAPHY;  Surgeon: Kathleene Hazel, MD;  Location: [facility];  Service: Cardiovascular;  Laterality: N/A;    Family History  Problem Relation Age of Onset   Hypertension Father    Hypertension Mother     No Known Allergies  Current Outpatient Medications on File Prior to Visit  Medication Sig Dispense Refill   acetaminophen-codeine (TYLENOL #3) 300-30 MG tablet Take 1 tablet every 8 (eight) hours as needed by mouth for moderate pain.      ALPRAZolam (XANAX) 0.5 MG tablet Take 0.25-0.5 mg by mouth 3 (three) times daily as needed for anxiety.     apixaban (ELIQUIS) 5 MG TABS tablet Take 1 tablet (5 mg total) by mouth 2 (two) times daily. 180 tablet 1   atorvastatin (LIPITOR) 80 MG tablet Take 1 tablet (80 mg total) daily by mouth. 30 tablet 3   azithromycin (ZITHROMAX) 250 MG tablet Take 1 tablet (250 mg total) by mouth daily. Take first 2 tablets together, then 1 every day until finished. 6 tablet 0   COVID-19 mRNA bivalent vaccine, Pfizer, injection Inject into the muscle. 0.3 mL 0   diltiazem (CARDIZEM CD) 360 MG 24 hr capsule Take 1 capsule (360 mg total) by mouth daily. 90 capsule 1   esomeprazole (NEXIUM) 20 MG capsule Take 20 mg by mouth daily at 12 noon.     ezetimibe (ZETIA) 10 MG tablet Take 1 tablet (10 mg total) by mouth daily. 90 tablet 2   flecainide (TAMBOCOR) 50 MG tablet Take 1 tablet (50 mg total) by mouth 2 (two) times daily. 60 tablet 5   glipiZIDE (GLUCOTROL) 10 MG tablet Take 10 mg 2 (two) times daily before a meal by mouth.      guaiFENesin (MUCINEX) 600 MG 12 hr tablet Take 600 mg by mouth daily as needed.     HUMULIN N KWIKPEN 100 UNIT/ML Kiwkpen Inject 51 Units into the skin daily.     hydrOXYzine (ATARAX/VISTARIL) 10 MG tablet Take 10 mg by mouth in the morning and at bedtime.     influenza vaccine adjuvanted (FLUAD) 0.5 ML injection Inject into the muscle. 0.5 mL 0   LANTUS SOLOSTAR 100 UNIT/ML Solostar  Pen Inject 50 Units into the skin at bedtime.     losartan (COZAAR) 100 MG tablet Take 100 mg by mouth daily.     losartan (COZAAR) 50 MG tablet TAKE 1 TABLET(50 MG) BY MOUTH DAILY 90 tablet 3   magnesium chloride (SLOW-MAG) 64 MG TBEC SR tablet Take 1 tablet (64 mg total) by mouth 2 (two) times daily. 60 tablet 5   metFORMIN (GLUCOPHAGE) 1000 MG tablet Take 1,000 mg by mouth 2 (two) times daily.     metoprolol (TOPROL-XL) 200 MG 24 hr tablet Take 1 tablet (200 mg total) by mouth daily. 90 tablet 1   potassium  chloride (KLOR-CON) 10 MEQ tablet Take 1 tablet (10 mEq total) by mouth daily. 90 tablet 0   tamsulosin (FLOMAX) 0.4 MG CAPS capsule Take 0.4 mg by mouth daily.     torsemide (DEMADEX) 20 MG tablet Take 1 tablet (20 mg total) by mouth daily. 90 tablet 0   nitroGLYCERIN (NITROSTAT) 0.4 MG SL tablet Place 1 tablet (0.4 mg total) under the tongue every 5 (five) minutes as needed for chest pain. 25 tablet 2   No current facility-administered medications on file prior to visit.    BP (!) 155/78   Pulse (!) 114   Resp 18   Ht 5\' 7"  (1.702 m)   Wt 247 lb (112 kg)   SpO2 96%   BMI 38.69 kg/m        Objective:   Physical Exam General- No acute distress. Pleasant patient. Neck- Full range of motion, no jvd Lungs- Clear, even and unlabored. Heart- regular rate and rhythm. Neurologic- CNII- XII grossly intact.  Rt leg- rt foot and ankle mild swollen. Calf not swollen. Negative homan sign. Left leg- moderate foot swelling, ankle and calf. Slight more than rt and mild + homans sign.   Lower ext- no breakdown or ulcers in fee.    Assessment & Plan:   Coronary disease stable from that point review, on appropriate medication which include lipid-lowering agents. Dyslipidemia I did review K PN from March of this year which show HDL of 37 LDL 86, will check his fasting lipid profile today.  He is on high intense statin in form of Lipitor 80 as well as Zetia. Paroxysmal atrial fibrillation  seems to be doing well from that point review.  Denies having any chest pain tightness squeezing pressure burning chest, rare palpitations.  He is on flecainide and anticoagulation which I will continue Supraventricular tachycardia rare occurrences of short lasting palpitations.  He is perfectly fine with it does not want to do anything about Essential hypertension elevated today in the office but he tells me it is always good at home.  We will continue monitoring.  We did talk about avoidance of salty foods and I given some instructions how to do it, we did talk about need to exercise and regular basis which she is trying to do. .   For sporadic episodes of shortness of breath and some bilateral pedal edema will get cxr, cmp and bnp. 6. Left leg more swollen than rt with some fullness in popliteal area and mild pain. Will get left lower ext ultrasound. 7. Follow up 7 days or sooner if needed.  April, PA-C    Time spent with patient today was  40 minutes which consisted of chart revdiew, discussing diagnosis, work up treatment and documentation.

## 2021-12-26 NOTE — Patient Instructions (Addendum)
    CAD- continue Lipitor 80 as well as Zetia. Paroxysmal atrial fibrillation  On flecainide and anticoagulation which I will continue. Also he is on demadex. Supraventricular tachycardia rare occurrences of short lasting palpitations.  He is perfectly fine with it does not want to do anything about. On diltiazem as well.  Essential hypertension elevated today in the office but better at home.  We will continue monitoring.  We did talk about avoidance of salty foods and I given some instructions how to do it, we did talk about need to exercise and regular basis which she is trying to do. Check blood pressure daily and let me know readings in one week. Stay on losartan and metoprol as well. If bp level never in 130 range will get opinion from Dr. Tanna Furry if you will go back to former higher dose losartan 100 mg daily. 5.   For sporadic episodes of shortness of breath and some bilateral pedal edema will get cxr, cmp and bnp. 6. Left leg more swollen than rt with some fullness in popliteal area and mild pain. Will get left lower ext ultrasound. 7. Follow up 7 days or sooner if needed.

## 2021-12-28 ENCOUNTER — Ambulatory Visit: Payer: Medicare Other | Attending: Cardiology

## 2021-12-28 ENCOUNTER — Encounter: Payer: Self-pay | Admitting: Cardiology

## 2021-12-28 ENCOUNTER — Ambulatory Visit: Payer: Medicare Other | Attending: Cardiology | Admitting: Cardiology

## 2021-12-28 VITALS — BP 184/98 | HR 120 | Ht 67.0 in | Wt 247.0 lb

## 2021-12-28 DIAGNOSIS — I2510 Atherosclerotic heart disease of native coronary artery without angina pectoris: Secondary | ICD-10-CM | POA: Diagnosis not present

## 2021-12-28 DIAGNOSIS — I471 Supraventricular tachycardia: Secondary | ICD-10-CM | POA: Diagnosis not present

## 2021-12-28 DIAGNOSIS — I10 Essential (primary) hypertension: Secondary | ICD-10-CM | POA: Diagnosis not present

## 2021-12-28 DIAGNOSIS — I480 Paroxysmal atrial fibrillation: Secondary | ICD-10-CM

## 2021-12-28 DIAGNOSIS — E119 Type 2 diabetes mellitus without complications: Secondary | ICD-10-CM

## 2021-12-28 DIAGNOSIS — E785 Hyperlipidemia, unspecified: Secondary | ICD-10-CM

## 2021-12-28 MED ORDER — CLONIDINE HCL 0.1 MG PO TABS: 0.1000 mg | ORAL_TABLET | Freq: Every day | ORAL | 3 refills | Status: DC

## 2021-12-28 NOTE — Progress Notes (Unsigned)
Cardiology Office Note:    Date:  12/28/2021   ID:  Tyler Ellis, DOB April 17, 1951, MRN [id number]  PCP:  Esperanza Richters, PA-C  Cardiologist:  Gypsy Balsam, MD    Referring MD: Forrest Moron, MD   Chief Complaint  Patient presents with   Tachycardia    Ongoing for 1 month    History of Present Illness:    Tyler Ellis is a 71 y.o. male with past medical history significant for coronary artery disease, in 2019 he had cardiac catheterization done which showed 10% of proximal 10% of mid RCA lesion, since that time he had a stress test which was negative, paroxysmal atrial fibrillation managed with flecainide as well as anticoagulation, essential hypertension, diabetes, dyslipidemia. He is in my office today for follow-up.  Overall he is doing great but he described to have some episode of tachycardia it seems that it happened abruptly usually he does some effort.  Last for few minutes when it happened he usually sweats but does not have any chest pain tightness squeezing pressure burning chest.  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   Enlarged prostate with urinary obstruction 05/11/2020   Essential hypertension 03/11/2017   Hypertension    Hyperthyroidism 06/26/2017   Medication management 06/06/2020   Multinodular goiter 07/03/2017   Nocturia 02/02/2014   NSTEMI (non-ST elevated myocardial infarction) (HCC) 02/27/2017   Palpitations 10/28/2019   Paroxysmal atrial fibrillation (HCC) 05/24/2020   Supraventricular tachycardia (HCC) 02/14/2018   Type 2 diabetes mellitus with complication, without long-term current use of insulin (HCC) 03/11/2017    Past Surgical History:  Procedure Laterality Date   KNEE SURGERY     LEFT HEART CATH AND CORONARY ANGIOGRAPHY N/A  02/28/2017   Procedure: LEFT HEART CATH AND CORONARY ANGIOGRAPHY;  Surgeon: Kathleene Hazel, MD;  Location: [facility];  Service: Cardiovascular;  Laterality: N/A;    Current Medications: Current Meds  Medication Sig   acetaminophen-codeine (TYLENOL #3) 300-30 MG tablet Take 1 tablet every 8 (eight) hours as needed by mouth for moderate pain.    ALPRAZolam (XANAX) 0.5 MG tablet Take 0.25-0.5 mg by mouth 3 (three) times daily as needed for anxiety.   apixaban (ELIQUIS) 5 MG TABS tablet Take 1 tablet (5 mg total) by mouth 2 (two) times daily.   atorvastatin (LIPITOR) 80 MG tablet Take 1 tablet (80 mg total) daily by mouth.   diltiazem (CARDIZEM CD) 360 MG 24 hr capsule Take 1 capsule (360 mg total) by mouth daily.   esomeprazole (NEXIUM) 20 MG capsule Take 20 mg by mouth daily at 12 noon.   ezetimibe (ZETIA) 10 MG tablet Take 1 tablet (10 mg total) by mouth daily.   flecainide (TAMBOCOR) 50 MG tablet Take 1 tablet (50 mg total) by mouth 2 (two) times daily.   glipiZIDE (GLUCOTROL) 10 MG tablet Take 10 mg 2 (two) times daily before a meal by mouth.    guaiFENesin (MUCINEX) 600 MG 12 hr tablet Take 600 mg by mouth daily as needed for to loosen phlegm or cough.   hydrOXYzine (ATARAX/VISTARIL) 10 MG tablet Take 10 mg by mouth in the morning and at bedtime.   influenza vaccine adjuvanted (FLUAD) 0.5 ML injection Inject into the muscle. (Patient taking differently: Inject 0.5 mLs into the  muscle tomorrow at 10 am.)   LANTUS SOLOSTAR 100 UNIT/ML Solostar Pen Inject 50 Units into the skin at bedtime.   losartan (COZAAR) 50 MG tablet TAKE 1 TABLET(50 MG) BY MOUTH DAILY (Patient taking differently: Take 100 mg by mouth daily.)   metFORMIN (GLUCOPHAGE) 1000 MG tablet Take 1,000 mg by mouth 2 (two) times daily.   metoprolol (TOPROL-XL) 200 MG 24 hr tablet Take 1 tablet (200 mg total) by mouth daily.   nitroGLYCERIN (NITROSTAT) 0.4 MG SL tablet Place 1 tablet (0.4 mg total) under the tongue  every 5 (five) minutes as needed for chest pain.   OVER THE COUNTER MEDICATION Take 1 tablet by mouth daily. Beta Prostate   potassium chloride (KLOR-CON) 10 MEQ tablet Take 1 tablet (10 mEq total) by mouth daily.   tamsulosin (FLOMAX) 0.4 MG CAPS capsule Take 0.4 mg by mouth daily.   torsemide (DEMADEX) 20 MG tablet Take 1 tablet (20 mg total) by mouth daily.   [DISCONTINUED] azithromycin (ZITHROMAX) 250 MG tablet Take 1 tablet (250 mg total) by mouth daily. Take first 2 tablets together, then 1 every day until finished.   [DISCONTINUED] COVID-19 mRNA bivalent vaccine, Pfizer, injection Inject into the muscle. (Patient taking differently: Inject 0.3 mLs into the muscle once.)   [DISCONTINUED] HUMULIN N KWIKPEN 100 UNIT/ML Kiwkpen Inject 51 Units into the skin daily.   [DISCONTINUED] magnesium chloride (SLOW-MAG) 64 MG TBEC SR tablet Take 1 tablet (64 mg total) by mouth 2 (two) times daily.     Allergies:   Patient has no known allergies.   Social History   Socioeconomic History   Marital status: Divorced    Spouse name: Not on file   Number of children: Not on file   Years of education: Not on file   Highest education level: Not on file  Occupational History   Not on file  Tobacco Use   Smoking status: Never   Smokeless tobacco: Never  Vaping Use   Vaping Use: Never used  Substance and Sexual Activity   Alcohol use: Yes    Comment: weekly   Drug use: No   Sexual activity: Not on file  Other Topics Concern   Not on file  Social History Narrative   Not on file   Social Determinants of Health   Financial Resource Strain: Not on file  Food Insecurity: Not on file  Transportation Needs: Not on file  Physical Activity: Not on file  Stress: Not on file  Social Connections: Not on file     Family History: The patient's family history includes Hypertension in his father and mother. ROS:   Please see the history of present illness.    All 14 point review of systems negative  except as described per history of present illness  EKGs/Labs/Other Studies Reviewed:      Recent Labs: 11/27/2021: TSH 0.78 12/26/2021: ALT 35; BUN 12; Creatinine, Ser 1.07; Hemoglobin 15.1; Platelets 177.0; Potassium 4.2; Pro B Natriuretic peptide (BNP) 29.0; Sodium 140  Recent Lipid Panel    Component Value Date/Time   CHOL 114 11/27/2021 0827   CHOL 118 03/23/2021 1515   TRIG 112.0 11/27/2021 0827   HDL 38.70 (L) 11/27/2021 0827   HDL 43 03/23/2021 1515   CHOLHDL 3 11/27/2021 0827   VLDL 22.4 11/27/2021 0827   LDLCALC 53 11/27/2021 0827   LDLCALC 63 03/23/2021 1515    Physical Exam:    VS:  BP (!) 184/98 (BP Location: Left Arm, Patient Position: Sitting)  Pulse (!) 120   Ht 5\' 7"  (1.702 m)   Wt 247 lb (112 kg)   SpO2 92%   BMI 38.69 kg/m     Wt Readings from Last 3 Encounters:  12/28/21 247 lb (112 kg)  12/26/21 247 lb (112 kg)  11/08/21 247 lb 3.2 oz (112.1 kg)     GEN:  Well nourished, well developed in no acute distress HEENT: Normal NECK: No JVD; No carotid bruits LYMPHATICS: No lymphadenopathy CARDIAC: RRR, no murmurs, no rubs, no gallops RESPIRATORY:  Clear to auscultation without rales, wheezing or rhonchi  ABDOMEN: Soft, non-tender, non-distended MUSCULOSKELETAL:  No edema; No deformity  SKIN: Warm and dry LOWER EXTREMITIES: no swelling NEUROLOGIC:  Alert and oriented x 3 PSYCHIATRIC:  Normal affect   ASSESSMENT:    1. Coronary artery disease involving native coronary artery of native heart without angina pectoris   2. Essential hypertension   3. Paroxysmal atrial fibrillation (HCC)   4. Supraventricular tachycardia (HCC)   5. Diabetes mellitus without complication (HCC)   6. Dyslipidemia    PLAN:    In order of problems listed above:  Coronary artery disease stable from that point review we will continue present management Essential hypertension, uncontrolled, I will add clonidine 0.1 mg daily to his medical regimen. Paroxysmal atrial  fibrillation anticoagulation as well as flecainide.  I will ask him to wear a monitor to see exactly what kind of arrhythmia he have when he complains of having palpitations.  We will continue anticoagulation. Diabetes mellitus to be followed by internal medicine team.  Apparently stable I do have last hemoglobin A1c which is 7.0 this is from 11/27/2021. Dyslipidemia, I did review K PN data from 11/27/2021 show LDL 53 HDL 38 good cholesterol profile, he is on Lipitor 80 which is high intense statin I will continue. Dyspnea on exertion I will ask him to have an echocardiogram done Concerns today about his arrhythmia monitor will be placed he will wear Zio patch for 2 weeks, [facility] his blood pressure which is poorly controlled plan as described above   Medication Adjustments/Labs and Tests Ordered: Current medicines are reviewed at length with the patient today.  Concerns regarding medicines are outlined above.  No orders of the defined types were placed in this encounter.  Medication changes: No orders of the defined types were placed in this encounter.   Signed, 01/27/2022, MD, [facility] 12/28/2021 8:45 AM    [facility]

## 2021-12-28 NOTE — Patient Instructions (Addendum)
Medication Instructions:  Your physician has recommended you make the following change in your medication:   START: Clonidine 0.1mg  1 daily by mouth    Lab Work: None Ordered If you have labs (blood work) drawn today and your tests are completely normal, you will receive your results only by: MyChart Message (if you have MyChart) OR A paper copy in the mail If you have any lab test that is abnormal or we need to change your treatment, we will call you to review the results.   Testing/Procedures: Your physician has requested that you have an echocardiogram. Echocardiography is a painless test that uses sound waves to create images of your heart. It provides your doctor with information about the size and shape of your heart and how well your heart's chambers and valves are working. This procedure takes approximately one hour. There are no restrictions for this procedure.    WHY IS MY DOCTOR PRESCRIBING ZIO? The Zio system is proven and trusted by physicians to detect and diagnose irregular heart rhythms -- and has been prescribed to hundreds of thousands of patients.  The FDA has cleared the Zio system to monitor for many different kinds of irregular heart rhythms. In a study, physicians were able to reach a diagnosis 90% of the time with the Zio system1.  You can wear the Zio monitor -- a small, discreet, comfortable patch -- during your normal day-to-day activity, including while you sleep, shower, and exercise, while it records every single heartbeat for analysis.  1Barrett, P., et al. Comparison of 24 Hour Holter Monitoring Versus 14 Day Novel Adhesive Patch Electrocardiographic Monitoring. American Journal of Medicine, 2014.  ZIO VS. HOLTER MONITORING The Zio monitor can be comfortably worn for up to 14 days. Holter monitors can be worn for 24 to 48 hours, limiting the time to record any irregular heart rhythms you may have. Zio is able to capture data for the 51% of patients who  have their first symptom-triggered arrhythmia after 48 hours.1  LIVE WITHOUT RESTRICTIONS The Zio ambulatory cardiac monitor is a small, unobtrusive, and water-resistant patch--you might even forget you're wearing it. The Zio monitor records and stores every beat of your heart, whether you're sleeping, working out, or showering.     Follow-Up: At [facility], you and your health needs are our priority.  As part of our continuing mission to provide you with exceptional heart care, we have created designated Provider Care Teams.  These Care Teams include your primary Cardiologist (physician) and Advanced Practice Providers (APPs -  Physician Assistants and Nurse Practitioners) who all work together to provide you with the care you need, when you need it.  We recommend signing up for the patient portal called "MyChart".  Sign up information is provided on this After Visit Summary.  MyChart is used to connect with patients for Virtual Visits (Telemedicine).  Patients are able to view lab/test results, encounter notes, upcoming appointments, etc.  Non-urgent messages can be sent to your provider as well.   To learn more about what you can do with MyChart, go to [email or web address].    Your next appointment:   3 month(s)  The format for your next appointment:   In Person  Provider:   Gypsy Balsam, MD    Other Instructions NA

## 2022-01-01 ENCOUNTER — Telehealth: Payer: Self-pay | Admitting: Cardiology

## 2022-01-01 NOTE — Telephone Encounter (Signed)
Spoke with pt. He thought he had already had an Echocardiogram. He said he got it straightened out. He had it last year. He will have another one on 01-04-22. He had no further questions.

## 2022-01-01 NOTE — Telephone Encounter (Signed)
Pt calling stating he has already had an echo recently and does not need another one, which is scheduled for 01/04/22. Informed him I dont see any records of an echo since 06/17/20. Please advise.

## 2022-01-02 ENCOUNTER — Encounter: Payer: Self-pay | Admitting: Medical

## 2022-01-02 ENCOUNTER — Ambulatory Visit (INDEPENDENT_AMBULATORY_CARE_PROVIDER_SITE_OTHER): Payer: Medicare Other | Admitting: Medical

## 2022-01-02 VITALS — BP 170/80 | HR 108 | Temp 97.6°F | Resp 18 | Ht 67.0 in | Wt 251.0 lb

## 2022-01-02 DIAGNOSIS — R300 Dysuria: Secondary | ICD-10-CM

## 2022-01-02 DIAGNOSIS — R350 Frequency of micturition: Secondary | ICD-10-CM

## 2022-01-02 DIAGNOSIS — I10 Essential (primary) hypertension: Secondary | ICD-10-CM | POA: Diagnosis not present

## 2022-01-02 DIAGNOSIS — E118 Type 2 diabetes mellitus with unspecified complications: Secondary | ICD-10-CM

## 2022-01-02 MED ORDER — ATORVASTATIN CALCIUM 80 MG PO TABS
80.0000 mg | ORAL_TABLET | Freq: Every day | ORAL | 3 refills | Status: DC
Start: 2022-01-02 — End: 2022-04-30

## 2022-01-02 MED ORDER — METOPROLOL SUCCINATE ER 200 MG PO TB24
200.0000 mg | ORAL_TABLET | Freq: Every day | ORAL | 1 refills | Status: DC
Start: 2022-01-02 — End: 2022-11-12

## 2022-01-02 NOTE — Patient Instructions (Addendum)
Htn, paroxysmal atrial fibrillation and CAD.   For htn and PAF continue diltiazem cd 360 mg daily, losartan 50 mg daily, flecainide 50 mg twice daily and metoprolol 200 mg xl 1 tab po q day.  Dr. Dewayne Shorter added clonidine 0.1 mg daily just recently. He did take bp today at 8-9 am.   Not sure why you pharmacist declined to refill metoprolol prescription? Will send rx in again.  For high cholesterol continue atorvastatin 80 mg po q day and zetia 10 mg q day.   Your zio patch is not sticking to your chest. Recommend you notify Dr. Dewayne Shorter office today or tomorrow.  Diabetes- a1c was 7.0 recently. On metformin 1000 mg twice daily and on glipizide 10 mg daily.  Dysuria- pain on urination for 10 days. Urine looks orange in color. Will get ua, urine culture and psa. Will decide on antibiotic pending lab results.  Follow up in 3 weeks or sooner if needed.

## 2022-01-02 NOTE — Progress Notes (Signed)
Subjective:    Patient ID: Tyler Ellis, male    DOB: Mar 15, 1951, 71 y.o.   MRN: [id number]  HPI Pt in for follow up.  Pt has htn. Pt states  cardiologist did add on bp med to his regimen.   " Coronary artery disease stable from that point review we will continue present management Essential hypertension, uncontrolled, I will add clonidine 0.1 mg daily to his medical regimen. Paroxysmal atrial fibrillation anticoagulation as well as flecainide.  I will ask him to wear a monitor to see exactly what kind of arrhythmia he have when he complains of having palpitations.  We will continue anticoagulation. Diabetes mellitus to be followed by internal medicine team.  Apparently stable I do have last hemoglobin A1c which is 7.0 this is from 11/27/2021. Dyslipidemia, I did review K PN data from 11/27/2021 show LDL 53 HDL 38 good cholesterol profile, he is on Lipitor 80 which is high intense statin I will continue. Dyspnea on exertion I will ask him to have an echocardiogram done Concerns today about his arrhythmia monitor will be placed he will wear Zio patch for 2 weeks, [facility] his blood pressure which is poorly controlled plan as described above."  Pt will also get echocardiogram on Thursday.   Recently he states atorvastatin and metoprolol declined by pharmacy.   Review of Systems  Constitutional:  Negative for chills, fatigue and fever.  HENT:  Negative for congestion and drooling.   Respiratory:  Negative for cough, chest tightness, shortness of breath and wheezing.   Cardiovascular:  Negative for chest pain and palpitations.  Gastrointestinal:  Negative for abdominal distention, abdominal pain, blood in stool, constipation, diarrhea and nausea.  Genitourinary:  Negative for dysuria, frequency, genital sores and hematuria.  Musculoskeletal:  Negative for back pain, joint swelling and neck pain.  Skin:  Negative for rash.  Neurological:  Negative for dizziness, light-headedness and  headaches.  Hematological:  Negative for adenopathy. Does not bruise/bleed easily.  Psychiatric/Behavioral:  Negative for behavioral problems and dysphoric mood.     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   Enlarged prostate with urinary obstruction 05/11/2020   Essential hypertension 03/11/2017   Hypertension    Hyperthyroidism 06/26/2017   Medication management 06/06/2020   Multinodular goiter 07/03/2017   Nocturia 02/02/2014   NSTEMI (non-ST elevated myocardial infarction) (HCC) 02/27/2017   Palpitations 10/28/2019   Paroxysmal atrial fibrillation (HCC) 05/24/2020   Supraventricular tachycardia (HCC) 02/14/2018   Type 2 diabetes mellitus with complication, without long-term current use of insulin (HCC) 03/11/2017     Social History   Socioeconomic History   Marital status: Divorced    Spouse name: Not on file   Number of children: Not on file   Years of education: Not on file   Highest education level: Not on file  Occupational History   Not on file  Tobacco Use   Smoking status: Never   Smokeless tobacco: Never  Vaping Use   Vaping Use: Never used  Substance and Sexual Activity   Alcohol use: Yes    Comment: weekly   Drug use: No   Sexual activity: Not on file  Other Topics Concern   Not on file  Social History Narrative   Not on file   Social Determinants  of Health   Financial Resource Strain: Not on file  Food Insecurity: Not on file  Transportation Needs: Not on file  Physical Activity: Not on file  Stress: Not on file  Social Connections: Not on file  Intimate Partner Violence: Not on file    Past Surgical History:  Procedure Laterality Date   KNEE SURGERY     LEFT HEART CATH AND CORONARY ANGIOGRAPHY N/A 02/28/2017   Procedure: LEFT  HEART CATH AND CORONARY ANGIOGRAPHY;  Surgeon: Kathleene Hazel, MD;  Location: [facility];  Service: Cardiovascular;  Laterality: N/A;    Family History  Problem Relation Age of Onset   Hypertension Father    Hypertension Mother     No Known Allergies  Current Outpatient Medications on File Prior to Visit  Medication Sig Dispense Refill   acetaminophen-codeine (TYLENOL #3) 300-30 MG tablet Take 1 tablet every 8 (eight) hours as needed by mouth for moderate pain.      ALPRAZolam (XANAX) 0.5 MG tablet Take 0.25-0.5 mg by mouth 3 (three) times daily as needed for anxiety.     apixaban (ELIQUIS) 5 MG TABS tablet Take 1 tablet (5 mg total) by mouth 2 (two) times daily. 180 tablet 1   atorvastatin (LIPITOR) 80 MG tablet Take 1 tablet (80 mg total) daily by mouth. 30 tablet 3   cloNIDine (CATAPRES) 0.1 MG tablet Take 1 tablet (0.1 mg total) by mouth daily. 90 tablet 3   diltiazem (CARDIZEM CD) 360 MG 24 hr capsule Take 1 capsule (360 mg total) by mouth daily. 90 capsule 1   esomeprazole (NEXIUM) 20 MG capsule Take 20 mg by mouth daily at 12 noon.     ezetimibe (ZETIA) 10 MG tablet Take 1 tablet (10 mg total) by mouth daily. 90 tablet 2   flecainide (TAMBOCOR) 50 MG tablet Take 1 tablet (50 mg total) by mouth 2 (two) times daily. 60 tablet 5   glipiZIDE (GLUCOTROL) 10 MG tablet Take 10 mg 2 (two) times daily before a meal by mouth.      guaiFENesin (MUCINEX) 600 MG 12 hr tablet Take 600 mg by mouth daily as needed for to loosen phlegm or cough.     hydrOXYzine (ATARAX/VISTARIL) 10 MG tablet Take 10 mg by mouth in the morning and at bedtime.     LANTUS SOLOSTAR 100 UNIT/ML Solostar Pen Inject 50 Units into the skin at bedtime.     losartan (COZAAR) 50 MG tablet TAKE 1 TABLET(50 MG) BY MOUTH DAILY (Patient taking differently: Take 100 mg by mouth daily.) 90 tablet 3   metFORMIN (GLUCOPHAGE) 1000 MG tablet Take 1,000 mg by mouth 2 (two) times daily.     metoprolol (TOPROL-XL) 200 MG  24 hr tablet Take 1 tablet (200 mg total) by mouth daily. 90 tablet 1   OVER THE COUNTER MEDICATION Take 1 tablet by mouth daily. Beta Prostate     potassium chloride (KLOR-CON) 10 MEQ tablet Take 1 tablet (10 mEq total) by mouth daily. 90 tablet 0   tamsulosin (FLOMAX) 0.4 MG CAPS capsule Take 0.4 mg by mouth daily.     torsemide (DEMADEX) 20 MG tablet Take 1 tablet (20 mg total) by mouth daily. 90 tablet 0   nitroGLYCERIN (NITROSTAT) 0.4 MG SL tablet Place 1 tablet (0.4 mg total) under the tongue every 5 (five) minutes as needed for chest pain. 25 tablet 2   No current facility-administered medications on file prior to visit.    BP (!) 177/83 (BP Location:  Left Arm, Patient Position: Sitting, Cuff Size: Normal)   Pulse (!) 108   Temp 97.6 F (36.4 C) (Temporal)   Resp 18   Ht 5\' 7"  (1.702 m)   Wt 251 lb (113.9 kg)   SpO2 96%   BMI 39.31 kg/m        Objective:   Physical Exam General- No acute distress. Pleasant patient. Neck- Full range of motion, no jvd Lungs- Clear, even and unlabored. Heart- regular rate and rhythm. Neurologic- CNII- XII grossly intact.  Back- no cva tenderness.      Assessment & Plan:   Patient Instructions  Htn, paroxysmal atrial fibrillation and CAD.   For htn and PAF continue diltiazem cd 360 mg daily, losartan 50 mg daily, flecainide 50 mg twice daily and metoprolol 200 mg xl 1 tab po q day.  Dr. added clonidine 0.1 mg daily just recently. He did take bp today at 8-9 am.   Not sure why you pharmacist declined to refill metoprolol prescription? Will send rx in again.  For high cholesterol continue atorvastatin 80 mg po q day and zetia 10 mg q day.   Your zio patch is not sticking to your chest. Recommend you notify Dr. Dewayne Shorter office today or tomorrow.  Diabetes- a1c was 7.0 recently. On metformin 1000 mg twice daily and on glipizide 10 mg daily.  Dysuria- pain on urination for 10 days. Urine looks orange in color. Will get ua,  urine culture and psa. Will decide on antibiotic pending lab results.  Follow up in 3 weeks or sooner if needed.     Dewayne Shorter, PA-C

## 2022-01-03 LAB — URINALYSIS, ROUTINE W REFLEX MICROSCOPIC
Bilirubin Urine: NEGATIVE
Ketones, ur: NEGATIVE
Leukocytes,Ua: NEGATIVE
Nitrite: NEGATIVE
RBC / HPF: NONE SEEN (ref 0–?)
Specific Gravity, Urine: 1.025 (ref 1.000–1.030)
Total Protein, Urine: 100 — AB
Urine Glucose: >=1000 — AB
Urobilinogen, UA: 0.2 (ref 0.0–1.0)
WBC, UA: NONE SEEN (ref 0–?)
pH: 5.5 (ref 5.0–8.0)

## 2022-01-03 LAB — CBC WITH DIFFERENTIAL/PLATELET
Basophils Absolute: 0.1 10*3/uL (ref 0.0–0.1)
Basophils Relative: 0.9 % (ref 0.0–3.0)
Eosinophils Absolute: 0 10*3/uL (ref 0.0–0.7)
Eosinophils Relative: 0.3 % (ref 0.0–5.0)
HCT: 45.6 % (ref 39.0–52.0)
Hemoglobin: 15.2 g/dL (ref 13.0–17.0)
Lymphocytes Relative: 21.2 % (ref 12.0–46.0)
Lymphs Abs: 1.3 10*3/uL (ref 0.7–4.0)
MCHC: 33.4 g/dL (ref 30.0–36.0)
MCV: 97.1 fl (ref 78.0–100.0)
Monocytes Absolute: 0.4 10*3/uL (ref 0.1–1.0)
Monocytes Relative: 7 % (ref 3.0–12.0)
Neutro Abs: 4.3 10*3/uL (ref 1.4–7.7)
Neutrophils Relative %: 70.6 % (ref 43.0–77.0)
Platelets: 177 10*3/uL (ref 150.0–400.0)
RBC: 4.7 Mil/uL (ref 4.22–5.81)
RDW: 14 % (ref 11.5–15.5)
WBC: 6.1 10*3/uL (ref 4.0–10.5)

## 2022-01-03 LAB — PSA: PSA: 3.88 ng/mL (ref 0.10–4.00)

## 2022-01-03 LAB — URINE CULTURE
MICRO NUMBER:: 13905642
SPECIMEN QUALITY:: ADEQUATE

## 2022-01-03 MED ORDER — SULFAMETHOXAZOLE-TRIMETHOPRIM 800-160 MG PO TABS: 1.0000 | ORAL_TABLET | Freq: Two times a day (BID) | ORAL | 0 refills | Status: DC

## 2022-01-03 NOTE — Addendum Note (Signed)
Addended by: Gwenevere Abbot on: 01/03/2022 01:04 PM   Modules accepted: Orders

## 2022-01-04 ENCOUNTER — Ambulatory Visit (HOSPITAL_BASED_OUTPATIENT_CLINIC_OR_DEPARTMENT_OTHER)
Admission: RE | Admit: 2022-01-04 | Discharge: 2022-01-04 | Disposition: A | Discharge status: Home / Self Care | Payer: Medicare Other | Source: Ambulatory Visit | Attending: Cardiology | Admitting: Cardiology

## 2022-01-04 DIAGNOSIS — I2510 Atherosclerotic heart disease of native coronary artery without angina pectoris: Secondary | ICD-10-CM | POA: Insufficient documentation

## 2022-01-04 DIAGNOSIS — I480 Paroxysmal atrial fibrillation: Secondary | ICD-10-CM | POA: Insufficient documentation

## 2022-01-04 DIAGNOSIS — I471 Supraventricular tachycardia: Secondary | ICD-10-CM | POA: Insufficient documentation

## 2022-01-04 DIAGNOSIS — I10 Essential (primary) hypertension: Secondary | ICD-10-CM | POA: Diagnosis present

## 2022-01-04 DIAGNOSIS — R0609 Other forms of dyspnea: Secondary | ICD-10-CM

## 2022-01-04 NOTE — Progress Notes (Signed)
  Echocardiogram 2D Echocardiogram has been performed.  Tyler Ellis F 01/04/2022, 11:42 AM

## 2022-01-05 LAB — ECHOCARDIOGRAM COMPLETE
AR max vel: 2.14 cm2
AV Area VTI: 2.12 cm2
AV Area mean vel: 2.07 cm2
AV Mean grad: 8 mmHg
AV Peak grad: 13.7 mmHg
Ao pk vel: 1.85 m/s
Area-P 1/2: 4.29 cm2
S' Lateral: 2.5 cm

## 2022-01-11 ENCOUNTER — Telehealth: Payer: Self-pay | Admitting: Cardiology

## 2022-01-11 NOTE — Telephone Encounter (Signed)
Echocardiogram showed preserved left ventricle ejection fraction, some diastolic dysfunction left atrium enlarged.  All of this medical therapy   Results reviewed with pt as per Dr. Wendy Poet note.  Pt verbalized understanding and had no additional questions. Routed to PCP

## 2022-01-11 NOTE — Telephone Encounter (Signed)
Patient returning call for echo results. 

## 2022-01-23 ENCOUNTER — Ambulatory Visit (INDEPENDENT_AMBULATORY_CARE_PROVIDER_SITE_OTHER): Payer: Medicare Other | Admitting: Medical

## 2022-01-23 VITALS — BP 136/70 | HR 100 | Resp 18 | Ht 67.0 in | Wt 250.0 lb

## 2022-01-23 DIAGNOSIS — R350 Frequency of micturition: Secondary | ICD-10-CM | POA: Diagnosis not present

## 2022-01-23 DIAGNOSIS — N401 Enlarged prostate with lower urinary tract symptoms: Secondary | ICD-10-CM | POA: Diagnosis not present

## 2022-01-23 NOTE — Progress Notes (Signed)
Subjective:    Patient ID: Tyler Ellis, male    DOB: Sep 09, 1950, 71 y.o.   MRN: [id number]  HPI  Pt in for follow up. Hpi in " from last visit below.  For htn and PAF continue diltiazem cd 360 mg daily, losartan 50 mg daily, flecainide 50 mg twice daily and metoprolol 200 mg xl 1 tab po q day.  Dr. Dewayne Shorter added clonidine 0.1 mg daily just recently. He did take bp today at 8-9 am.    Not sure why you pharmacist declined to refill metoprolol prescription? Will send rx in again.   For high cholesterol continue atorvastatin 80 mg po q day and zetia 10 mg q day.    Your zio patch is not sticking to your chest. Recommend you notify Dr. Dewayne Shorter office today or tomorrow.   Diabetes- a1c was 7.0 recently. On metformin 1000 mg twice daily and on glipizide 10 mg daily.   Dysuria- pain on urination for 10 days. Urine looks orange in color. Will get ua, urine culture and psa. Will decide on antibiotic pending lab results.  Htn- bp much improved. Pt had not been on metoprolol and added that back. Bp is improved.   Pt states sugars have been controlled. States recently his sugar came down to 109. Pt checks his sugars around twice a day.   Pt still having some occasional frequent urination. But not as much as before. Pt states super beta prostate seems to help some. Pt is on flomax..      Review of Systems  Constitutional:  Negative for chills, fatigue and fever.  HENT:  Negative for congestion and drooling.   Respiratory:  Negative for cough, chest tightness, shortness of breath and wheezing.   Cardiovascular:  Negative for chest pain and palpitations.  Gastrointestinal:  Negative for abdominal distention, abdominal pain, blood in stool, constipation, diarrhea and nausea.  Genitourinary:  Negative for dysuria, frequency, genital sores and hematuria.  Musculoskeletal:  Negative for back pain, joint swelling and neck pain.  Skin:  Negative for rash.  Neurological:  Negative for  dizziness, light-headedness and headaches.  Hematological:  Negative for adenopathy. Does not bruise/bleed easily.  Psychiatric/Behavioral:  Negative for behavioral problems and dysphoric mood.     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   Enlarged prostate with urinary obstruction 05/11/2020   Essential hypertension 03/11/2017   Hypertension    Hyperthyroidism 06/26/2017   Medication management 06/06/2020   Multinodular goiter 07/03/2017   Nocturia 02/02/2014   NSTEMI (non-ST elevated myocardial infarction) (HCC) 02/27/2017   Palpitations 10/28/2019   Paroxysmal atrial fibrillation (HCC) 05/24/2020   Supraventricular tachycardia (HCC) 02/14/2018   Type 2 diabetes mellitus with complication, without long-term current use of insulin (HCC) 03/11/2017     Social History   Socioeconomic History   Marital status: Divorced    Spouse name: Not on file   Number of children: Not on file   Years of education: Not on file   Highest education level: Not on file  Occupational History   Not on file  Tobacco Use   Smoking status: Never   Smokeless tobacco: Never  Vaping Use   Vaping Use: Never used  Substance and Sexual Activity   Alcohol use: Yes    Comment: weekly  Drug use: No   Sexual activity: Not on file  Other Topics Concern   Not on file  Social History Narrative   Not on file   Social Determinants of Health   Financial Resource Strain: Not on file  Food Insecurity: Not on file  Transportation Needs: Not on file  Physical Activity: Not on file  Stress: Not on file  Social Connections: Not on file  Intimate Partner Violence: Not on file    Past Surgical History:  Procedure Laterality Date   KNEE SURGERY     LEFT HEART CATH AND CORONARY ANGIOGRAPHY  N/A 02/28/2017   Procedure: LEFT HEART CATH AND CORONARY ANGIOGRAPHY;  Surgeon: Burnell Blanks, MD;  Location: [facility];  Service: Cardiovascular;  Laterality: N/A;    Family History  Problem Relation Age of Onset   Hypertension Father    Hypertension Mother     No Known Allergies  Current Outpatient Medications on File Prior to Visit  Medication Sig Dispense Refill   acetaminophen-codeine (TYLENOL #3) 300-30 MG tablet Take 1 tablet every 8 (eight) hours as needed by mouth for moderate pain.      ALPRAZolam (XANAX) 0.5 MG tablet Take 0.25-0.5 mg by mouth 3 (three) times daily as needed for anxiety.     apixaban (ELIQUIS) 5 MG TABS tablet Take 1 tablet (5 mg total) by mouth 2 (two) times daily. 180 tablet 1   atorvastatin (LIPITOR) 80 MG tablet Take 1 tablet (80 mg total) by mouth daily. 30 tablet 3   cloNIDine (CATAPRES) 0.1 MG tablet Take 1 tablet (0.1 mg total) by mouth daily. 90 tablet 3   diltiazem (CARDIZEM CD) 360 MG 24 hr capsule Take 1 capsule (360 mg total) by mouth daily. 90 capsule 1   esomeprazole (NEXIUM) 20 MG capsule Take 20 mg by mouth daily at 12 noon.     ezetimibe (ZETIA) 10 MG tablet Take 1 tablet (10 mg total) by mouth daily. 90 tablet 2   flecainide (TAMBOCOR) 50 MG tablet Take 1 tablet (50 mg total) by mouth 2 (two) times daily. 60 tablet 5   glipiZIDE (GLUCOTROL) 10 MG tablet Take 10 mg 2 (two) times daily before a meal by mouth.      guaiFENesin (MUCINEX) 600 MG 12 hr tablet Take 600 mg by mouth daily as needed for to loosen phlegm or cough.     hydrOXYzine (ATARAX/VISTARIL) 10 MG tablet Take 10 mg by mouth in the morning and at bedtime.     LANTUS SOLOSTAR 100 UNIT/ML Solostar Pen Inject 50 Units into the skin at bedtime.     losartan (COZAAR) 50 MG tablet TAKE 1 TABLET(50 MG) BY MOUTH DAILY (Patient taking differently: Take 100 mg by mouth daily.) 90 tablet 3   metFORMIN (GLUCOPHAGE) 1000 MG tablet Take 1,000 mg by mouth 2 (two) times daily.      metoprolol (TOPROL-XL) 200 MG 24 hr tablet Take 1 tablet (200 mg total) by mouth daily. 90 tablet 1   OVER THE COUNTER MEDICATION Take 1 tablet by mouth daily. Beta Prostate     potassium chloride (KLOR-CON) 10 MEQ tablet Take 1 tablet (10 mEq total) by mouth daily. 90 tablet 0   sulfamethoxazole-trimethoprim (BACTRIM DS) 800-160 MG tablet Take 1 tablet by mouth 2 (two) times daily. 20 tablet 0   tamsulosin (FLOMAX) 0.4 MG CAPS capsule Take 0.4 mg by mouth daily.     torsemide (DEMADEX) 20 MG tablet Take 1 tablet (20 mg total) by mouth  daily. 90 tablet 0   nitroGLYCERIN (NITROSTAT) 0.4 MG SL tablet Place 1 tablet (0.4 mg total) under the tongue every 5 (five) minutes as needed for chest pain. 25 tablet 2   No current facility-administered medications on file prior to visit.    BP 136/70   Pulse 100   Resp 18   Ht 5\' 7"  (1.702 m)   Wt 250 lb (113.4 kg)   SpO2 97%   BMI 39.16 kg/m        Objective:   Physical Exam  General- No acute distress. Pleasant patient. Neck- Full range of motion, no jvd Lungs- Clear, even and unlabored. Heart- regular rate and rhythm. Neurologic- CNII- XII grossly intact.  Loer ext- no pedal edema.       Assessment & Plan:   Patient Instructions  Htn and PAF continue diltiazem cd 360 mg daily, losartan 50 mg daily, flecainide 50 mg twice daily and metoprolol 200 mg xl 1 tab po q day.  Dr. added clonidine 0.1 mg daily just recently. Blood pressure much improved since starting back on metoprolol.   For high cholesterol continue atorvastatin 80 mg po q day and zetia 10 mg q day.   Diabetes- a1c was 7.0 recently. On metformin 1000 mg twice daily and on glipizide 10 mg daily. Continue lantus as well.  For frequent urination, bph history and borderline high psa value placed referral to urologist. Continue flomax presently.  Follow up in 3 months or sooner if needed.       Dewayne Shorter, PA-C

## 2022-01-23 NOTE — Patient Instructions (Addendum)
Htn and PAF continue diltiazem cd 360 mg daily, losartan 50 mg daily, flecainide 50 mg twice daily and metoprolol 200 mg xl 1 tab po q day.  Dr. Kirkland Hun added clonidine 0.1 mg daily just recently. Blood pressure much improved since starting back on metoprolol.   For high cholesterol continue atorvastatin 80 mg po q day and zetia 10 mg q day.   Diabetes- a1c was 7.0 recently. On metformin 1000 mg twice daily and on glipizide 10 mg daily. Continue lantus as well.  For frequent urination, bph history and borderline high psa value placed referral to urologist. Continue flomax presently.  Follow up in 3 months or sooner if needed.

## 2022-01-31 ENCOUNTER — Other Ambulatory Visit: Payer: Self-pay | Admitting: Cardiology

## 2022-02-05 ENCOUNTER — Ambulatory Visit: Payer: Medicare Other | Admitting: Medical

## 2022-02-09 ENCOUNTER — Telehealth: Payer: Self-pay

## 2022-02-09 NOTE — Telephone Encounter (Signed)
-----   Message from Park Liter, MD sent at 01/26/2022  8:24 AM EDT ----- Monitor shows supraventricular as well as ventricular ectopy but relatively low burden no need to do anything about it.

## 2022-02-09 NOTE — Telephone Encounter (Signed)
Patient notified of results.

## 2022-02-19 ENCOUNTER — Other Ambulatory Visit: Payer: Self-pay | Admitting: Cardiology

## 2022-02-19 NOTE — Telephone Encounter (Signed)
Rx refill sent to pharmacy. 

## 2022-02-22 ENCOUNTER — Other Ambulatory Visit: Payer: Self-pay | Admitting: Cardiology

## 2022-03-28 ENCOUNTER — Other Ambulatory Visit: Payer: Self-pay | Admitting: Medical

## 2022-04-05 ENCOUNTER — Other Ambulatory Visit: Payer: Self-pay | Admitting: Medical

## 2022-04-05 ENCOUNTER — Other Ambulatory Visit: Payer: Self-pay

## 2022-04-05 NOTE — Patient Outreach (Signed)
Aging Gracefully Program  04/05/2022  Sufyaan Palma [birth year] [phone]   [facility] Evaluation Interviewer attempted to call patient on today regarding Aging Gracefully referral. No answer from patient after multiple rings. CMA left confidential voicemail for patient to return call.  Will attempt to call back within 1 week.   Vanice Sarah Care Management Assistant [phone]

## 2022-04-08 ENCOUNTER — Other Ambulatory Visit: Payer: Self-pay | Admitting: Cardiology

## 2022-04-18 ENCOUNTER — Telehealth: Payer: Self-pay | Admitting: Medical

## 2022-04-18 NOTE — Telephone Encounter (Signed)
Left message for patient to call back and schedule Medicare Annual Wellness Visit (AWV) to be done virtually or by telephone.  No hx of AWV eligible as of 11/21/16  Please schedule at anytime with [facility] SW Health Coach.        Any questions, please call me at [phone]

## 2022-04-24 ENCOUNTER — Encounter: Payer: Self-pay | Admitting: Cardiology

## 2022-04-24 ENCOUNTER — Ambulatory Visit: Payer: Medicare Other | Attending: Cardiology | Admitting: Cardiology

## 2022-04-24 VITALS — BP 128/70 | HR 100 | Ht 67.0 in | Wt 249.0 lb

## 2022-04-24 DIAGNOSIS — Z79899 Other long term (current) drug therapy: Secondary | ICD-10-CM

## 2022-04-24 DIAGNOSIS — I480 Paroxysmal atrial fibrillation: Secondary | ICD-10-CM | POA: Diagnosis not present

## 2022-04-24 DIAGNOSIS — I10 Essential (primary) hypertension: Secondary | ICD-10-CM | POA: Diagnosis not present

## 2022-04-24 DIAGNOSIS — I2510 Atherosclerotic heart disease of native coronary artery without angina pectoris: Secondary | ICD-10-CM

## 2022-04-24 DIAGNOSIS — E118 Type 2 diabetes mellitus with unspecified complications: Secondary | ICD-10-CM

## 2022-04-24 DIAGNOSIS — E785 Hyperlipidemia, unspecified: Secondary | ICD-10-CM

## 2022-04-24 NOTE — Patient Instructions (Signed)
Medication Instructions:  Your physician recommends that you continue on your current medications as directed. Please refer to the Current Medication list given to you today.  *If you need a refill on your cardiac medications before your next appointment, please call your pharmacy*   Lab Work: None If you have labs (blood work) drawn today and your tests are completely normal, you will receive your results only by: MyChart Message (if you have MyChart) OR A paper copy in the mail If you have any lab test that is abnormal or we need to change your treatment, we will call you to review the results.   Testing/Procedures: None   Follow-Up: At [facility], you and your health needs are our priority.  As part of our continuing mission to provide you with exceptional heart care, we have created designated Provider Care Teams.  These Care Teams include your primary Cardiologist (physician) and Advanced Practice Providers (APPs -  Physician Assistants and Nurse Practitioners) who all work together to provide you with the care you need, when you need it.  We recommend signing up for the patient portal called "MyChart".  Sign up information is provided on this After Visit Summary.  MyChart is used to connect with patients for Virtual Visits (Telemedicine).  Patients are able to view lab/test results, encounter notes, upcoming appointments, etc.  Non-urgent messages can be sent to your provider as well.   To learn more about what you can do with MyChart, go to [email or web address].    Your next appointment:   6 month(s)  The format for your next appointment:   In Person  Provider:   Robert Krasowski, MD    Other Instructions   Important Information About Sugar       

## 2022-04-24 NOTE — Progress Notes (Signed)
Cardiology Office Note:    Date:  04/24/2022   ID:  Tyler Ellis, DOB May 12, 1950, MRN [id number]  PCP:  Mackie Pai, PA-C  Cardiologist:  Jenne Campus, MD    Referring MD: Mackie Pai, PA-C     History of Present Illness:    Tyler Ellis is a 72 y.o. male with past medical history significant for paroxysmal atrial fibrillation, he is anticoagulated he is also on flecainide 50 mg twice daily.  Overall doing well denies having any chest pain tightness squeezing pressure burning chest no palpitations.  Additional problem include minimal coronary artery disease cardiac catheterization from 2019 showed 10% of proximal and 10% of mid RCA, essential hypertension, diabetes, dyslipidemia. He comes today 2 months for follow-up he said overall doing well.  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   Enlarged prostate with urinary obstruction 05/11/2020   Essential hypertension 03/11/2017   Hypertension    Hyperthyroidism 06/26/2017   Medication management 06/06/2020   Multinodular goiter 07/03/2017   Nocturia 02/02/2014   NSTEMI (non-ST elevated myocardial infarction) ([place]) 02/27/2017   Palpitations 10/28/2019   Paroxysmal atrial fibrillation ([place]) 05/24/2020   Supraventricular tachycardia 02/14/2018   Type 2 diabetes mellitus with complication, without long-term current use of insulin ([place]) 03/11/2017    Past Surgical History:  Procedure Laterality Date   KNEE SURGERY     LEFT HEART CATH AND CORONARY ANGIOGRAPHY N/A 02/28/2017   Procedure: LEFT HEART CATH AND CORONARY ANGIOGRAPHY;  Surgeon: Burnell Blanks, MD;  Location: [facility];  Service: Cardiovascular;  Laterality: N/A;     Current Medications: Current Meds  Medication Sig   acetaminophen-codeine (TYLENOL #3) 300-30 MG tablet Take 1 tablet every 8 (eight) hours as needed by mouth for moderate pain.    ALPRAZolam (XANAX) 0.5 MG tablet Take 0.25-0.5 mg by mouth 3 (three) times daily as needed for anxiety.   apixaban (ELIQUIS) 5 MG TABS tablet Take 1 tablet (5 mg total) by mouth 2 (two) times daily.   atorvastatin (LIPITOR) 80 MG tablet Take 1 tablet (80 mg total) by mouth daily.   cloNIDine (CATAPRES) 0.1 MG tablet Take 1 tablet (0.1 mg total) by mouth daily.   diltiazem (CARDIZEM CD) 360 MG 24 hr capsule TAKE 1 CAPSULE(360 MG) BY MOUTH DAILY (Patient taking differently: Take 360 mg by mouth daily.)   esomeprazole (NEXIUM) 20 MG capsule Take 20 mg by mouth daily at 12 noon.   ezetimibe (ZETIA) 10 MG tablet Take 1 tablet (10 mg total) by mouth daily.   flecainide (TAMBOCOR) 50 MG tablet TAKE 1 TABLET(50 MG) BY MOUTH TWICE DAILY (Patient taking differently: Take 50 mg by mouth 2 (two) times daily.)   glipiZIDE (GLUCOTROL) 10 MG tablet Take 10 mg 2 (two) times daily before a meal by mouth.    guaiFENesin (MUCINEX) 600 MG 12 hr tablet Take 600 mg by mouth daily as needed for to loosen phlegm or cough.   hydrOXYzine (ATARAX/VISTARIL) 10 MG tablet Take 10 mg by mouth in the morning and at bedtime.   LANTUS SOLOSTAR 100 UNIT/ML Solostar Pen Inject 50 Units into the skin at bedtime.   losartan (COZAAR) 50 MG tablet TAKE 1 TABLET(50 MG) BY MOUTH DAILY (Patient  taking differently: Take 100 mg by mouth daily.)   metFORMIN (GLUCOPHAGE) 1000 MG tablet Take 1,000 mg by mouth 2 (two) times daily.   metoprolol (TOPROL-XL) 200 MG 24 hr tablet Take 1 tablet (200 mg total) by mouth daily.   nitroGLYCERIN (NITROSTAT) 0.4 MG SL tablet Place 1 tablet (0.4 mg total) under the tongue every 5 (five) minutes as needed for chest pain.   OVER THE COUNTER MEDICATION Take 1 tablet by mouth daily. Beta Prostate   potassium chloride  (KLOR-CON) 10 MEQ tablet Take 1 tablet (10 mEq total) by mouth daily.   sulfamethoxazole-trimethoprim (BACTRIM DS) 800-160 MG tablet Take 1 tablet by mouth 2 (two) times daily.   tamsulosin (FLOMAX) 0.4 MG CAPS capsule Take 0.4 mg by mouth daily.   torsemide (DEMADEX) 20 MG tablet Take 1 tablet (20 mg total) by mouth daily.     Allergies:   Patient has no known allergies.   Social History   Socioeconomic History   Marital status: Divorced    Spouse name: Not on file   Number of children: Not on file   Years of education: Not on file   Highest education level: Not on file  Occupational History   Not on file  Tobacco Use   Smoking status: Never   Smokeless tobacco: Never  Vaping Use   Vaping Use: Never used  Substance and Sexual Activity   Alcohol use: Yes    Comment: weekly   Drug use: No   Sexual activity: Not on file  Other Topics Concern   Not on file  Social History Narrative   Not on file   Social Determinants of Health   Financial Resource Strain: Not on file  Food Insecurity: Not on file  Transportation Needs: Not on file  Physical Activity: Not on file  Stress: Not on file  Social Connections: Not on file     Family History: The patient's family history includes Hypertension in his father and mother. ROS:   Please see the history of present illness.    All 14 point review of systems negative except as described per history of present illness  EKGs/Labs/Other Studies Reviewed:      Recent Labs: 11/27/2021: TSH 0.78 12/26/2021: ALT 35; BUN 12; Creatinine, Ser 1.07; Potassium 4.2; Pro B Natriuretic peptide (BNP) 29.0; Sodium 140 01/02/2022: Hemoglobin 15.2; Platelets 177.0  Recent Lipid Panel    Component Value Date/Time   CHOL 114 11/27/2021 0827   CHOL 118 03/23/2021 1515   TRIG 112.0 11/27/2021 0827   HDL 38.70 (L) 11/27/2021 0827   HDL 43 03/23/2021 1515   CHOLHDL 3 11/27/2021 0827   VLDL 22.4 11/27/2021 0827   LDLCALC 53 11/27/2021 0827    LDLCALC 63 03/23/2021 1515    Physical Exam:    VS:  BP 128/70 (BP Location: Left Arm, Patient Position: Sitting)   Pulse 100   Ht 5\' 7"  (1.702 m)   Wt 249 lb (112.9 kg)   SpO2 96%   BMI 39.00 kg/m     Wt Readings from Last 3 Encounters:  04/24/22 249 lb (112.9 kg)  01/23/22 250 lb (113.4 kg)  01/02/22 251 lb (113.9 kg)     GEN:  Well nourished, well developed in no acute distress HEENT: Normal NECK: No JVD; No carotid bruits LYMPHATICS: No lymphadenopathy CARDIAC: RRR, no murmurs, no rubs, no gallops RESPIRATORY:  Clear to auscultation without rales, wheezing or rhonchi  ABDOMEN: Soft, non-tender, non-distended MUSCULOSKELETAL:  No edema; No deformity  SKIN:  Warm and dry LOWER EXTREMITIES: no swelling NEUROLOGIC:  Alert and oriented x 3 PSYCHIATRIC:  Normal affect   ASSESSMENT:    1. Medication management   2. Paroxysmal atrial fibrillation (HCC)   3. Coronary artery disease involving native coronary artery of native heart without angina pectoris   4. Essential hypertension   5. Type 2 diabetes mellitus with complication, without long-term current use of insulin (HCC)   6. Dyslipidemia    PLAN:    In order of problems listed above:  Paroxysmal atrial fibrillation maintained sinus rhythm with flecainide and anticoagulated which I will continue. Coronary artery disease: Stable from that point review denies have any chest pain tightness squeezing pressure burning chest. Essential hypertension: Blood pressure well-controlled continue present management. Dyslipidemia I did review his K PN which show me LDL 53 HDL 38.  Will continue present management which include high intense statin for of Lipitor as well as Zetia   Medication Adjustments/Labs and Tests Ordered: Current medicines are reviewed at length with the patient today.  Concerns regarding medicines are outlined above.  Orders Placed This Encounter  Procedures   EKG 12-Lead   Medication changes: No orders  of the defined types were placed in this encounter.   Signed, Georgeanna Lea, MD, [facility] 04/24/2022 10:14 AM    [facility]

## 2022-04-25 ENCOUNTER — Ambulatory Visit (INDEPENDENT_AMBULATORY_CARE_PROVIDER_SITE_OTHER): Payer: Medicare Other | Admitting: Medical

## 2022-04-25 VITALS — BP 120/60 | HR 82 | Temp 98.0°F | Resp 18 | Ht 67.0 in | Wt 250.0 lb

## 2022-04-25 DIAGNOSIS — Z23 Encounter for immunization: Secondary | ICD-10-CM

## 2022-04-25 DIAGNOSIS — I2510 Atherosclerotic heart disease of native coronary artery without angina pectoris: Secondary | ICD-10-CM | POA: Diagnosis not present

## 2022-04-25 DIAGNOSIS — E118 Type 2 diabetes mellitus with unspecified complications: Secondary | ICD-10-CM

## 2022-04-25 DIAGNOSIS — I480 Paroxysmal atrial fibrillation: Secondary | ICD-10-CM

## 2022-04-25 DIAGNOSIS — I10 Essential (primary) hypertension: Secondary | ICD-10-CM | POA: Diagnosis not present

## 2022-04-25 LAB — COMPREHENSIVE METABOLIC PANEL
ALT: 16 U/L (ref 0–53)
AST: 15 U/L (ref 0–37)
Albumin: 4.3 g/dL (ref 3.5–5.2)
Alkaline Phosphatase: 77 U/L (ref 39–117)
BUN: 16 mg/dL (ref 6–23)
CO2: 24 mEq/L (ref 19–32)
Calcium: 9.4 mg/dL (ref 8.4–10.5)
Chloride: 102 mEq/L (ref 96–112)
Creatinine, Ser: 1.26 mg/dL (ref 0.40–1.50)
GFR: 57.43 mL/min — ABNORMAL LOW (ref >60.00)
Glucose, Bld: 183 mg/dL — ABNORMAL HIGH (ref 70–99)
Potassium: 4.5 mEq/L (ref 3.5–5.1)
Sodium: 137 mEq/L (ref 135–145)
Total Bilirubin: 0.5 mg/dL (ref 0.2–1.2)
Total Protein: 6.4 g/dL (ref 6.0–8.3)

## 2022-04-25 LAB — HEMOGLOBIN A1C: Hgb A1c MFr Bld: 7.1 % — ABNORMAL HIGH (ref 4.6–6.5)

## 2022-04-25 NOTE — Progress Notes (Signed)
Subjective:    Patient ID: Tyler Ellis, male    DOB: [birth year], 72 y.o.   MRN: [id number]  HPI Pt in for follow up.   Pt has Htn and PAF. On  diltiazem cd 360 mg daily, losartan 50 mg daily, flecainide 50 mg twice daily and metoprolol 200 mg xl 1 tab po q day.  Dr. Kirkland Hun added clonidine 0.1 mg daily.   For high cholesterol. On  atorvastatin 80 mg po q day and zetia 10 mg q day.    Diabetes- On metformin 1000 mg twice daily and on glipizide 10 mg daily. Alos on lantus as well.    On review  bph history and borderline high psa value. Last visit  placed referral to urologist. Advised to continue. Pt states he went to urologist. I reviewed 02-27-2022.      Review of Systems  Constitutional:  Negative for chills, fatigue and fever.  HENT:  Negative for congestion and drooling.   Respiratory:  Negative for cough, chest tightness, shortness of breath and wheezing.   Cardiovascular:  Negative for chest pain and palpitations.  Gastrointestinal:  Negative for abdominal distention, abdominal pain, blood in stool, constipation, diarrhea and nausea.  Genitourinary:  Negative for dysuria, frequency, genital sores and hematuria.  Musculoskeletal:  Negative for back pain, joint swelling and neck pain.  Skin:  Negative for rash.  Neurological:  Negative for dizziness, light-headedness and headaches.  Hematological:  Negative for adenopathy. Does not bruise/bleed easily.  Psychiatric/Behavioral:  Negative for behavioral problems and dysphoric mood.     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   Enlarged prostate with urinary obstruction 05/11/2020   Essential hypertension 03/11/2017   Hypertension    Hyperthyroidism 06/26/2017   Medication management 06/06/2020    Multinodular goiter 07/03/2017   Nocturia 02/02/2014   NSTEMI (non-ST elevated myocardial infarction) ([place]) 02/27/2017   Palpitations 10/28/2019   Paroxysmal atrial fibrillation ([place]) 05/24/2020   Supraventricular tachycardia 02/14/2018   Type 2 diabetes mellitus with complication, without long-term current use of insulin ([place]) 03/11/2017     Social History   Socioeconomic History   Marital status: Divorced    Spouse name: Not on file   Number of children: Not on file   Years of education: Not on file   Highest education level: Not on file  Occupational History   Not on file  Tobacco Use   Smoking status: Never   Smokeless tobacco: Never  Vaping Use   Vaping Use: Never used  Substance and Sexual Activity   Alcohol use: Yes    Comment: weekly   Drug use: No   Sexual activity: Not on file  Other Topics Concern   Not on file  Social History Narrative   Not on file   Social Determinants of Health   Financial Resource Strain: Not on file  Food Insecurity: Not on file  Transportation Needs: Not on file  Physical Activity: Not on file  Stress: Not on file  Social Connections: Not on file  Intimate Partner Violence: Not on file    Past Surgical History:  Procedure Laterality Date   KNEE SURGERY     LEFT HEART CATH AND CORONARY ANGIOGRAPHY N/A 02/28/2017   Procedure: LEFT HEART CATH AND CORONARY ANGIOGRAPHY;  Surgeon: Angelena Form,  Nile Dear, MD;  Location: [facility];  Service: Cardiovascular;  Laterality: N/A;    Family History  Problem Relation Age of Onset   Hypertension Father    Hypertension Mother     No Known Allergies  Current Outpatient Medications on File Prior to Visit  Medication Sig Dispense Refill   acetaminophen-codeine (TYLENOL #3) 300-30 MG tablet Take 1 tablet every 8 (eight) hours as needed by mouth for moderate pain.      ALPRAZolam (XANAX) 0.5 MG tablet Take 0.25-0.5 mg by mouth 3 (three) times daily as needed for anxiety.     apixaban  (ELIQUIS) 5 MG TABS tablet Take 1 tablet (5 mg total) by mouth 2 (two) times daily. 180 tablet 1   atorvastatin (LIPITOR) 80 MG tablet Take 1 tablet (80 mg total) by mouth daily. 30 tablet 3   cloNIDine (CATAPRES) 0.1 MG tablet Take 1 tablet (0.1 mg total) by mouth daily. 90 tablet 3   diltiazem (CARDIZEM CD) 360 MG 24 hr capsule TAKE 1 CAPSULE(360 MG) BY MOUTH DAILY (Patient taking differently: Take 360 mg by mouth daily.) 90 capsule 3   esomeprazole (NEXIUM) 20 MG capsule Take 20 mg by mouth daily at 12 noon.     ezetimibe (ZETIA) 10 MG tablet Take 1 tablet (10 mg total) by mouth daily. 90 tablet 2   flecainide (TAMBOCOR) 50 MG tablet TAKE 1 TABLET(50 MG) BY MOUTH TWICE DAILY (Patient taking differently: Take 50 mg by mouth 2 (two) times daily.) 60 tablet 5   glipiZIDE (GLUCOTROL) 10 MG tablet Take 10 mg 2 (two) times daily before a meal by mouth.      guaiFENesin (MUCINEX) 600 MG 12 hr tablet Take 600 mg by mouth daily as needed for to loosen phlegm or cough.     hydrOXYzine (ATARAX/VISTARIL) 10 MG tablet Take 10 mg by mouth in the morning and at bedtime.     LANTUS SOLOSTAR 100 UNIT/ML Solostar Pen Inject 50 Units into the skin at bedtime.     losartan (COZAAR) 50 MG tablet TAKE 1 TABLET(50 MG) BY MOUTH DAILY (Patient taking differently: Take 100 mg by mouth daily.) 90 tablet 3   metFORMIN (GLUCOPHAGE) 1000 MG tablet Take 1,000 mg by mouth 2 (two) times daily.     metoprolol (TOPROL-XL) 200 MG 24 hr tablet Take 1 tablet (200 mg total) by mouth daily. 90 tablet 1   OVER THE COUNTER MEDICATION Take 1 tablet by mouth daily. Beta Prostate     potassium chloride (KLOR-CON) 10 MEQ tablet Take 1 tablet (10 mEq total) by mouth daily. 90 tablet 2   sulfamethoxazole-trimethoprim (BACTRIM DS) 800-160 MG tablet Take 1 tablet by mouth 2 (two) times daily. 20 tablet 0   tamsulosin (FLOMAX) 0.4 MG CAPS capsule Take 0.4 mg by mouth daily.     torsemide (DEMADEX) 20 MG tablet Take 1 tablet (20 mg total) by  mouth daily. 90 tablet 2   nitroGLYCERIN (NITROSTAT) 0.4 MG SL tablet Place 1 tablet (0.4 mg total) under the tongue every 5 (five) minutes as needed for chest pain. 25 tablet 2   No current facility-administered medications on file prior to visit.    BP 120/60   Pulse 82   Temp 98 F (36.7 C)   Resp 18   Ht 5\' 7"  (1.702 m)   Wt 250 lb (113.4 kg)   SpO2 97%   BMI 39.16 kg/m        Objective:   Physical Exam  General  Mental Status- Alert. General Appearance- Not in acute distress.   Skin General: Color- Normal Color. Moisture- Normal Moisture.  Neck Carotid Arteries- Normal color. Moisture- Normal Moisture. No carotid bruits. No JVD.  Chest and Lung Exam Auscultation: Breath Sounds:-Normal.  Cardiovascular Auscultation:Rythm- Regular. Murmurs & Other Heart Sounds:Auscultation of the heart reveals- No Murmurs.  Abdomen Inspection:-Inspeection Normal. Palpation/Percussion:Note:No mass. Palpation and Percussion of the abdomen reveal- Non Tender, Non Distended + BS, no rebound or guarding.   Neurologic Cranial Nerve exam:- CN III-XII intact(No nystagmus), symmetric smile. Strength:- 5/5 equal and symmetric strength both upper and lower extremities.   Lower ext- symmetric legs. No pedal edema.    Assessment & Plan:   Patient Instructions  For htn and PAF- continue current medication regimen. Reviewed Dr. Kirkland Hun note from yesterday visit.  For high cholesterol continue atorvastatin 80 mg po q day and zetia 10 mg q day. Reminder on next visit to come in fasting.   Diabetes-  check A1c and cmp today.  On metformin 1000 mg twice daily and on glipizide 10 mg daily. Continue lantus as well.   Bph history and borderline high psa value in past. Continue flomax presently. Reviewed urologist not today.  Follow up in 3 months or sooner if needed.   Mackie Pai, PA-C

## 2022-04-25 NOTE — Patient Instructions (Addendum)
For htn and PAF- continue current medication regimen. Reviewed Dr. Kirkland Hun note from yesterday visit.  For high cholesterol continue atorvastatin 80 mg po q day and zetia 10 mg q day. Reminder on next visit to come in fasting.   Diabetes-  check A1c and cmp today.  On metformin 1000 mg twice daily and on glipizide 10 mg daily. Continue lantus as well.   Bph history and borderline high psa value in past. Continue flomax presently. Reviewed urologist not today.  Follow up in 3 months or sooner if needed.

## 2022-04-25 NOTE — Addendum Note (Signed)
Addended by: Jeronimo Greaves on: 04/25/2022 04:06 PM   Modules accepted: Orders

## 2022-04-26 ENCOUNTER — Other Ambulatory Visit: Payer: Self-pay

## 2022-04-26 NOTE — Patient Outreach (Signed)
Aging Gracefully Program  04/26/2022  Tyler Ellis [birth year] [id number]   [facility] Evaluation Interviewer made contact with patient. Aging Gracefully 5 month survey completed.    [facility] Management Assistant [phone]

## 2022-04-30 ENCOUNTER — Other Ambulatory Visit: Payer: Self-pay | Admitting: Medical

## 2022-05-01 ENCOUNTER — Ambulatory Visit (INDEPENDENT_AMBULATORY_CARE_PROVIDER_SITE_OTHER): Payer: Medicare Other | Admitting: *Deleted

## 2022-05-01 DIAGNOSIS — Z0000 Encounter for general adult medical examination without abnormal findings: Secondary | ICD-10-CM | POA: Diagnosis not present

## 2022-05-01 NOTE — Progress Notes (Signed)
Subjective:   Tyler Ellis is a 72 y.o. male who presents for Medicare Annual/Subsequent preventive examination.  I connected with  [place] [facility] on 05/01/22 by a audio enabled telemedicine application and verified that I am speaking with the correct person using two identifiers.  Patient Location: Home  Provider Location: Office/Clinic  I discussed the limitations of evaluation and management by telemedicine. The patient expressed understanding and agreed to proceed.   Review of Systems    Defer to PCP Cardiac Risk Factors include: advanced age (>39men, >55 women);male gender;diabetes mellitus;dyslipidemia;hypertension     Objective:    There were no vitals filed for this visit. There is no height or weight on file to calculate BMI.     05/01/2022   11:20 AM 09/25/2021    2:33 PM 12/13/2018   11:12 AM 02/12/2018    7:48 PM 02/27/2017   11:24 AM  Advanced Directives  Does Patient Have a Medical Advance Directive? No No No No No  Does patient want to make changes to medical advance directive? No - Patient declined      Would patient like information on creating a medical advance directive? Yes (MAU/Ambulatory/Procedural Areas - Information given) No - Patient declined       Current Medications (verified) Outpatient Encounter Medications as of 05/01/2022  Medication Sig   acetaminophen-codeine (TYLENOL #3) 300-30 MG tablet Take 1 tablet every 8 (eight) hours as needed by mouth for moderate pain.    ALPRAZolam (XANAX) 0.5 MG tablet Take 0.25-0.5 mg by mouth 3 (three) times daily as needed for anxiety.   apixaban (ELIQUIS) 5 MG TABS tablet Take 1 tablet (5 mg total) by mouth 2 (two) times daily.   atorvastatin (LIPITOR) 80 MG tablet TAKE 1 TABLET(80 MG) BY MOUTH DAILY   cloNIDine (CATAPRES) 0.1 MG tablet Take 1 tablet (0.1 mg total) by mouth daily.   diltiazem (CARDIZEM CD) 360 MG 24 hr capsule TAKE 1 CAPSULE(360 MG) BY MOUTH DAILY (Patient taking differently: Take 360 mg by mouth  daily.)   esomeprazole (NEXIUM) 20 MG capsule Take 20 mg by mouth daily at 12 noon.   ezetimibe (ZETIA) 10 MG tablet Take 1 tablet (10 mg total) by mouth daily.   flecainide (TAMBOCOR) 50 MG tablet TAKE 1 TABLET(50 MG) BY MOUTH TWICE DAILY (Patient taking differently: Take 50 mg by mouth 2 (two) times daily.)   glipiZIDE (GLUCOTROL) 10 MG tablet Take 10 mg 2 (two) times daily before a meal by mouth.    guaiFENesin (MUCINEX) 600 MG 12 hr tablet Take 600 mg by mouth daily as needed for to loosen phlegm or cough.   hydrOXYzine (ATARAX/VISTARIL) 10 MG tablet Take 10 mg by mouth in the morning and at bedtime.   LANTUS SOLOSTAR 100 UNIT/ML Solostar Pen Inject 50 Units into the skin at bedtime.   losartan (COZAAR) 50 MG tablet TAKE 1 TABLET(50 MG) BY MOUTH DAILY (Patient taking differently: Take 100 mg by mouth daily.)   metFORMIN (GLUCOPHAGE) 1000 MG tablet Take 1,000 mg by mouth 2 (two) times daily.   metoprolol (TOPROL-XL) 200 MG 24 hr tablet Take 1 tablet (200 mg total) by mouth daily.   nitroGLYCERIN (NITROSTAT) 0.4 MG SL tablet Place 1 tablet (0.4 mg total) under the tongue every 5 (five) minutes as needed for chest pain.   OVER THE COUNTER MEDICATION Take 1 tablet by mouth daily. Beta Prostate   potassium chloride (KLOR-CON) 10 MEQ tablet Take 1 tablet (10 mEq total) by mouth daily.   tamsulosin (  FLOMAX) 0.4 MG CAPS capsule Take 0.4 mg by mouth daily.   torsemide (DEMADEX) 20 MG tablet Take 1 tablet (20 mg total) by mouth daily.   [DISCONTINUED] sulfamethoxazole-trimethoprim (BACTRIM DS) 800-160 MG tablet Take 1 tablet by mouth 2 (two) times daily.   No facility-administered encounter medications on file as of 05/01/2022.    Allergies (verified) Patient has no known allergies.   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   Enlarged prostate with urinary obstruction 05/11/2020   Essential hypertension 03/11/2017   Hypertension    Hyperthyroidism 06/26/2017   Medication management 06/06/2020   Multinodular goiter 07/03/2017   Nocturia 02/02/2014   NSTEMI (non-ST elevated myocardial infarction) ([facility]) 02/27/2017   Palpitations 10/28/2019   Paroxysmal atrial fibrillation ([facility]) 05/24/2020   Supraventricular tachycardia 02/14/2018   Type 2 diabetes mellitus with complication, without long-term current use of insulin ([facility]) 03/11/2017   Past Surgical History:  Procedure Laterality Date   KNEE SURGERY     LEFT HEART CATH AND CORONARY ANGIOGRAPHY N/A 02/28/2017   Procedure: LEFT HEART CATH AND CORONARY ANGIOGRAPHY;  Surgeon: Kathleene Hazel, MD;  Location: [facility];  Service: Cardiovascular;  Laterality: N/A;   Family History  Problem Relation Age of Onset   Hypertension Father    Hypertension Mother    Social History   Socioeconomic History   Marital status: Divorced    Spouse name: Not on file   Number of children: Not on file   Years of education: Not on file   Highest education level: Not on file  Occupational History   Not on file  Tobacco Use   Smoking status: Never   Smokeless tobacco: Never  Vaping Use   Vaping Use: Never used  Substance and Sexual Activity   Alcohol use: Yes    Comment: weekly   Drug use: No   Sexual activity: Not on file  Other Topics Concern   Not on file  Social History Narrative   Not on file   Social Determinants of Health   Financial Resource Strain: Low Risk  (05/01/2022)   Overall Financial Resource Strain (CARDIA)    Difficulty of Paying Living Expenses: Not hard at all  Food Insecurity: No Food Insecurity (05/01/2022)   Hunger Vital Sign    Worried About Running Out of Food in the Last Year: Never true    Ran Out of Food in the Last Year: Never true  Transportation Needs: No  Transportation Needs (05/01/2022)   PRAPARE - Administrator, Civil Service (Medical): No    Lack of Transportation (Non-Medical): No  Physical Activity: Insufficiently Active (05/01/2022)   Exercise Vital Sign    Days of Exercise per Week: 2 days    Minutes of Exercise per Session: 20 min  Stress: No Stress Concern Present (05/01/2022)   [employer] of Occupational Health - Occupational Stress Questionnaire    Feeling of Stress : Not at all  Social Connections: Moderately Isolated (05/01/2022)   Social Connection and Isolation Panel [NHANES]    Frequency of Communication with Friends and Family: More than three times a week    Frequency of Social Gatherings with Friends and Family: Once a week    Attends Religious Services: More than 4 times per  year    Active Member of Clubs or Organizations: No    Attends Archivist Meetings: Never    Marital Status: Divorced    Tobacco Counseling Counseling given: Not Answered   Clinical Intake:  Pre-visit preparation completed: Yes  Pain : No/denies pain  How often do you need to have someone help you when you read instructions, pamphlets, or other written materials from your doctor or pharmacy?: 1 - Never  Diabetic? Nutrition Risk Assessment:  Has the patient had any N/V/D within the last 2 months?  No  Does the patient have any non-healing wounds?  No  Has the patient had any unintentional weight loss or weight gain?  No   Diabetes:  Is the patient diabetic?  Yes  If diabetic, was a CBG obtained today?  No  Did the patient bring in their glucometer from home?  No  How often do you monitor your CBG's? Once daily.   Financial Strains and Diabetes Management:  Are you having any financial strains with the device, your supplies or your medication? No .  Does the patient want to be seen by Chronic Care Management for management of their diabetes?  No  Would the patient like to be referred to a Nutritionist or  for Diabetic Management?  No   Diabetic Exams:  Diabetic Eye Exam: Overdue for diabetic eye exam. Pt has been advised about the importance in completing this exam. Patient advised to call and schedule an eye exam. Diabetic Foot Exam: Overdue, Pt has been advised about the importance in completing this exam. Pt is scheduled for diabetic foot exam on N/a.  Interpreter Needed?: No  Information entered by :: Beatris Ship, [place]   Activities of Daily Living    05/01/2022   11:28 AM  In your present state of health, do you have any difficulty performing the following activities:  Hearing? 0  Vision? 1  Comment has floaters  Difficulty concentrating or making decisions? 1  Comment slight memory loss  Walking or climbing stairs? 0  Dressing or bathing? 0  Doing errands, shopping? 0  Preparing Food and eating ? N  Using the Toilet? N  In the past six months, have you accidently leaked urine? N  Do you have problems with loss of bowel control? N  Managing your Medications? N  Managing your Finances? N  Housekeeping or managing your Housekeeping? N    Patient Care Team: Saguier, Iris Pert as PCP - General (Internal Medicine) Park Liter, MD as PCP - Cardiology (Cardiology) Constance Haw, MD as PCP - Electrophysiology (Cardiology)  Indicate any recent Medical Services you may have received from other than [facility] providers in the past year (date may be approximate).     Assessment:   This is a routine wellness examination for [place].  Hearing/Vision screen No results found.  Dietary issues and exercise activities discussed: Current Exercise Habits: Home exercise routine, Type of exercise: calisthenics;strength training/weights;Other - see comments;stretching (stationary bike), Time (Minutes): 15, Frequency (Times/Week): 2, Weekly Exercise (Minutes/Week): 30, Intensity: Moderate, Exercise limited by: None identified   Goals Addressed   None    Depression Screen     05/01/2022   11:26 AM 04/26/2022    3:53 PM 11/08/2021    9:06 AM 09/01/2021    9:37 AM 08/01/2021    2:04 PM  PHQ 2/9 Scores  PHQ - 2 Score 0 0 0 0 0    Fall Risk    05/01/2022   11:22  AM 11/08/2021    9:06 AM 08/20/2021    9:08 AM  Fall Risk   Falls in the past year? 0 0 0  Number falls in past yr: 0 0 0  Injury with Fall? 0 0 0  Risk for fall due to : No Fall Risks  No Fall Risks  Follow up Falls evaluation completed  Falls evaluation completed    [place]:  Any stairs in or around the home? No  If so, are there any without handrails? No  Home free of loose throw rugs in walkways, pet beds, electrical cords, etc? Yes  Adequate lighting in your home to reduce risk of falls? Yes   ASSISTIVE DEVICES UTILIZED TO PREVENT FALLS:  Life alert? No  Use of a cane, walker or w/c?  Cane sometimes Grab bars in the bathroom? Yes  Shower chair or bench in shower? Yes  Elevated toilet seat or a handicapped toilet? Yes   TIMED UP AND GO:  Was the test performed?  No, audio visit .    Cognitive Function:    05/01/2022   11:43 AM  MMSE - Mini Mental State Exam  Not completed: Unable to complete        Immunizations Immunization History  Administered Date(s) Administered   Fluad Quad(high Dose 65+) 01/25/2021, 04/25/2022   Influenza, High Dose Seasonal PF 12/26/2018   Janssen (J&J) SARS-COV-2 Vaccination 07/05/2019   Pfizer Covid-19 Vaccine Bivalent Booster 30yrs & up 01/25/2021    TDAP status: Due, Education has been provided regarding the importance of this vaccine. Advised may receive this vaccine at local pharmacy or Health Dept. Aware to provide a copy of the vaccination record if obtained from local pharmacy or Health Dept. Verbalized acceptance and understanding.  Flu Vaccine status: Up to date  Pneumococcal vaccine status: Due, Education has been provided regarding the importance of this vaccine. Advised may receive this vaccine at local  pharmacy or Health Dept. Aware to provide a copy of the vaccination record if obtained from local pharmacy or Health Dept. Verbalized acceptance and understanding.  Covid-19 vaccine status: Information provided on how to obtain vaccines.   Qualifies for Shingles Vaccine? Yes   Zostavax completed No   Shingrix Completed?: No.    Education has been provided regarding the importance of this vaccine. Patient has been advised to call insurance company to determine out of pocket expense if they have not yet received this vaccine. Advised may also receive vaccine at local pharmacy or Health Dept. Verbalized acceptance and understanding.  Screening Tests Health Maintenance  Topic Date Due   Medicare Annual Wellness (AWV)  Never done   FOOT EXAM  Never done   OPHTHALMOLOGY EXAM  Never done   Diabetic kidney evaluation - Urine ACR  Never done   Hepatitis C Screening  Never done   DTaP/Tdap/Td (1 - Tdap) Never done   Zoster Vaccines- Shingrix (1 of 2) Never done   COLONOSCOPY (Pts 45-62yrs Insurance coverage will need to be confirmed)  Never done   Pneumonia Vaccine 42+ Years old (1 - PCV) Never done   COVID-19 Vaccine (3 - 2023-24 season) 12/22/2021   HEMOGLOBIN A1C  10/24/2022   Diabetic kidney evaluation - eGFR measurement  04/26/2023   INFLUENZA VACCINE  Completed   HPV VACCINES  Aged Out    Health Maintenance  Health Maintenance Due  Topic Date Due   Medicare Annual Wellness (AWV)  Never done   FOOT EXAM  Never  done   OPHTHALMOLOGY EXAM  Never done   Diabetic kidney evaluation - Urine ACR  Never done   Hepatitis C Screening  Never done   DTaP/Tdap/Td (1 - Tdap) Never done   Zoster Vaccines- Shingrix (1 of 2) Never done   COLONOSCOPY (Pts 45-58yrs Insurance coverage will need to be confirmed)  Never done   Pneumonia Vaccine 45+ Years old (1 - PCV) Never done   COVID-19 Vaccine (3 - 2023-24 season) 12/22/2021    Colorectal cancer screening: Type of screening: Colonoscopy. Completed  04/30/18. Repeat every N/a years  Lung Cancer Screening: (Low Dose CT Chest recommended if Age 65-80 years, 30 pack-year currently smoking OR have quit w/in 15years.) does not qualify.   Additional Screening:  Hepatitis C Screening: does qualify; Completed N/a  Vision Screening: Recommended annual ophthalmology exams for early detection of glaucoma and other disorders of the eye. Is the patient up to date with their annual eye exam?  Yes went in Aug 2023 Who is the provider or what is the name of the office in which the patient attends annual eye exams? Doesn't remember If pt is not established with a provider, would they like to be referred to a provider to establish care? No .   Dental Screening: Recommended annual dental exams for proper oral hygiene  Community Resource Referral / Chronic Care Management: CRR required this visit?  No   CCM required this visit?  No      Plan:     I have personally reviewed and noted the following in the patient's chart:   Medical and social history Use of alcohol, tobacco or illicit drugs  Current medications and supplements including opioid prescriptions. Patient is not currently taking opioid prescriptions. Functional ability and status Nutritional status Physical activity Advanced directives List of other physicians Hospitalizations, surgeries, and ER visits in previous 12 months Vitals Screenings to include cognitive, depression, and falls Referrals and appointments  In addition, I have reviewed and discussed with patient certain preventive protocols, quality metrics, and best practice recommendations. A written personalized care plan for preventive services as well as general preventive health recommendations were provided to patient.   Due to this being a telephonic visit, the after visit summary with patients personalized plan was offered to patient via mail or my-chart. Per request, patient was mailed a copy of AVS.   Beatris Ship, [place]   05/01/2022   Nurse Notes: None

## 2022-05-01 NOTE — Patient Instructions (Signed)
Mr. Tyler Ellis , Thank you for taking time to come for your Medicare Wellness Visit. I appreciate your ongoing commitment to your health goals. Please review the following plan we discussed and let me know if I can assist you in the future.   These are the goals we discussed:  Goals      Aging Gracefully RN Goal - Track and Manage My Blood Pressure-Hypertension     Timeframe:  Long-Range Goal Priority:  High Start Date: 09/01/21                            Expected End Date:   12/02/21                  12/11/2021  Assessment: Patient reports that he last checked his BP was a week ago and it was normal. Reports that his BP has been good since he cut back on his salt.   Intervention:  Encouraged patient to self monitor BP 3 times per week.  Encouraged patient to write down his readings. Reviewed pending cardiology appointment.   Plan: goal met.   [name] RN, BSN, CEN RN Case Freight forwarder for [employer] Mobile: [phone]  10/09/21 Visit #2 Assessment: reports last blood pressure check 140/92. He states he does not use salt in cooking, however states uses a different seasing. RN looked at ingredients and first ingrediant was salt.  Interventions: Discussed limiting salt intake. Teaching regarding how to read labels completed ProvidedHigh Salt/Low salt food brochure Provided HTN handout Recommendations to use Ms. Dash, herbs, pepper when seasoning foods Encouraged to continue to monitor and record blood pressure  [name], RN, MSN, BSN, [facility] Management Coordinator [phone]  09/01/21 Initial Visit - choose a place to take my blood pressure (home, clinic or office, retail store) - write blood pressure results in a log or diary    Why is this important?   You won't feel high blood pressure, but it can still hurt your blood vessels.  High blood pressure can cause heart or kidney problems. It can also cause a stroke.  Making  lifestyle changes like losing a little weight or eating less salt will help.  Checking your blood pressure at home and at different times of the day can help to control blood pressure.  If the doctor prescribes medicine remember to take it the way the doctor ordered.  Call the office if you cannot afford the medicine or if there are questions about it.     Notes:  Client states his blood pressure has been elevated and he needs to get it under control.  Education given around diet and exercise.  Client states he doesn't understand how to read labels.  Sodium is sometimes an issue.  He does rinse can vegetables but then puts cured meat in the vegetables to cook.  States he didn't realize he was adding the sodium back to his food.  Sending sodium education sheet.       Patient Stated     Patient will improve safety while bathing. ACTION PLANNING - BATHING Target Problem Area: unsteady while showering    Why Problem May Occur:  Stays in hot water too long Breathing is compromised by steam Pain in back and legs when standing for too long    Target Goal: increased steadiness and safety in shower    STRATEGIES Saving Your Energy: DO: DON'T:  Use a tub  bench/seat Stand while bathing, it uses more energy  Use appropriate adaptive equipment:  long handled sponge, soap on a rope Rush  Keep all items you'll need within easy reach   Other   Other   Modifying your home environment and making it safe: DO: DON'T:  Install grab bars n the shower and next to the toilet   Place a rubber mat along the entire length of the tub Place loose rugs in the bathroom- they can trip you or your walker/cane can get caught on them  Make sure the bathroom is well lit    Install a hand held shower head   Simplifying the way you set up tasks or daily routines: DO: DON'T:  Plan to bathe/shower before you're overly tired Rush through [employer] all items before getting started   Other   Other    Other    PRACTICE It is important to practice the strategies so we can determine if they will be effective in helping to reach your goal. Follow these specific recommendations:  Use shower seat when bathing  2. 3.  If a strategy does not work the first time, try it again and again (and maybe again). We may make some changes over the next few sessions, based on how they work.        Patient Stated     Patient will improve safety while getting in and out of the shower. ACTION PLANNING - FUNCTIONAL MOBILITY Target Problem Area: Decreased safety and independence with toilet and tub transfers.    Why Problem May Occur: Difficult to raise legs over tub wall Toilet seats are extremely low to the ground. No grab bars present to use as support with transfers.    Target Goal: Patient will demonstrate improved independence with toilet and tub transfers.    STRATEGIES Saving Your Energy: DO: DON'T:  Take breaks    Raise the height of surfaces    Take frequent rests. Just taking 15 minutes in a comfortable chair before becoming fatigued may help to restore your energy   Remove tripping hazards     Other   Modifying your home environment and making it safe: DO: DON'T:  Install grab bars in the bathroom Hold onto unsafe surfaces (towel racks, shower curtains, soap dishes, etc)  Remove or strongly secure throw rugs    Provide adequate lighting Use dim lights or lights that cast a lot of shadows  Other conversion of tub to walk in shower    Other replace low commodes with comfort level commodes.    Simplifying the way you set up tasks or daily routines: DO: DON'T:  Move slowly Rush during transfers or walking   Other   Other    Practice It is important to practice the strategies so we can determine if they will be effective in helping to reach your goal. Follow these specific recommendations:  Use grab bars. 2.    Don't rush when transferring.  3.  If a strategy does not  work the first time, try it again and again (and maybe again). We may make some changes over the next few sessions, based on how they work.          Patient Stated     Patient will improve safety getting on and off of the commode.      Patient Stated     Patient will improve balance while completing grooming tasks at the bathroom sink.  Patient states he  becomes fatigued when standing at sink and prefers to lean on vanity.  His vanity was very low and this was causing pain in his back.  We elevated to a comfort level vanity and he now has much less difficulty with grooming and much less pain.         This is a list of the screening recommended for you and due dates:  Health Maintenance  Topic Date Due   Complete foot exam   Never done   Eye exam for diabetics  Never done   Yearly kidney health urinalysis for diabetes  Never done   Hepatitis C Screening: USPSTF Recommendation to screen - Ages 53-79 yo.  Never done   DTaP/Tdap/Td vaccine (1 - Tdap) Never done   Zoster (Shingles) Vaccine (1 of 2) Never done   Colon Cancer Screening  Never done   Pneumonia Vaccine (1 - PCV) Never done   COVID-19 Vaccine (3 - 2023-24 season) 12/22/2021   Hemoglobin A1C  10/24/2022   Yearly kidney function blood test for diabetes  04/26/2023   Medicare Annual Wellness Visit  05/02/2023   Flu Shot  Completed   HPV Vaccine  Aged Out     Next appointment: Follow up in one year for your annual wellness visit.   Preventive Care 69 Years and Older, Male Preventive care refers to lifestyle choices and visits with your health care provider that can promote health and wellness. What does preventive care include? A yearly physical exam. This is also called an annual well check. Dental exams once or twice a year. Routine eye exams. Ask your health care provider how often you should have your eyes checked. Personal lifestyle choices, including: Daily care of your teeth and gums. Regular physical  activity. Eating a healthy diet. Avoiding tobacco and drug use. Limiting alcohol use. Practicing safe sex. Taking low doses of aspirin every day. Taking vitamin and mineral supplements as recommended by your health care provider. What happens during an annual well check? The services and screenings done by your health care provider during your annual well check will depend on your age, overall health, lifestyle risk factors, and family history of disease. Counseling  Your health care provider may ask you questions about your: Alcohol use. Tobacco use. Drug use. Emotional well-being. Home and relationship well-being. Sexual activity. Eating habits. History of falls. Memory and ability to understand (cognition). Work and work Statistician. Screening  You may have the following tests or measurements: Height, weight, and BMI. Blood pressure. Lipid and cholesterol levels. These may be checked every 5 years, or more frequently if you are over 41 years old. Skin check. Lung cancer screening. You may have this screening every year starting at age 56 if you have a 30-pack-year history of smoking and currently smoke or have quit within the past 15 years. Fecal occult blood test (FOBT) of the stool. You may have this test every year starting at age 20. Flexible sigmoidoscopy or colonoscopy. You may have a sigmoidoscopy every 5 years or a colonoscopy every 10 years starting at age 40. Prostate cancer screening. Recommendations will vary depending on your family history and other risks. Hepatitis C blood test. Hepatitis B blood test. Sexually transmitted disease (STD) testing. Diabetes screening. This is done by checking your blood sugar (glucose) after you have not eaten for a while (fasting). You may have this done every 1-3 years. Abdominal aortic aneurysm (AAA) screening. You may need this if you are a current or former smoker. Osteoporosis.  You may be screened starting at age 38 if you are  at high risk. Talk with your health care provider about your test results, treatment options, and if necessary, the need for more tests. Vaccines  Your health care provider may recommend certain vaccines, such as: Influenza vaccine. This is recommended every year. Tetanus, diphtheria, and acellular pertussis (Tdap, Td) vaccine. You may need a Td booster every 10 years. Zoster vaccine. You may need this after age 42. Pneumococcal 13-valent conjugate (PCV13) vaccine. One dose is recommended after age 72. Pneumococcal polysaccharide (PPSV23) vaccine. One dose is recommended after age 40. Talk to your health care provider about which screenings and vaccines you need and how often you need them. This information is not intended to replace advice given to you by your health care provider. Make sure you discuss any questions you have with your health care provider. Document Released: 05/06/2015 Document Revised: 12/28/2015 Document Reviewed: 02/08/2015 Elsevier Interactive Patient Education  2017 [place] Prevention in the Home Falls can cause injuries. They can happen to people of all ages. There are many things you can do to make your home safe and to help prevent falls. What can I do on the outside of my home? Regularly fix the edges of walkways and driveways and fix any cracks. Remove anything that might make you trip as you walk through a door, such as a raised step or threshold. Trim any bushes or trees on the path to your home. Use bright outdoor lighting. Clear any walking paths of anything that might make someone trip, such as rocks or tools. Regularly check to see if handrails are loose or broken. Make sure that both sides of any steps have handrails. Any raised decks and porches should have guardrails on the edges. Have any leaves, snow, or ice cleared regularly. Use sand or salt on walking paths during winter. Clean up any spills in your garage right away. This includes oil  or grease spills. What can I do in the bathroom? Use night lights. Install grab bars by the toilet and in the tub and shower. Do not use towel bars as grab bars. Use non-skid mats or decals in the tub or shower. If you need to sit down in the shower, use a plastic, non-slip stool. Keep the floor dry. Clean up any water that spills on the floor as soon as it happens. Remove soap buildup in the tub or shower regularly. Attach bath mats securely with double-sided non-slip rug tape. Do not have throw rugs and other things on the floor that can make you trip. What can I do in the bedroom? Use night lights. Make sure that you have a light by your bed that is easy to reach. Do not use any sheets or blankets that are too big for your bed. They should not hang down onto the floor. Have a firm chair that has side arms. You can use this for support while you get dressed. Do not have throw rugs and other things on the floor that can make you trip. What can I do in the kitchen? Clean up any spills right away. Avoid walking on wet floors. Keep items that you use a lot in easy-to-reach places. If you need to reach something above you, use a strong step stool that has a grab bar. Keep electrical cords out of the way. Do not use floor polish or wax that makes floors slippery. If you must use wax, use non-skid floor wax.  Do not have throw rugs and other things on the floor that can make you trip. What can I do with my stairs? Do not leave any items on the stairs. Make sure that there are handrails on both sides of the stairs and use them. Fix handrails that are broken or loose. Make sure that handrails are as long as the stairways. Check any carpeting to make sure that it is firmly attached to the stairs. Fix any carpet that is loose or worn. Avoid having throw rugs at the top or bottom of the stairs. If you do have throw rugs, attach them to the floor with carpet tape. Make sure that you have a light  switch at the top of the stairs and the bottom of the stairs. If you do not have them, ask someone to add them for you. What else can I do to help prevent falls? Wear shoes that: Do not have high heels. Have rubber bottoms. Are comfortable and fit you well. Are closed at the toe. Do not wear sandals. If you use a stepladder: Make sure that it is fully opened. Do not climb a closed stepladder. Make sure that both sides of the stepladder are locked into place. Ask someone to hold it for you, if possible. Clearly mark and make sure that you can see: Any grab bars or handrails. First and last steps. Where the edge of each step is. Use tools that help you move around (mobility aids) if they are needed. These include: Canes. Walkers. Scooters. Crutches. Turn on the lights when you go into a dark area. Replace any light bulbs as soon as they burn out. Set up your furniture so you have a clear path. Avoid moving your furniture around. If any of your floors are uneven, fix them. If there are any pets around you, be aware of where they are. Review your medicines with your doctor. Some medicines can make you feel dizzy. This can increase your chance of falling. Ask your doctor what other things that you can do to help prevent falls. This information is not intended to replace advice given to you by your health care provider. Make sure you discuss any questions you have with your health care provider. Document Released: 02/03/2009 Document Revised: 09/15/2015 Document Reviewed: 05/14/2014 Elsevier Interactive Patient Education  2017 [employer].

## 2022-06-12 ENCOUNTER — Other Ambulatory Visit: Payer: Self-pay | Admitting: Medical

## 2022-06-12 ENCOUNTER — Telehealth: Payer: Self-pay | Admitting: Medical

## 2022-06-12 MED ORDER — METFORMIN HCL 1000 MG PO TABS: 1000.0000 mg | ORAL_TABLET | Freq: Two times a day (BID) | ORAL | 0 refills | Status: DC

## 2022-06-12 NOTE — Telephone Encounter (Signed)
Rx sent 

## 2022-06-12 NOTE — Telephone Encounter (Signed)
Prescription Request  06/12/2022  Is this a "Controlled Substance" medicine? No  LOV: 04/25/2022  What is the name of the medication or equipment?   metFORMIN (GLUCOPHAGE) 1000 MG tablet [id number]   Have you contacted your pharmacy to request a refill? No   Which pharmacy would you like this sent to?  [id number] [place], [address] AT [address] [place] [address] Phone: [phone] Fax: [phone]    Patient notified that their request is being sent to the clinical staff for review and that they should receive a response within 2 business days.   Please advise at Mobile [phone] (mobile)

## 2022-06-22 ENCOUNTER — Other Ambulatory Visit: Payer: Self-pay | Admitting: Cardiology

## 2022-06-22 ENCOUNTER — Other Ambulatory Visit: Payer: Self-pay | Admitting: Medical

## 2022-07-25 ENCOUNTER — Ambulatory Visit: Payer: Medicare Other | Admitting: Medical

## 2022-08-08 ENCOUNTER — Ambulatory Visit (INDEPENDENT_AMBULATORY_CARE_PROVIDER_SITE_OTHER): Payer: Medicare Other | Admitting: Medical

## 2022-08-08 VITALS — BP 126/64 | HR 83 | Temp 98.0°F | Resp 18 | Ht 67.0 in | Wt 249.0 lb

## 2022-08-08 DIAGNOSIS — R350 Frequency of micturition: Secondary | ICD-10-CM | POA: Diagnosis not present

## 2022-08-08 DIAGNOSIS — R3911 Hesitancy of micturition: Secondary | ICD-10-CM | POA: Diagnosis not present

## 2022-08-08 DIAGNOSIS — N401 Enlarged prostate with lower urinary tract symptoms: Secondary | ICD-10-CM | POA: Diagnosis not present

## 2022-08-08 LAB — POC URINALSYSI DIPSTICK (AUTOMATED)
Bilirubin, UA: NEGATIVE
Blood, UA: NEGATIVE
Glucose, UA: NEGATIVE
Ketones, UA: NEGATIVE
Leukocytes, UA: NEGATIVE
Nitrite, UA: NEGATIVE
Protein, UA: NEGATIVE
Spec Grav, UA: 1.01 (ref 1.010–1.025)
Urobilinogen, UA: 0.2 E.U./dL
pH, UA: 5 (ref 5.0–8.0)

## 2022-08-08 NOTE — Progress Notes (Signed)
Subjective:    Patient ID: Tyler Ellis, male    DOB: 05-25-1950, 72 y.o.   MRN: [id number]  HPI  Pt in for follow up.  Pt tells me that he has seen urologist who gave him dutasteride 0.5 mg every day.   Pt states his urologist states prostate is too large to do described turp.  Also do to his medical problems anesthesiologist recommended not doing surgery one time due to fast heart rate.  Pt stats when he get up in morning has lower abdomen pain but points to suprapubic area. After eating the pain will ease up after he eats. He does states that when urinates flow is slower. Weak stream and takes while to start flow.  On review not reporting any diarrhea, no nausea, no vomiting and no constipation.   Reviewed urologist note plan on potential embolization vs robotic surgery if failed oral treatment.    Review of Systems  Constitutional:  Negative for chills, fatigue and fever.  Respiratory:  Negative for cough, chest tightness, shortness of breath and wheezing.   Cardiovascular:  Negative for chest pain and palpitations.  Gastrointestinal:  Negative for abdominal pain and blood in stool.  Genitourinary:  Positive for difficulty urinating. Negative for frequency, penile pain, scrotal swelling and urgency.       Hesitant urine flow. See hpi.  Musculoskeletal:  Negative for back pain.  Neurological:  Negative for dizziness, speech difficulty, weakness, numbness and headaches.  Hematological:  Negative for adenopathy. Does not bruise/bleed easily.  Psychiatric/Behavioral:  Negative for behavioral problems and confusion.     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   Enlarged prostate with urinary obstruction 05/11/2020   Essential hypertension 03/11/2017    Hypertension    Hyperthyroidism 06/26/2017   Medication management 06/06/2020   Multinodular goiter 07/03/2017   Nocturia 02/02/2014   NSTEMI (non-ST elevated myocardial infarction) (HCC) 02/27/2017   Palpitations 10/28/2019   Paroxysmal atrial fibrillation (HCC) 05/24/2020   Supraventricular tachycardia 02/14/2018   Type 2 diabetes mellitus with complication, without long-term current use of insulin (HCC) 03/11/2017     Social History   Socioeconomic History   Marital status: Divorced    Spouse name: Not on file   Number of children: Not on file   Years of education: Not on file   Highest education level: Not on file  Occupational History   Not on file  Tobacco Use   Smoking status: Never   Smokeless tobacco: Never  Vaping Use   Vaping Use: Never used  Substance and Sexual Activity   Alcohol use: Yes    Comment: weekly   Drug use: No   Sexual activity: Not on file  Other Topics Concern   Not on file  Social History Narrative   Not on file   Social Determinants of Health   Financial Resource Strain: Low Risk  (05/01/2022)   Overall Financial Resource Strain (CARDIA)    Difficulty of Paying Living Expenses: Not hard at all  Food Insecurity: No Food Insecurity (05/01/2022)   Hunger Vital Sign    Worried About Running Out of Food in the Last Year: Never true    Ran Out of Food in the Last Year: Never true  Transportation Needs: No Transportation Needs (05/01/2022)  PRAPARE - Administrator, Civil Service (Medical): No    Lack of Transportation (Non-Medical): No  Physical Activity: Insufficiently Active (05/01/2022)   Exercise Vital Sign    Days of Exercise per Week: 2 days    Minutes of Exercise per Session: 20 min  Stress: No Stress Concern Present (05/01/2022)   [employer] of Occupational Health - Occupational Stress Questionnaire    Feeling of Stress : Not at all  Social Connections: Moderately Isolated (05/01/2022)   Social Connection and Isolation Panel  [NHANES]    Frequency of Communication with Friends and Family: More than three times a week    Frequency of Social Gatherings with Friends and Family: Once a week    Attends Religious Services: More than 4 times per year    Active Member of [employer] or Organizations: No    Attends Banker Meetings: Never    Marital Status: Divorced  Catering manager Violence: Not At Risk (05/01/2022)   Humiliation, Afraid, Rape, and Kick questionnaire    Fear of Current or Ex-Partner: No    Emotionally Abused: No    Physically Abused: No    Sexually Abused: No    Past Surgical History:  Procedure Laterality Date   KNEE SURGERY     LEFT HEART CATH AND CORONARY ANGIOGRAPHY N/A 02/28/2017   Procedure: LEFT HEART CATH AND CORONARY ANGIOGRAPHY;  Surgeon: Kathleene Hazel, MD;  Location: [facility];  Service: Cardiovascular;  Laterality: N/A;    Family History  Problem Relation Age of Onset   Hypertension Father    Hypertension Mother     No Known Allergies  Current Outpatient Medications on File Prior to Visit  Medication Sig Dispense Refill   acetaminophen-codeine (TYLENOL #3) 300-30 MG tablet Take 1 tablet every 8 (eight) hours as needed by mouth for moderate pain.      ALPRAZolam (XANAX) 0.5 MG tablet Take 0.25-0.5 mg by mouth 3 (three) times daily as needed for anxiety.     apixaban (ELIQUIS) 5 MG TABS tablet Take 1 tablet (5 mg total) by mouth 2 (two) times daily. 180 tablet 1   atorvastatin (LIPITOR) 80 MG tablet TAKE 1 TABLET(80 MG) BY MOUTH DAILY 30 tablet 3   cloNIDine (CATAPRES) 0.1 MG tablet Take 1 tablet (0.1 mg total) by mouth daily. 90 tablet 3   diltiazem (CARDIZEM CD) 360 MG 24 hr capsule TAKE 1 CAPSULE(360 MG) BY MOUTH DAILY (Patient taking differently: Take 360 mg by mouth daily.) 90 capsule 3   esomeprazole (NEXIUM) 20 MG capsule Take 20 mg by mouth daily at 12 noon.     ezetimibe (ZETIA) 10 MG tablet Take 1 tablet (10 mg total) by mouth daily. 90 tablet 2    flecainide (TAMBOCOR) 50 MG tablet TAKE 1 TABLET(50 MG) BY MOUTH TWICE DAILY (Patient taking differently: Take 50 mg by mouth 2 (two) times daily.) 60 tablet 5   glipiZIDE (GLUCOTROL) 10 MG tablet Take 10 mg 2 (two) times daily before a meal by mouth.      guaiFENesin (MUCINEX) 600 MG 12 hr tablet Take 600 mg by mouth daily as needed for to loosen phlegm or cough.     hydrOXYzine (ATARAX/VISTARIL) 10 MG tablet Take 10 mg by mouth in the morning and at bedtime.     LANTUS SOLOSTAR 100 UNIT/ML Solostar Pen Inject 50 Units into the skin at bedtime.     losartan (COZAAR) 50 MG tablet TAKE 1 TABLET(50 MG) BY MOUTH DAILY 90  tablet 3   metFORMIN (GLUCOPHAGE) 1000 MG tablet TAKE 1 TABLET(1000 MG) BY MOUTH TWICE DAILY 180 tablet 0   metoprolol (TOPROL-XL) 200 MG 24 hr tablet Take 1 tablet (200 mg total) by mouth daily. 90 tablet 1   nitroGLYCERIN (NITROSTAT) 0.4 MG SL tablet Place 1 tablet (0.4 mg total) under the tongue every 5 (five) minutes as needed for chest pain. 25 tablet 2   OVER THE COUNTER MEDICATION Take 1 tablet by mouth daily. Beta Prostate     potassium chloride (KLOR-CON) 10 MEQ tablet Take 1 tablet (10 mEq total) by mouth daily. 90 tablet 2   tamsulosin (FLOMAX) 0.4 MG CAPS capsule Take 0.4 mg by mouth daily.     torsemide (DEMADEX) 20 MG tablet Take 1 tablet (20 mg total) by mouth daily. 90 tablet 2   No current facility-administered medications on file prior to visit.    BP 126/64   Pulse 83   Temp 98 F (36.7 C)   Resp 18   Ht  (1.702 m)   Wt 249 lb (112.9 kg)   SpO2 94%   BMI 39.00 kg/m        Objective:   Physical Exam  General Mental Status- Alert. General Appearance- Not in acute distress.   Skin General: Color- Normal Color. Moisture- Normal Moisture.  Neck Carotid Arteries- Normal color. Moisture- Normal Moisture. No carotid bruits. No JVD.  Chest and Lung Exam Auscultation: Breath Sounds:-Normal.  Cardiovascular Auscultation:Rythm-  Regular. Murmurs & Other Heart Sounds:Auscultation of the heart reveals- No Murmurs.  Abdomen Inspection:-Inspeection Normal. Palpation/Percussion:Note:No mass. Palpation and Percussion of the abdomen reveal- Non Tender, Non Distended + BS, no rebound or guarding. No suprapubic tenderness.  Neurologic Cranial Nerve exam:- CN III-XII intact(No nystagmus), symmetric smile. Strength:- 5/5 equal and symmetric strength both upper and lower extremities.       Assessment & Plan:   Patient Instructions  Bph symptoms not improving despite 3 months of dutasteride. Some suprapubic pain daily in morning. Considering you are not emptying the bladder fully. I would recommend that you get back in with urologist as you may need to discuss the embolization procedure option.   Today psa, urine poct and urine culture.  Referral back to urologist. Advise that you call if they don't you by next wed.  Follow up date to be determined after lab review.    Esperanza Richters, PA-C

## 2022-08-08 NOTE — Patient Instructions (Addendum)
Bph symptoms not improving despite 3 months of dutasteride. Some suprapubic pain daily in morning. Considering you are not emptying the bladder fully. I would recommend that you get back in with urologist as you may need to discuss the embolization procedure option.   Today psa, urine poct and urine culture.  Referral back to urologist. Advise that you call if they don't you by next wed.  Follow up date to be determined after lab review.

## 2022-08-09 LAB — URINE CULTURE
MICRO NUMBER:: 14837296
SPECIMEN QUALITY:: ADEQUATE

## 2022-08-09 LAB — PSA: PSA: 4.07 ng/mL — ABNORMAL HIGH (ref 0.10–4.00)

## 2022-08-21 ENCOUNTER — Ambulatory Visit: Payer: Medicare Other | Admitting: Urology

## 2022-08-21 ENCOUNTER — Encounter: Payer: Self-pay | Admitting: Urology

## 2022-08-21 VITALS — BP 154/90 | HR 101 | Ht 67.0 in | Wt 250.0 lb

## 2022-08-21 DIAGNOSIS — N138 Other obstructive and reflux uropathy: Secondary | ICD-10-CM | POA: Diagnosis not present

## 2022-08-21 DIAGNOSIS — N401 Enlarged prostate with lower urinary tract symptoms: Secondary | ICD-10-CM

## 2022-08-21 LAB — URINALYSIS, ROUTINE W REFLEX MICROSCOPIC
Bilirubin, UA: NEGATIVE
Glucose, UA: NEGATIVE
Ketones, UA: NEGATIVE
Leukocytes,UA: NEGATIVE
Nitrite, UA: NEGATIVE
RBC, UA: NEGATIVE
Specific Gravity, UA: 1.015 (ref 1.005–1.030)
Urobilinogen, Ur: 0.2 mg/dL (ref 0.2–1.0)
pH, UA: 5.5 (ref 5.0–7.5)

## 2022-08-21 LAB — MICROSCOPIC EXAMINATION
Cast Type: NONE SEEN
Casts: NONE SEEN /lpf
Crystal Type: NONE SEEN
Crystals: NONE SEEN
Mucus, UA: NONE SEEN
Renal Epithel, UA: NONE SEEN /hpf
Trichomonas, UA: NONE SEEN
Yeast, UA: NONE SEEN

## 2022-08-21 LAB — BLADDER SCAN AMB NON-IMAGING

## 2022-08-21 NOTE — Progress Notes (Addendum)
Assessment: 1. Enlarged prostate with urinary obstruction      Plan: Today I had a long discussion with the patient regarding his current medical management using dutasteride.  I explained to him that it is a medicine that works very gradually in most patients do not perceive significant benefit until they have been on it 6 months and that maximal shrinkage often does not occur until 24 months.  I also recommend that he restart the tamsulosin which he discontinued thinking that he only needed to be on the 1 medication. Patient will follow-up with Dr. Alvester Morin which is already scheduled for later this summer.  Chief Complaint:  Chief Complaint  Patient presents with   Benign Prostatic Hypertrophy    History of Present Illness:  Tyler Ellis is a 72 y.o. male who is seen in consultation from [place], Ramon Dredge, [place] for evaluation of longstanding BPH.  Patient is an established patient of Dr. Modena Slater at [facility] urology.  He last saw him in January 2024 and has a follow-up appointment with him later this summer.  Patient has a very large prostate estimated to be 170 g on ultrasound.  Dr. Alvester Morin had recently started him on dutasteride (he did not tolerate finasteride).  Patient is a very high risk surgical patient due to multiple serious medical comorbidities including coronary artery disease and requirement for anticoagulation.  Dr. Alvester Morin has discussed with him potentially prostate embolization if invasive intervention is warranted.  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   Enlarged prostate with urinary obstruction 05/11/2020   Essential hypertension 03/11/2017   Hypertension    Hyperthyroidism 06/26/2017    Medication management 06/06/2020   Multinodular goiter 07/03/2017   Nocturia 02/02/2014   NSTEMI (non-ST elevated myocardial infarction) (HCC) 02/27/2017   Palpitations 10/28/2019   Paroxysmal atrial fibrillation (HCC) 05/24/2020   Supraventricular tachycardia 02/14/2018   Type 2 diabetes mellitus with complication, without long-term current use of insulin (HCC) 03/11/2017    Past Surgical History:  Past Surgical History:  Procedure Laterality Date   KNEE SURGERY     LEFT HEART CATH AND CORONARY ANGIOGRAPHY N/A 02/28/2017   Procedure: LEFT HEART CATH AND CORONARY ANGIOGRAPHY;  Surgeon: Kathleene Hazel, MD;  Location: [facility];  Service: Cardiovascular;  Laterality: N/A;    Allergies:  No Known Allergies  Family History:  Family History  Problem Relation Age of Onset   Hypertension Father    Hypertension Mother     Social History:  Social History   Tobacco Use   Smoking status: Never   Smokeless tobacco: Never  Vaping Use   Vaping Use: Never used  Substance Use Topics   Alcohol use: Yes    Comment: weekly   Drug use: No    Review of symptoms:  Constitutional:  Negative for unexplained weight loss, night sweats, fever, chills ENT:  Negative for nose bleeds, sinus pain, painful swallowing CV:  Negative for chest pain, shortness of breath, exercise intolerance, palpitations, loss of consciousness Resp:  Negative for cough, wheezing, shortness of breath GI:  Negative for nausea, vomiting, diarrhea, bloody stools GU:  Positives noted in HPI; otherwise negative for gross hematuria, dysuria, urinary incontinence Neuro:  Negative  for seizures, poor balance, limb weakness, slurred speech Psych:  Negative for lack of energy, depression, anxiety Endocrine:  Negative for polydipsia, polyuria, symptoms of hypoglycemia (dizziness, hunger, sweating) Hematologic:  Negative for anemia, purpura, petechia, prolonged or excessive bleeding, use of anticoagulants  Allergic:   Negative for difficulty breathing or choking as a result of exposure to anything; no shellfish allergy; no allergic response (rash/itch) to materials, foods  Physical exam: BP (!) 154/90   Pulse (!) 101   Ht 5\' 7"  (1.702 m)   Wt 250 lb (113.4 kg)   BMI 39.16 kg/m  GENERAL APPEARANCE:  Well appearing, well developed, well nourished, NAD   Results: Results for orders placed or performed in visit on 08/21/22 (from the past 24 hour(s))  BLADDER SCAN AMB NON-IMAGING   Collection Time: 08/21/22 10:53 AM  Result Value Ref Range   Scan Result 

## 2022-08-27 ENCOUNTER — Telehealth: Payer: Self-pay | Admitting: Medical

## 2022-08-27 ENCOUNTER — Other Ambulatory Visit: Payer: Self-pay | Admitting: Medical

## 2022-08-27 ENCOUNTER — Telehealth: Payer: Self-pay | Admitting: Urology

## 2022-08-27 MED ORDER — ACCU-CHEK AVIVA PLUS VI STRP: ORAL_STRIP | 1 refills | Status: AC

## 2022-08-27 MED ORDER — TAMSULOSIN HCL 0.4 MG PO CAPS: 0.4000 mg | ORAL_CAPSULE | Freq: Every day | ORAL | 3 refills | Status: DC

## 2022-08-27 NOTE — Telephone Encounter (Signed)
Patient called and LVM stating that he was in for an appt on 08/21/2022 and that Dr Margo Aye was supposed to send in a medication to pharmacy below and patient's pharmacy does not have medication. Patient did not leave which medication on voicemail.   [id number] - [place], [place] - [address] AT [facility] OF [address] Phone: [phone]  Fax: [phone]

## 2022-08-27 NOTE — Telephone Encounter (Signed)
Patient stated he uses Accu check Aviva plus test strips.  Rx for test strips sent into pharmacy.

## 2022-08-27 NOTE — Telephone Encounter (Signed)
Patient called back during lunch and left a voicemail returning your call & would like a call back.

## 2022-08-27 NOTE — Addendum Note (Signed)
Addended by: Thelma Barge D on: 08/27/2022 12:51 PM   Modules accepted: Orders

## 2022-08-27 NOTE — Telephone Encounter (Signed)
Left msg for a return call from pt.

## 2022-08-27 NOTE — Telephone Encounter (Signed)
Prescription Request  08/27/2022  Is this a "Controlled Substance" medicine? No  LOV: 08/08/2022  What is the name of the medication or equipment? Diabetic Testing Strips   Have you contacted your pharmacy to request a refill? Yes   Which pharmacy would you like this sent to?  [id number] - [place], [place] - [address] AT [facility] OF [address] [place] [address] Phone: [phone] Fax: [phone]    Patient notified that their request is being sent to the clinical staff for review and that they should receive a response within 2 business days.   Please advise at Mobile [phone] (mobile)

## 2022-08-27 NOTE — Telephone Encounter (Signed)
Spoke with patient, he is aware that a new Rx was sent to the pharmacy and that he needs to take both medications. He verbalized understanding.

## 2022-08-27 NOTE — Addendum Note (Signed)
Addended by: Marcha Solders C on: 08/27/2022 10:13 AM   Modules accepted: Orders

## 2022-08-31 ENCOUNTER — Other Ambulatory Visit: Payer: Self-pay

## 2022-08-31 NOTE — Patient Outreach (Signed)
Aging Gracefully Program  08/31/2022  Tyler Ellis [birth year] [id number]   [facility] Evaluation Interviewer attempted to call patient on today regarding Aging Gracefully referral. No answer from patient after multiple rings. CMA left confidential voicemail for patient to return call.  Will attempt to call back within 1 week.   Vanice Sarah Care Management Assistant [phone]

## 2022-09-05 ENCOUNTER — Other Ambulatory Visit: Payer: Self-pay

## 2022-09-05 ENCOUNTER — Telehealth: Payer: Self-pay | Admitting: Medical

## 2022-09-05 NOTE — Telephone Encounter (Signed)
Pt thought he should have a follow up scheduled with Ramon Dredge after labs. He received his lab results but it only mentions his urology appt. Pt is unsure if Ramon Dredge needs him to come back in 3 months or 6 months. Please advise when he should come back

## 2022-09-05 NOTE — Patient Outreach (Signed)
Aging Gracefully Program  09/05/2022  Tyler Ellis 07-13-1950 [id number]   [facility] Evaluation Interviewer attempted to call patient on today regarding Aging Gracefully referral. No answer as phone sent me straight to voicemail. CMA left confidential voicemail for patient to return call.  Will attempt to call back within 1 week.   Vanice Sarah Care Management Assistant [phone]

## 2022-09-06 NOTE — Telephone Encounter (Signed)
Appt scheduled

## 2022-09-10 ENCOUNTER — Other Ambulatory Visit: Payer: Self-pay

## 2022-09-10 NOTE — Patient Outreach (Signed)
Aging Gracefully Program  09/10/2022  Tyler Ellis 16-Sep-1950 [id number]   [facility] Evaluation Interviewer made contact with patient. Aging Gracefully 9 month survey completed.   Vanice Sarah Care Management Assistant [phone]

## 2022-09-16 ENCOUNTER — Other Ambulatory Visit: Payer: Self-pay | Admitting: Medical

## 2022-10-04 ENCOUNTER — Other Ambulatory Visit: Payer: Self-pay | Admitting: Medical

## 2022-10-06 ENCOUNTER — Other Ambulatory Visit: Payer: Self-pay | Admitting: Cardiology

## 2022-10-09 ENCOUNTER — Ambulatory Visit: Payer: Medicare Other | Admitting: Cardiology

## 2022-10-09 ENCOUNTER — Encounter: Payer: Self-pay | Admitting: Cardiology

## 2022-10-09 ENCOUNTER — Ambulatory Visit: Payer: Medicare Other | Attending: Cardiology | Admitting: Cardiology

## 2022-10-09 VITALS — BP 130/80 | HR 77 | Ht 67.0 in | Wt 245.0 lb

## 2022-10-09 DIAGNOSIS — I471 Supraventricular tachycardia, unspecified: Secondary | ICD-10-CM

## 2022-10-09 DIAGNOSIS — E118 Type 2 diabetes mellitus with unspecified complications: Secondary | ICD-10-CM

## 2022-10-09 DIAGNOSIS — I480 Paroxysmal atrial fibrillation: Secondary | ICD-10-CM

## 2022-10-09 DIAGNOSIS — I2510 Atherosclerotic heart disease of native coronary artery without angina pectoris: Secondary | ICD-10-CM

## 2022-10-09 DIAGNOSIS — Z79899 Other long term (current) drug therapy: Secondary | ICD-10-CM | POA: Diagnosis not present

## 2022-10-09 DIAGNOSIS — I10 Essential (primary) hypertension: Secondary | ICD-10-CM

## 2022-10-09 NOTE — Progress Notes (Signed)
Cardiology Office Note:    Date:  10/09/2022   ID:  Tyler Ellis, DOB 1950-06-13, MRN [id number]  PCP:  Esperanza Richters, PA-C  Cardiologist:  Gypsy Balsam, MD    Referring MD: Marisue Brooklyn   Chief Complaint  Patient presents with   Bilateral arm numbness    History of Present Illness:    Tyler Ellis is a 72 y.o. male past medical history significant for coronary artery disease.  In 2019 he had cardiac catheterization which showed 10% proximal 10% mid RCA.  Since that time he did have a stress test which was negative, additional problem include paroxysmal atrial fibrillation he is on flecainide as well as anticoagulation, essential hypertension, diabetes, dyslipidemia. Comes today to months for follow-up he complaining of having some numbness in his both hands that happened during the night or more he may be having carpal tunnel syndrome.  He does not have any of this sensation when he walks or climbs stairs.  Denies have any cardiac complaints rare palpitation short lasting no chest pain tightness squeezing pressure in chest.  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   Enlarged prostate with urinary obstruction 05/11/2020   Essential hypertension 03/11/2017   Hypertension    Hyperthyroidism 06/26/2017   Medication management 06/06/2020   Multinodular goiter 07/03/2017   Nocturia 02/02/2014   NSTEMI (non-ST elevated myocardial infarction) (HCC) 02/27/2017   Palpitations 10/28/2019   Paroxysmal atrial fibrillation (HCC) 05/24/2020   Supraventricular tachycardia 02/14/2018   Type 2 diabetes mellitus with complication, without long-term current use of insulin (HCC) 03/11/2017    Past Surgical History:  Procedure Laterality Date   KNEE SURGERY     LEFT  HEART CATH AND CORONARY ANGIOGRAPHY N/A 02/28/2017   Procedure: LEFT HEART CATH AND CORONARY ANGIOGRAPHY;  Surgeon: Kathleene Hazel, MD;  Location: [facility];  Service: Cardiovascular;  Laterality: N/A;    Current Medications: Current Meds  Medication Sig   ALPRAZolam (XANAX) 0.5 MG tablet Take 0.25-0.5 mg by mouth 3 (three) times daily as needed for anxiety.   apixaban (ELIQUIS) 5 MG TABS tablet Take 1 tablet (5 mg total) by mouth 2 (two) times daily.   atorvastatin (LIPITOR) 80 MG tablet Take 1 tablet (80 mg total) by mouth daily.   cloNIDine (CATAPRES) 0.1 MG tablet Take 1 tablet (0.1 mg total) by mouth daily.   diltiazem (CARDIZEM CD) 360 MG 24 hr capsule TAKE 1 CAPSULE(360 MG) BY MOUTH DAILY (Patient taking differently: Take 360 mg by mouth daily.)   esomeprazole (NEXIUM) 20 MG capsule Take 20 mg by mouth daily at 12 noon.   ezetimibe (ZETIA) 10 MG tablet Take 1 tablet (10 mg total) by mouth daily.   flecainide (TAMBOCOR) 50 MG tablet TAKE 1 TABLET(50 MG) BY MOUTH TWICE DAILY (Patient taking differently: Take 50 mg by mouth 2 (two) times daily.)   glipiZIDE (GLUCOTROL) 10 MG tablet TAKE 1 TABLET BY MOUTH TWICE DAILY   glucose blood (ACCU-CHEK AVIVA PLUS) test strip Check blood sugar twice a day.  Dx: E11.9 (Patient taking differently: 1 each by Other route as needed for other (see below). Check blood sugar twice a day.  Dx: E11.9)   guaiFENesin (MUCINEX) 600 MG 12 hr tablet Take 600 mg by mouth daily as needed  for to loosen phlegm or cough.   hydrOXYzine (ATARAX/VISTARIL) 10 MG tablet Take 10 mg by mouth in the morning and at bedtime.   insulin glargine (LANTUS SOLOSTAR) 100 UNIT/ML Solostar Pen INJECT 30 UNITS UNDER SKIN EVERY MORNING AND 50 UNITS EVERY EVENING (Patient taking differently: Inject 30-50 Units into the skin See admin instructions. 30 units am and 50 units pm)   losartan (COZAAR) 50 MG tablet TAKE 1 TABLET(50 MG) BY MOUTH DAILY (Patient taking differently: Take  50 mg by mouth daily.)   metFORMIN (GLUCOPHAGE) 1000 MG tablet TAKE 1 TABLET(1000 MG) BY MOUTH TWICE DAILY (Patient taking differently: Take 1,000 mg by mouth 2 (two) times daily with a meal.)   metoprolol (TOPROL-XL) 200 MG 24 hr tablet Take 1 tablet (200 mg total) by mouth daily.   OVER THE COUNTER MEDICATION Take 1 tablet by mouth daily. Beta Prostate   potassium chloride (KLOR-CON) 10 MEQ tablet Take 1 tablet (10 mEq total) by mouth daily.   tamsulosin (FLOMAX) 0.4 MG CAPS capsule Take 1 capsule (0.4 mg total) by mouth daily after supper.   torsemide (DEMADEX) 20 MG tablet Take 1 tablet (20 mg total) by mouth daily.     Allergies:   Patient has no known allergies.   Social History   Socioeconomic History   Marital status: Divorced    Spouse name: Not on file   Number of children: Not on file   Years of education: Not on file   Highest education level: Not on file  Occupational History   Not on file  Tobacco Use   Smoking status: Never   Smokeless tobacco: Never  Vaping Use   Vaping Use: Never used  Substance and Sexual Activity   Alcohol use: Yes    Comment: weekly   Drug use: No   Sexual activity: Not on file  Other Topics Concern   Not on file  Social History Narrative   Not on file   Social Determinants of Health   Financial Resource Strain: Low Risk  (05/01/2022)   Overall Financial Resource Strain (CARDIA)    Difficulty of Paying Living Expenses: Not hard at all  Food Insecurity: No Food Insecurity (05/01/2022)   Hunger Vital Sign    Worried About Running Out of Food in the Last Year: Never true    Ran Out of Food in the Last Year: Never true  Transportation Needs: No Transportation Needs (05/01/2022)   PRAPARE - Administrator, Civil Service (Medical): No    Lack of Transportation (Non-Medical): No  Physical Activity: Insufficiently Active (05/01/2022)   Exercise Vital Sign    Days of Exercise per Week: 2 days    Minutes of Exercise per Session: 20 min   Stress: No Stress Concern Present (05/01/2022)   [employer] of Occupational Health - Occupational Stress Questionnaire    Feeling of Stress : Not at all  Social Connections: Moderately Isolated (05/01/2022)   Social Connection and Isolation Panel [NHANES]    Frequency of Communication with Friends and Family: More than three times a week    Frequency of Social Gatherings with Friends and Family: Once a week    Attends Religious Services: More than 4 times per year    Active Member of [employer] or Organizations: No    Attends Banker Meetings: Never    Marital Status: Divorced     Family History: The patient's family history includes Hypertension in his father and mother. ROS:   Please see  the history of present illness.    All 14 point review of systems negative except as described per history of present illness  EKGs/Labs/Other Studies Reviewed:      Recent Labs: 11/27/2021: TSH 0.78 12/26/2021: Pro B Natriuretic peptide (BNP) 29.0 01/02/2022: Hemoglobin 15.2; Platelets 177.0 04/25/2022: ALT 16; BUN 16; Creatinine, Ser 1.26; Potassium 4.5; Sodium 137  Recent Lipid Panel    Component Value Date/Time   CHOL 114 11/27/2021 0827   CHOL 118 03/23/2021 1515   TRIG 112.0 11/27/2021 0827   HDL 38.70 (L) 11/27/2021 0827   HDL 43 03/23/2021 1515   CHOLHDL 3 11/27/2021 0827   VLDL 22.4 11/27/2021 0827   LDLCALC 53 11/27/2021 0827   LDLCALC 63 03/23/2021 1515    Physical Exam:    VS:  BP 130/80 (BP Location: Left Arm, Patient Position: Sitting)   Pulse 77   Ht 5\' 7"  (1.702 m)   Wt 245 lb (111.1 kg)   SpO2 97%   BMI 38.37 kg/m     Wt Readings from Last 3 Encounters:  10/09/22 245 lb (111.1 kg)  08/21/22 250 lb (113.4 kg)  08/08/22 249 lb (112.9 kg)     GEN:  Well nourished, well developed in no acute distress HEENT: Normal NECK: No JVD; No carotid bruits LYMPHATICS: No lymphadenopathy CARDIAC: RRR, no murmurs, no rubs, no gallops RESPIRATORY:  Clear to  auscultation without rales, wheezing or rhonchi  ABDOMEN: Soft, non-tender, non-distended MUSCULOSKELETAL:  No edema; No deformity  SKIN: Warm and dry LOWER EXTREMITIES: no swelling NEUROLOGIC:  Alert and oriented x 3 PSYCHIATRIC:  Normal affect   ASSESSMENT:    1. Medication management   2. Paroxysmal atrial fibrillation (HCC)   3. Supraventricular tachycardia   4. Essential hypertension   5. Coronary artery disease involving native coronary artery of native heart without angina pectoris   6. Type 2 diabetes mellitus with complication, without long-term current use of insulin (HCC)    PLAN:    In order of problems listed above:  Paroxysmal atrial fibrillation he is anticoagulated continue with flecainide.  He is QRS complex duration is normal PR interval is normal continue monitoring. History of supraventricular tachycardia rare episodes monitor reviewed done last year which did not show any significant arrhythmia. Essential hypertension blood pressure controlled continue present management. Dyslipidemia I did review K PN which show me data from last year with LDL 53 HDL 38 good cholesterol control with Lipitor 80 which is high intense statin. He was inquiring about potentially starting Ozempic which probably is a good idea it will be helpful for him to have weight loss.  He will talk about this to his primary care physician   Medication Adjustments/Labs and Tests Ordered: Current medicines are reviewed at length with the patient today.  Concerns regarding medicines are outlined above.  Orders Placed This Encounter  Procedures   EKG 12-Lead   Medication changes: No orders of the defined types were placed in this encounter.   Signed, Georgeanna Lea, MD, [facility] 10/09/2022 10:29 AM    [facility]

## 2022-10-09 NOTE — Patient Instructions (Signed)

## 2022-11-01 DIAGNOSIS — R350 Frequency of micturition: Secondary | ICD-10-CM | POA: Diagnosis not present

## 2022-11-01 DIAGNOSIS — R3912 Poor urinary stream: Secondary | ICD-10-CM | POA: Diagnosis not present

## 2022-11-01 DIAGNOSIS — R3915 Urgency of urination: Secondary | ICD-10-CM | POA: Diagnosis not present

## 2022-11-03 ENCOUNTER — Other Ambulatory Visit: Payer: Self-pay | Admitting: Cardiology

## 2022-11-08 ENCOUNTER — Other Ambulatory Visit: Payer: Self-pay | Admitting: Medical

## 2022-11-08 ENCOUNTER — Other Ambulatory Visit: Payer: Self-pay | Admitting: Cardiology

## 2022-11-12 ENCOUNTER — Other Ambulatory Visit: Payer: Self-pay | Admitting: Medical

## 2022-11-21 ENCOUNTER — Encounter (INDEPENDENT_AMBULATORY_CARE_PROVIDER_SITE_OTHER): Payer: Self-pay

## 2022-11-23 ENCOUNTER — Other Ambulatory Visit: Payer: Self-pay | Admitting: Medical

## 2022-11-27 ENCOUNTER — Telehealth: Payer: Self-pay | Admitting: Cardiology

## 2022-11-27 NOTE — Telephone Encounter (Signed)
Pt c/o medication issue:  1. Name of Medication: diltiazem (CARDIZEM CD) 360 MG 24 hr capsule   2. How are you currently taking this medication (dosage and times per day)? TAKE 1 CAPSULE(360 MG) BY MOUTH DAILYPatient taking differently: Take 360 mg by mouth daily.   3. Are you having a reaction (difficulty breathing--STAT)? No  4. What is your medication issue? Patient has been needing this medication for over a month now. The pharmacy stated the medication has been on back order. Patient stated the pharmacy mentioned that their could be another type of medication that can work the same. Please advise.

## 2022-11-29 ENCOUNTER — Other Ambulatory Visit (HOSPITAL_BASED_OUTPATIENT_CLINIC_OR_DEPARTMENT_OTHER): Payer: Self-pay

## 2022-11-29 ENCOUNTER — Other Ambulatory Visit: Payer: Self-pay

## 2022-11-29 MED ORDER — DILTIAZEM HCL ER COATED BEADS 360 MG PO CP24
360.0000 mg | ORAL_CAPSULE | Freq: Every day | ORAL | 3 refills | Status: DC
  Filled 2022-11-29: qty 90, 90d supply, fill #0
  Filled 2023-06-02: qty 90, 90d supply, fill #1
  Filled 2023-08-22: qty 90, 90d supply, fill #2

## 2022-11-29 MED ORDER — DILTIAZEM HCL ER COATED BEADS 360 MG PO CP24: 360.0000 mg | ORAL_CAPSULE | Freq: Every day | ORAL | 3 refills | Status: DC

## 2022-12-06 ENCOUNTER — Ambulatory Visit (INDEPENDENT_AMBULATORY_CARE_PROVIDER_SITE_OTHER): Payer: Medicare Other | Admitting: Medical

## 2022-12-06 ENCOUNTER — Encounter: Payer: Self-pay | Admitting: Medical

## 2022-12-06 VITALS — BP 122/62 | HR 88 | Ht 67.0 in | Wt 247.0 lb

## 2022-12-06 DIAGNOSIS — I10 Essential (primary) hypertension: Secondary | ICD-10-CM | POA: Diagnosis not present

## 2022-12-06 DIAGNOSIS — R350 Frequency of micturition: Secondary | ICD-10-CM | POA: Diagnosis not present

## 2022-12-06 DIAGNOSIS — E010 Iodine-deficiency related diffuse (endemic) goiter: Secondary | ICD-10-CM | POA: Diagnosis not present

## 2022-12-06 DIAGNOSIS — I480 Paroxysmal atrial fibrillation: Secondary | ICD-10-CM | POA: Diagnosis not present

## 2022-12-06 DIAGNOSIS — G4700 Insomnia, unspecified: Secondary | ICD-10-CM | POA: Diagnosis not present

## 2022-12-06 DIAGNOSIS — E118 Type 2 diabetes mellitus with unspecified complications: Secondary | ICD-10-CM | POA: Diagnosis not present

## 2022-12-06 DIAGNOSIS — E041 Nontoxic single thyroid nodule: Secondary | ICD-10-CM

## 2022-12-06 DIAGNOSIS — I2510 Atherosclerotic heart disease of native coronary artery without angina pectoris: Secondary | ICD-10-CM

## 2022-12-06 DIAGNOSIS — Z7984 Long term (current) use of oral hypoglycemic drugs: Secondary | ICD-10-CM

## 2022-12-06 DIAGNOSIS — N401 Enlarged prostate with lower urinary tract symptoms: Secondary | ICD-10-CM

## 2022-12-06 LAB — COMPREHENSIVE METABOLIC PANEL
ALT: 16 U/L (ref 0–53)
AST: 14 U/L (ref 0–37)
Albumin: 4.5 g/dL (ref 3.5–5.2)
Alkaline Phosphatase: 87 U/L (ref 39–117)
BUN: 12 mg/dL (ref 6–23)
CO2: 24 mEq/L (ref 19–32)
Calcium: 9.4 mg/dL (ref 8.4–10.5)
Chloride: 102 mEq/L (ref 96–112)
Creatinine, Ser: 0.99 mg/dL (ref 0.40–1.50)
GFR: 76.37 mL/min (ref >60.00)
Glucose, Bld: 155 mg/dL — ABNORMAL HIGH (ref 70–99)
Potassium: 4.1 mEq/L (ref 3.5–5.1)
Sodium: 138 mEq/L (ref 135–145)
Total Bilirubin: 0.6 mg/dL (ref 0.2–1.2)
Total Protein: 6.9 g/dL (ref 6.0–8.3)

## 2022-12-06 LAB — T4, FREE: Free T4: 0.99 ng/dL (ref 0.60–1.60)

## 2022-12-06 LAB — HEMOGLOBIN A1C: Hgb A1c MFr Bld: 7.8 % — ABNORMAL HIGH (ref 4.6–6.5)

## 2022-12-06 LAB — TSH: TSH: 0.55 u[IU]/mL (ref 0.35–5.50)

## 2022-12-06 MED ORDER — TRAZODONE HCL 50 MG PO TABS: 25.0000 mg | ORAL_TABLET | Freq: Every evening | ORAL | 3 refills | Status: DC | PRN

## 2022-12-06 NOTE — Patient Instructions (Addendum)
1. Benign prostatic hyperplasia with urinary frequency Continue meds prescribed by urologist.  2. Type 2 diabetes mellitus with complication, without long-term current use of insulin (HCC) - metformin 1000 mg twice daily and on glipizide 10 mg daily. Continue lantus as well -cmp -A1c today.  3. Hypertension, unspecified type -bp well controlled today. On losartan 50 mg daily.  4. Paroxysmal atrial fibrillation (HCC) -rate controlled today flecainide, cardizem  and toprol xl. Continue eliquis  5. Coronary artery disease involving native coronary artery of native heart without angina pectoris. -no angina reported.  6. Insomnia- rx trazadone 50 mg at night.  7. Thyromegaly on exam- also mild soreness to rt side thyroid area.     Follow up date to be determined after lab review

## 2022-12-06 NOTE — Progress Notes (Signed)
Subjective:    Patient ID: Tyler Ellis, male    DOB: Nov 08, 1950, 72 y.o.   MRN: [id number]  HPI  Pt in for follow up.  On review I see he has seen Dr. Dewayne Shorter.  ASSESSMENT:     1. Medication management   2. Paroxysmal atrial fibrillation (HCC)   3. Supraventricular tachycardia   4. Essential hypertension   5. Coronary artery disease involving native coronary artery of native heart without angina pectoris   6. Type 2 diabetes mellitus with complication, without long-term current use of insulin (HCC)     PLAN:     In order of problems listed above:   Paroxysmal atrial fibrillation he is anticoagulated continue with flecainide.  He is QRS complex duration is normal PR interval is normal continue monitoring. History of supraventricular tachycardia rare episodes monitor reviewed done last year which did not show any significant arrhythmia. Essential hypertension blood pressure controlled continue present management. Dyslipidemia I did review K PN which show me data from last year with LDL 53 HDL 38 good cholesterol control with Lipitor 80 which is high intense statin. He was inquiring about potentially starting Ozempic which probably is a good idea it will be helpful for him to have weight loss.  He will talk about this to his primary care physician   Pt has bph. He states urinary symtpoms improved and being follow by urologist.   Diabetes- check A1c and cmp today. On metformin 1000 mg twice daily and on glipizide 10 mg daily. Continue lantus as well. He states uses 50 units daily per his report.   Review of Systems  Constitutional:  Negative for chills, fatigue and fever.  HENT:  Negative for congestion, ear discharge, facial swelling, sinus pressure and sinus pain.   Respiratory:  Negative for cough, chest tightness, shortness of breath and wheezing.   Cardiovascular:  Negative for chest pain and palpitations.  Gastrointestinal:  Negative for abdominal pain, blood in stool,  diarrhea and rectal pain.  Genitourinary:  Negative for dysuria, frequency and hematuria.  Musculoskeletal:  Negative for back pain, myalgias and neck pain.  Skin:  Negative for rash.  Neurological:  Negative for dizziness, speech difficulty, weakness, numbness and headaches.  Hematological:  Negative for adenopathy. Does not bruise/bleed easily.    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   Enlarged prostate with urinary obstruction 05/11/2020   Essential hypertension 03/11/2017   Hypertension    Hyperthyroidism 06/26/2017   Medication management 06/06/2020   Multinodular goiter 07/03/2017   Nocturia 02/02/2014   NSTEMI (non-ST elevated myocardial infarction) (HCC) 02/27/2017   Palpitations 10/28/2019   Paroxysmal atrial fibrillation (HCC) 05/24/2020   Supraventricular tachycardia 02/14/2018   Type 2 diabetes mellitus with complication, without long-term current use of insulin (HCC) 03/11/2017     Social History   Socioeconomic History   Marital status: Divorced    Spouse name: Not on file   Number of children: Not on file   Years of education: Not on file   Highest education level: Not on file  Occupational History   Not on file  Tobacco Use   Smoking status: Never   Smokeless tobacco: Never  Vaping Use   Vaping status: Never Used  Substance and Sexual Activity   Alcohol use:  Yes    Comment: weekly   Drug use: No   Sexual activity: Not on file  Other Topics Concern   Not on file  Social History Narrative   Not on file   Social Determinants of Health   Financial Resource Strain: Low Risk  (05/01/2022)   Overall Financial Resource Strain (CARDIA)    Difficulty of Paying Living Expenses: Not hard at all  Food Insecurity: No Food Insecurity (05/01/2022)    Hunger Vital Sign    Worried About Running Out of Food in the Last Year: Never true    Ran Out of Food in the Last Year: Never true  Transportation Needs: No Transportation Needs (05/01/2022)   PRAPARE - Administrator, Civil Service (Medical): No    Lack of Transportation (Non-Medical): No  Physical Activity: Insufficiently Active (05/01/2022)   Exercise Vital Sign    Days of Exercise per Week: 2 days    Minutes of Exercise per Session: 20 min  Stress: No Stress Concern Present (05/01/2022)   [employer] of Occupational Health - Occupational Stress Questionnaire    Feeling of Stress : Not at all  Social Connections: Moderately Isolated (05/01/2022)   Social Connection and Isolation Panel [NHANES]    Frequency of Communication with Friends and Family: More than three times a week    Frequency of Social Gatherings with Friends and Family: Once a week    Attends Religious Services: More than 4 times per year    Active Member of [employer] or Organizations: No    Attends Banker Meetings: Never    Marital Status: Divorced  Catering manager Violence: Not At Risk (05/01/2022)   Humiliation, Afraid, Rape, and Kick questionnaire    Fear of Current or Ex-Partner: No    Emotionally Abused: No    Physically Abused: No    Sexually Abused: No    Past Surgical History:  Procedure Laterality Date   KNEE SURGERY     LEFT HEART CATH AND CORONARY ANGIOGRAPHY N/A 02/28/2017   Procedure: LEFT HEART CATH AND CORONARY ANGIOGRAPHY;  Surgeon: Kathleene Hazel, MD;  Location: [facility];  Service: Cardiovascular;  Laterality: N/A;    Family History  Problem Relation Age of Onset   Hypertension Father    Hypertension Mother     No Known Allergies  Current Outpatient Medications on File Prior to Visit  Medication Sig Dispense Refill   ALPRAZolam (XANAX) 0.5 MG tablet Take 0.25-0.5 mg by mouth 3 (three) times daily as needed for anxiety.     apixaban (ELIQUIS) 5  MG TABS tablet Take 1 tablet (5 mg total) by mouth 2 (two) times daily. 180 tablet 1   atorvastatin (LIPITOR) 80 MG tablet TAKE 1 TABLET(80 MG) BY MOUTH DAILY 90 tablet 0   cloNIDine (CATAPRES) 0.1 MG tablet Take 1 tablet (0.1 mg total) by mouth daily. 90 tablet 2   diltiazem (CARDIZEM CD) 360 MG 24 hr capsule Take 1 capsule (360 mg total) by mouth daily. 90 capsule 3   esomeprazole (NEXIUM) 20 MG capsule Take 20 mg by mouth daily at 12 noon.     ezetimibe (ZETIA) 10 MG tablet Take 1 tablet (10 mg total) by mouth daily. 90 tablet 1   flecainide (TAMBOCOR) 50 MG tablet Take 1 tablet (50 mg total) by mouth 2 (two) times daily. 180 tablet 2   glipiZIDE (GLUCOTROL) 10 MG tablet TAKE 1 TABLET BY MOUTH TWICE DAILY 180 tablet 0   glucose blood (  ACCU-CHEK AVIVA PLUS) test strip Check blood sugar twice a day.  Dx: E11.9 (Patient taking differently: 1 each by Other route as needed for other (see below). Check blood sugar twice a day.  Dx: E11.9) 200 each 1   guaiFENesin (MUCINEX) 600 MG 12 hr tablet Take 600 mg by mouth daily as needed for to loosen phlegm or cough.     hydrOXYzine (ATARAX/VISTARIL) 10 MG tablet Take 10 mg by mouth in the morning and at bedtime.     insulin glargine (LANTUS SOLOSTAR) 100 UNIT/ML Solostar Pen INJECT 30 UNITS UNDER SKIN EVERY MORNING AND 50 UNITS EVERY EVENING (Patient taking differently: Inject 30-50 Units into the skin See admin instructions. 30 units am and 50 units pm) 60 mL 0   losartan (COZAAR) 50 MG tablet TAKE 1 TABLET(50 MG) BY MOUTH DAILY (Patient taking differently: Take 50 mg by mouth daily.) 90 tablet 3   metFORMIN (GLUCOPHAGE) 1000 MG tablet Take 1 tablet (1,000 mg total) by mouth 2 (two) times daily with a meal. 180 tablet 0   metoprolol (TOPROL-XL) 200 MG 24 hr tablet TAKE 1 TABLET(200 MG) BY MOUTH DAILY 90 tablet 1   OVER THE COUNTER MEDICATION Take 1 tablet by mouth daily. Beta Prostate     potassium chloride (KLOR-CON) 10 MEQ tablet Take 1 tablet (10 mEq  total) by mouth daily. 90 tablet 2   tamsulosin (FLOMAX) 0.4 MG CAPS capsule Take 1 capsule (0.4 mg total) by mouth daily after supper. 90 capsule 3   torsemide (DEMADEX) 20 MG tablet Take 1 tablet (20 mg total) by mouth daily. 90 tablet 2   finasteride (PROSCAR) 5 MG tablet Take 5 mg by mouth daily.     nitroGLYCERIN (NITROSTAT) 0.4 MG SL tablet Place 1 tablet (0.4 mg total) under the tongue every 5 (five) minutes as needed for chest pain. 25 tablet 2   No current facility-administered medications on file prior to visit.    BP 122/62   Pulse 88   Ht 5\' 7"  (1.702 m)   Wt 247 lb (112 kg)   SpO2 96%   BMI 38.69 kg/m        Objective:   Physical Exam  General Mental Status- Alert. General Appearance- Not in acute distress.   Skin General: Color- Normal Color. Moisture- Normal Moisture.  Neck Thyromegaly also mild soreness to rt side thyroid area. Skn no warm, indurated or fluctant.  Chest and Lung Exam Auscultation: Breath Sounds:-Normal.  Cardiovascular Auscultation:Rythm- Regular. Murmurs & Other Heart Sounds:Auscultation of the heart reveals- No Murmurs.  Abdomen Inspection:-Inspeection Normal. Palpation/Percussion:Note:No mass. Palpation and Percussion of the abdomen reveal- Non Tender, Non Distended + BS, no rebound or guarding.  Neurologic Cranial Nerve exam:- CN III-XII intact(No nystagmus), symmetric smile. Strength:- 5/5 equal and symmetric strength both upper and lower extremities.       Assessment & Plan:  1. Benign prostatic hyperplasia with urinary frequency Continue meds prescribed by urologist.  2. Type 2 diabetes mellitus with complication, without long-term current use of insulin (HCC) - metformin 1000 mg twice daily and on glipizide 10 mg daily. Continue lantus as well -cmp -A1c today.  3. Hypertension, unspecified type -bp well controlled today. On losartan 50 mg daily.  4. Paroxysmal atrial fibrillation (HCC) -rate controlled today  flecainide, cardizem  and toprol xl. Continue eliquis  5. Coronary artery disease involving native coronary artery of native heart without angina pectoris. -no angina reported.  6. Insomnia- rx trazadone 50 mg at night.   Follow  up date to be determined after lab review  Esperanza Richters, PA-C

## 2022-12-08 NOTE — Addendum Note (Signed)
Addended by: Gwenevere Abbot on: 12/08/2022 10:11 PM   Modules accepted: Orders

## 2022-12-10 ENCOUNTER — Ambulatory Visit (HOSPITAL_BASED_OUTPATIENT_CLINIC_OR_DEPARTMENT_OTHER)
Admission: RE | Admit: 2022-12-10 | Discharge: 2022-12-10 | Disposition: A | Discharge status: Home / Self Care | Payer: Medicare Other | Source: Ambulatory Visit | Attending: Medical | Admitting: Medical

## 2022-12-10 DIAGNOSIS — E010 Iodine-deficiency related diffuse (endemic) goiter: Secondary | ICD-10-CM | POA: Diagnosis not present

## 2022-12-10 DIAGNOSIS — E042 Nontoxic multinodular goiter: Secondary | ICD-10-CM | POA: Diagnosis not present

## 2022-12-12 NOTE — Addendum Note (Signed)
Addended by: Gwenevere Abbot on: 12/12/2022 06:05 PM   Modules accepted: Orders

## 2022-12-17 DIAGNOSIS — Z794 Long term (current) use of insulin: Secondary | ICD-10-CM | POA: Diagnosis not present

## 2022-12-17 DIAGNOSIS — H11153 Pinguecula, bilateral: Secondary | ICD-10-CM | POA: Diagnosis not present

## 2022-12-17 DIAGNOSIS — H2513 Age-related nuclear cataract, bilateral: Secondary | ICD-10-CM | POA: Diagnosis not present

## 2022-12-17 DIAGNOSIS — H43811 Vitreous degeneration, right eye: Secondary | ICD-10-CM | POA: Diagnosis not present

## 2022-12-17 DIAGNOSIS — H35363 Drusen (degenerative) of macula, bilateral: Secondary | ICD-10-CM | POA: Diagnosis not present

## 2022-12-17 DIAGNOSIS — H524 Presbyopia: Secondary | ICD-10-CM | POA: Diagnosis not present

## 2022-12-17 DIAGNOSIS — H25812 Combined forms of age-related cataract, left eye: Secondary | ICD-10-CM | POA: Diagnosis not present

## 2022-12-17 DIAGNOSIS — E119 Type 2 diabetes mellitus without complications: Secondary | ICD-10-CM | POA: Diagnosis not present

## 2022-12-17 DIAGNOSIS — H04123 Dry eye syndrome of bilateral lacrimal glands: Secondary | ICD-10-CM | POA: Diagnosis not present

## 2022-12-17 DIAGNOSIS — H52203 Unspecified astigmatism, bilateral: Secondary | ICD-10-CM | POA: Diagnosis not present

## 2022-12-17 DIAGNOSIS — H25013 Cortical age-related cataract, bilateral: Secondary | ICD-10-CM | POA: Diagnosis not present

## 2022-12-18 DIAGNOSIS — R3915 Urgency of urination: Secondary | ICD-10-CM | POA: Diagnosis not present

## 2022-12-18 DIAGNOSIS — R3912 Poor urinary stream: Secondary | ICD-10-CM | POA: Diagnosis not present

## 2022-12-18 DIAGNOSIS — R350 Frequency of micturition: Secondary | ICD-10-CM | POA: Diagnosis not present

## 2022-12-19 ENCOUNTER — Telehealth: Payer: Self-pay | Admitting: Medical

## 2022-12-19 NOTE — Telephone Encounter (Signed)
Pt wants to know the results of his thyroid ultrasound. Please advise.

## 2022-12-20 NOTE — Telephone Encounter (Signed)
Once provider has reviewed them , pt will get a call back

## 2022-12-25 ENCOUNTER — Other Ambulatory Visit: Payer: Self-pay | Admitting: Medical

## 2023-01-01 DIAGNOSIS — H25812 Combined forms of age-related cataract, left eye: Secondary | ICD-10-CM | POA: Diagnosis not present

## 2023-01-01 DIAGNOSIS — H2512 Age-related nuclear cataract, left eye: Secondary | ICD-10-CM | POA: Diagnosis not present

## 2023-01-03 ENCOUNTER — Encounter: Payer: Self-pay | Admitting: Medical

## 2023-01-03 NOTE — Telephone Encounter (Signed)
Pt called to go over results. After reviewing chart, noted that no result notes were present with imaging. Please Advise.

## 2023-01-04 ENCOUNTER — Other Ambulatory Visit: Payer: Self-pay

## 2023-01-04 DIAGNOSIS — E118 Type 2 diabetes mellitus with unspecified complications: Secondary | ICD-10-CM

## 2023-01-04 MED ORDER — PIP PEN NEEDLES 32G X 4MM 32G X 4 MM MISC: 1.0000 | 5 refills | Status: DC

## 2023-01-04 NOTE — Telephone Encounter (Signed)
Pt notified , pt stated he has an appt next month with ENT but hasn't received a call from endo , gave pt endo's number to schedule an appt

## 2023-01-16 DIAGNOSIS — R350 Frequency of micturition: Secondary | ICD-10-CM | POA: Diagnosis not present

## 2023-01-16 DIAGNOSIS — R3915 Urgency of urination: Secondary | ICD-10-CM | POA: Diagnosis not present

## 2023-01-28 DIAGNOSIS — E041 Nontoxic single thyroid nodule: Secondary | ICD-10-CM | POA: Diagnosis not present

## 2023-02-01 ENCOUNTER — Other Ambulatory Visit: Payer: Self-pay | Admitting: Medical

## 2023-02-04 DIAGNOSIS — H25811 Combined forms of age-related cataract, right eye: Secondary | ICD-10-CM | POA: Diagnosis not present

## 2023-02-13 DIAGNOSIS — H2511 Age-related nuclear cataract, right eye: Secondary | ICD-10-CM | POA: Diagnosis not present

## 2023-02-13 DIAGNOSIS — H25811 Combined forms of age-related cataract, right eye: Secondary | ICD-10-CM | POA: Diagnosis not present

## 2023-02-18 DIAGNOSIS — E041 Nontoxic single thyroid nodule: Secondary | ICD-10-CM | POA: Diagnosis not present

## 2023-02-19 DIAGNOSIS — E041 Nontoxic single thyroid nodule: Secondary | ICD-10-CM | POA: Diagnosis not present

## 2023-03-11 ENCOUNTER — Encounter: Payer: Self-pay | Admitting: Medical

## 2023-03-11 ENCOUNTER — Telehealth: Payer: Self-pay | Admitting: Medical

## 2023-03-11 NOTE — Telephone Encounter (Signed)
Results pending per care everywhere as of 03/04/23

## 2023-03-11 NOTE — Telephone Encounter (Signed)
Results pending per care everywhere

## 2023-03-11 NOTE — Telephone Encounter (Signed)
Will you call this office and ask them to send [place] the results of pt thyroid nodule biopsy. Pt states he has been waiting for the results. Are the biopsy results back?  I reviewed his thyroid results back. I sent him copy of [place] results today. He states he can't get into my cahrt.  [facility] - Otolaryngology [facility]  [address]  [address]  [place], [place] [address]  [phone]

## 2023-03-11 NOTE — Telephone Encounter (Signed)
Pt called to advise he never received his Thyroid US results from back in August. Pt is concerned. He said he also called the [facility] provider but they were not able to provide him the results. Please review and give a patient call to advise.

## 2023-03-13 ENCOUNTER — Other Ambulatory Visit (HOSPITAL_BASED_OUTPATIENT_CLINIC_OR_DEPARTMENT_OTHER): Payer: Self-pay

## 2023-03-13 MED ORDER — COVID-19 MRNA VAC-TRIS(PFIZER) 30 MCG/0.3ML IM SUSY
0.3000 mL | PREFILLED_SYRINGE | Freq: Once | INTRAMUSCULAR | 0 refills | Status: AC
  Filled 2023-03-13: qty 0.3, 1d supply, fill #0

## 2023-03-13 MED ORDER — INFLUENZA VAC A&B SURF ANT ADJ 0.5 ML IM SUSY
0.5000 mL | PREFILLED_SYRINGE | Freq: Once | INTRAMUSCULAR | 0 refills | Status: AC
  Filled 2023-03-13: qty 0.5, 1d supply, fill #0

## 2023-03-18 ENCOUNTER — Other Ambulatory Visit: Payer: Self-pay | Admitting: Medical

## 2023-03-18 DIAGNOSIS — H524 Presbyopia: Secondary | ICD-10-CM | POA: Diagnosis not present

## 2023-03-18 LAB — HM DIABETES EYE EXAM

## 2023-03-24 ENCOUNTER — Other Ambulatory Visit: Payer: Self-pay | Admitting: Medical

## 2023-04-05 ENCOUNTER — Encounter: Payer: Self-pay | Admitting: Medical

## 2023-04-15 ENCOUNTER — Other Ambulatory Visit: Payer: Self-pay | Admitting: Medical

## 2023-04-18 ENCOUNTER — Other Ambulatory Visit: Payer: Self-pay | Admitting: Cardiology

## 2023-04-18 ENCOUNTER — Telehealth: Payer: Self-pay | Admitting: Medical

## 2023-04-18 NOTE — Telephone Encounter (Signed)
Copied from CRM [id number]. Topic: Medicare AWV >> Apr 18, 2023  2:50 PM Payton Doughty wrote: Reason for CRM: Called LVM 04/18/2023 to schedule AWV. Please schedule office or virtual visits.  Verlee Rossetti; Care Guide Ambulatory Clinical Support [facility] Direct Dial: [phone]

## 2023-04-19 ENCOUNTER — Other Ambulatory Visit: Payer: Self-pay | Admitting: Cardiology

## 2023-04-19 NOTE — Telephone Encounter (Signed)
Prescription sent to pharmacy.

## 2023-04-22 ENCOUNTER — Other Ambulatory Visit: Payer: Self-pay | Admitting: Cardiology

## 2023-04-30 ENCOUNTER — Other Ambulatory Visit: Payer: Self-pay | Admitting: Medical

## 2023-05-09 ENCOUNTER — Ambulatory Visit: Payer: Medicare Other

## 2023-05-09 VITALS — Ht 67.0 in | Wt 247.0 lb

## 2023-05-09 DIAGNOSIS — Z0000 Encounter for general adult medical examination without abnormal findings: Secondary | ICD-10-CM

## 2023-05-09 DIAGNOSIS — Z1211 Encounter for screening for malignant neoplasm of colon: Secondary | ICD-10-CM | POA: Diagnosis not present

## 2023-05-09 NOTE — Progress Notes (Signed)
Subjective:   Tyler Cancel Sr. is a 73 y.o. male who presents for Medicare Annual/Subsequent preventive examination.  Visit Complete: Virtual I connected with  Tyler Cancel Sr. on 05/09/23 by a audio enabled telemedicine application and verified that I am speaking with the correct person using two identifiers.  Patient Location: Home  Provider Location: Home Office  I discussed the limitations of evaluation and management by telemedicine. The patient expressed understanding and agreed to proceed.  Vital Signs: Because this visit was a virtual/telehealth visit, some criteria may be missing or patient reported. Any vitals not documented were not able to be obtained and vitals that have been documented are patient reported.    Cardiac Risk Factors include: advanced age (>23men, >5 women);male gender;diabetes mellitus;hypertension     Objective:    Today's Vitals   05/09/23 1137  Weight: 247 lb (112 kg)  Height: 5\' 7"  (1.702 m)   Body mass index is 38.69 kg/m.     05/09/2023   11:49 AM 05/01/2022   11:20 AM 09/25/2021    2:33 PM 12/13/2018   11:12 AM 02/12/2018    7:48 PM 02/27/2017   11:24 AM  Advanced Directives  Does Patient Have a Medical Advance Directive? No No No No No No  Does patient want to make changes to medical advance directive?  No - Patient declined      Would patient like information on creating a medical advance directive? No - Patient declined Yes (MAU/Ambulatory/Procedural Areas - Information given) No - Patient declined       Current Medications (verified) Outpatient Encounter Medications as of 05/09/2023  Medication Sig   ALPRAZolam (XANAX) 0.5 MG tablet Take 0.25-0.5 mg by mouth 3 (three) times daily as needed for anxiety.   apixaban (ELIQUIS) 5 MG TABS tablet Take 1 tablet (5 mg total) by mouth 2 (two) times daily.   atorvastatin (LIPITOR) 80 MG tablet Take 1 tablet (80 mg total) by mouth daily.   cloNIDine (CATAPRES) 0.1 MG tablet Take 1 tablet  (0.1 mg total) by mouth daily.   diltiazem (CARDIZEM CD) 360 MG 24 hr capsule Take 1 capsule (360 mg total) by mouth daily.   esomeprazole (NEXIUM) 20 MG capsule Take 20 mg by mouth daily at 12 noon.   ezetimibe (ZETIA) 10 MG tablet TAKE 1 TABLET(10 MG) BY MOUTH DAILY   finasteride (PROSCAR) 5 MG tablet Take 5 mg by mouth daily.   flecainide (TAMBOCOR) 50 MG tablet TAKE 1 TABLET(50 MG) BY MOUTH TWICE DAILY   glipiZIDE (GLUCOTROL) 10 MG tablet Take 1 tablet (10 mg total) by mouth 2 (two) times daily.   glucose blood (ACCU-CHEK AVIVA PLUS) test strip Check blood sugar twice a day.  Dx: E11.9 (Patient taking differently: 1 each by Other route as needed for other (see below). Check blood sugar twice a day.  Dx: E11.9)   guaiFENesin (MUCINEX) 600 MG 12 hr tablet Take 600 mg by mouth daily as needed for to loosen phlegm or cough.   hydrOXYzine (ATARAX/VISTARIL) 10 MG tablet Take 10 mg by mouth in the morning and at bedtime.   Insulin Pen Needle (PIP PEN NEEDLES 32G X ) 32G X 4 MM MISC 1 each by Does not apply route as directed. Dx Code: E11.8   LANTUS SOLOSTAR 100 UNIT/ML Solostar Pen INJECT 30 UNITS UNDER THE SKIN EVERY MORNING AND 50 UNITS EVERY EVENING   losartan (COZAAR) 50 MG tablet TAKE 1 TABLET(50 MG) BY MOUTH DAILY   metFORMIN (GLUCOPHAGE)  1000 MG tablet TAKE 1 TABLET(1000 MG) BY MOUTH TWICE DAILY WITH A MEAL   metoprolol (TOPROL-XL) 200 MG 24 hr tablet TAKE 1 TABLET(200 MG) BY MOUTH DAILY   nitroGLYCERIN (NITROSTAT) 0.4 MG SL tablet Place 1 tablet (0.4 mg total) under the tongue every 5 (five) minutes as needed for chest pain.   OVER THE COUNTER MEDICATION Take 1 tablet by mouth daily. Beta Prostate   potassium chloride (KLOR-CON) 10 MEQ tablet Take 1 tablet (10 mEq total) by mouth daily.   tamsulosin (FLOMAX) 0.4 MG CAPS capsule Take 1 capsule (0.4 mg total) by mouth daily after supper.   torsemide (DEMADEX) 20 MG tablet Take 1 tablet (20 mg total) by mouth daily.   traZODone (DESYREL) 50  MG tablet Take 0.5-1 tablets (25-50 mg total) by mouth at bedtime as needed for sleep.   No facility-administered encounter medications on file as of 05/09/2023.    Allergies (verified) Patient has no allergy information on record.   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   Enlarged prostate with urinary obstruction 05/11/2020   Essential hypertension 03/11/2017   Hypertension    Hyperthyroidism 06/26/2017   Medication management 06/06/2020   Multinodular goiter 07/03/2017   Nocturia 02/02/2014   NSTEMI (non-ST elevated myocardial infarction) (HCC) 02/27/2017   Palpitations 10/28/2019   Paroxysmal atrial fibrillation (HCC) 05/24/2020   Supraventricular tachycardia (HCC) 02/14/2018   Type 2 diabetes mellitus with complication, without long-term current use of insulin (HCC) 03/11/2017   Past Surgical History:  Procedure Laterality Date   KNEE SURGERY     LEFT HEART CATH AND CORONARY ANGIOGRAPHY N/A 02/28/2017   Procedure: LEFT HEART CATH AND CORONARY ANGIOGRAPHY;  Surgeon: Kathleene Hazel, MD;  Location: [facility];  Service: Cardiovascular;  Laterality: N/A;   Family History  Problem Relation Age of Onset   Hypertension Father    Hypertension Mother    Social History   Socioeconomic History   Marital status: Divorced    Spouse name: Not on file   Number of children: Not on file   Years of education: Not on file   Highest education level: Not on file  Occupational History   Not on file  Tobacco Use   Smoking status: Never   Smokeless tobacco: Never  Vaping Use   Vaping status: Never Used  Substance and Sexual Activity   Alcohol use: Yes    Comment: weekly   Drug use: No   Sexual activity: Not on file  Other Topics Concern    Not on file  Social History Narrative   Not on file   Social Drivers of Health   Financial Resource Strain: Low Risk  (05/09/2023)   Overall Financial Resource Strain (CARDIA)    Difficulty of Paying Living Expenses: Not hard at all  Food Insecurity: No Food Insecurity (05/09/2023)   Hunger Vital Sign    Worried About Running Out of Food in the Last Year: Never true    Ran Out of Food in the Last Year: Never true  Transportation Needs: No Transportation Needs (05/09/2023)   PRAPARE - Administrator, Civil Service (Medical): No    Lack of Transportation (Non-Medical): No  Physical Activity: Insufficiently Active (05/09/2023)   Exercise Vital Sign  Days of Exercise per Week: 2 days    Minutes of Exercise per Session: 20 min  Stress: No Stress Concern Present (05/09/2023)   [employer] of Occupational Health - Occupational Stress Questionnaire    Feeling of Stress : Not at all  Social Connections: Moderately Integrated (05/09/2023)   Social Connection and Isolation Panel [NHANES]    Frequency of Communication with Friends and Family: More than three times a week    Frequency of Social Gatherings with Friends and Family: More than three times a week    Attends Religious Services: More than 4 times per year    Active Member of [employer] or Organizations: Yes    Attends Engineer, structural: More than 4 times per year    Marital Status: Divorced    Tobacco Counseling Counseling given: Not Answered   Clinical Intake:  Pre-visit preparation completed: Yes  Pain : No/denies pain     BMI - recorded: 38.69 Nutritional Status: BMI > 30  Obese Nutritional Risks: None Diabetes: Yes CBG done?: No Did pt. bring in CBG monitor from home?: No  How often do you need to have someone help you when you read instructions, pamphlets, or other written materials from your doctor or pharmacy?: 1 - Never  Interpreter Needed?: No  Information entered by :: Theresa Mulligan  LPN   Activities of Daily Living    05/09/2023   11:48 AM  In your present state of health, do you have any difficulty performing the following activities:  Hearing? 0  Vision? 0  Difficulty concentrating or making decisions? 0  Walking or climbing stairs? 1  Comment Uses a Cane  Dressing or bathing? 0  Doing errands, shopping? 0  Preparing Food and eating ? N  Using the Toilet? N  In the past six months, have you accidently leaked urine? N  Do you have problems with loss of bowel control? N  Managing your Medications? N  Managing your Finances? N  Housekeeping or managing your Housekeeping? N    Patient Care Team: Saguier, Kateri Mc as PCP - General (Internal Medicine) Georgeanna Lea, MD as PCP - Cardiology (Cardiology) Regan Lemming, MD as PCP - Electrophysiology (Cardiology)  Indicate any recent Medical Services you may have received from other than [facility] providers in the past year (date may be approximate).     Assessment:   This is a routine wellness examination for Tyler Ellis.  Hearing/Vision screen Hearing Screening - Comments:: Denies hearing difficulties   Vision Screening - Comments:: Wears rx glasses - up to date with routine eye exams with  Deferred   Goals Addressed               This Visit's Progress     Increase physical activity (pt-stated)        Stay Active       Depression Screen    05/09/2023   11:42 AM 09/10/2022    4:43 PM 05/01/2022   11:26 AM 04/26/2022    3:53 PM 11/08/2021    9:06 AM 09/01/2021    9:37 AM 08/01/2021    2:04 PM  PHQ 2/9 Scores  PHQ - 2 Score 0 0 0 0 0 0 0    Fall Risk    05/09/2023   11:48 AM 08/08/2022    1:03 PM 05/01/2022   11:22 AM 11/08/2021    9:06 AM 08/20/2021    9:08 AM  Fall Risk   Falls in the past year? 0 0  0 0 0  Number falls in past yr: 0 0 0 0 0  Injury with Fall? 0 0 0 0 0  Risk for fall due to : No Fall Risks No Fall Risks No Fall Risks  No Fall Risks  Follow up Falls prevention  discussed Falls evaluation completed;Education provided Falls evaluation completed  Falls evaluation completed    MEDICARE RISK AT HOME: Medicare Risk at Home Any stairs in or around the home?: No If so, are there any without handrails?: No Home free of loose throw rugs in walkways, pet beds, electrical cords, etc?: Yes Adequate lighting in your home to reduce risk of falls?: Yes Life alert?: No Use of a cane, walker or w/c?: Yes Grab bars in the bathroom?: Yes Shower chair or bench in shower?: Yes Elevated toilet seat or a handicapped toilet?: Yes  TIMED UP AND GO:  Was the test performed?  No    Cognitive Function:    05/01/2022   11:43 AM  MMSE - Mini Mental State Exam  Not completed: Unable to complete        05/09/2023   11:49 AM  6CIT Screen  What Year? 0 points  What month? 0 points  What time? 0 points  Count back from 20 0 points  Months in reverse 0 points  Repeat phrase 0 points  Total Score 0 points    Immunizations Immunization History  Administered Date(s) Administered   Fluad Quad(high Dose 65+) 01/25/2021, 04/25/2022   Fluad Trivalent(High Dose 65+) 03/13/2023   Influenza, High Dose Seasonal PF 12/26/2018   Janssen (J&J) SARS-COV-2 Vaccination 07/05/2019   Pfizer Covid-19 Vaccine Bivalent Booster 74yrs & up 01/25/2021   Pfizer(Comirnaty)Fall Seasonal Vaccine 12 years and older 03/13/2023    TDAP status: Due, Education has been provided regarding the importance of this vaccine. Advised may receive this vaccine at local pharmacy or Health Dept. Aware to provide a copy of the vaccination record if obtained from local pharmacy or Health Dept. Verbalized acceptance and understanding.  Flu Vaccine status: Up to date  Pneumococcal vaccine status: Due, Education has been provided regarding the importance of this vaccine. Advised may receive this vaccine at local pharmacy or Health Dept. Aware to provide a copy of the vaccination record if obtained from  local pharmacy or Health Dept. Verbalized acceptance and understanding.  Covid-19 vaccine status: Declined, Education has been provided regarding the importance of this vaccine but patient still declined. Advised may receive this vaccine at local pharmacy or Health Dept.or vaccine clinic. Aware to provide a copy of the vaccination record if obtained from local pharmacy or Health Dept. Verbalized acceptance and understanding.  Qualifies for Shingles Vaccine? Yes   Zostavax completed No   Shingrix Completed?: No.    Education has been provided regarding the importance of this vaccine. Patient has been advised to call insurance company to determine out of pocket expense if they have not yet received this vaccine. Advised may also receive vaccine at local pharmacy or Health Dept. Verbalized acceptance and understanding.  Screening Tests Health Maintenance  Topic Date Due   Pneumonia Vaccine 35+ Years old (1 of 2 - PCV) Never done   FOOT EXAM  Never done   Diabetic kidney evaluation - Urine ACR  Never done   DTaP/Tdap/Td (1 - Tdap) Never done   Zoster Vaccines- Shingrix (1 of 2) Never done   Colonoscopy  Never done   COVID-19 Vaccine (4 - 2024-25 season) 05/08/2023   HEMOGLOBIN A1C  06/08/2023  Diabetic kidney evaluation - eGFR measurement  12/06/2023   OPHTHALMOLOGY EXAM  03/17/2024   Medicare Annual Wellness (AWV)  05/08/2024   INFLUENZA VACCINE  Completed   Hepatitis C Screening  Completed   HPV VACCINES  Aged Out    Health Maintenance  Health Maintenance Due  Topic Date Due   Pneumonia Vaccine 20+ Years old (1 of 2 - PCV) Never done   FOOT EXAM  Never done   Diabetic kidney evaluation - Urine ACR  Never done   DTaP/Tdap/Td (1 - Tdap) Never done   Zoster Vaccines- Shingrix (1 of 2) Never done   Colonoscopy  Never done   COVID-19 Vaccine (4 - 2024-25 season) 05/08/2023    Colorectal cancer screening: Referral to GI placed 05/09/23. Pt aware the office will call re:  appt.     Additional Screening:  Hepatitis C Screening: does qualify; Completed 08/21/21  Vision Screening: Recommended annual ophthalmology exams for early detection of glaucoma and other disorders of the eye. Is the patient up to date with their annual eye exam?  Yes  Who is the provider or what is the name of the office in which the patient attends annual eye exams? Deferred If pt is not established with a provider, would they like to be referred to a provider to establish care? No .   Dental Screening: Recommended annual dental exams for proper oral hygiene  Diabetic Foot Exam: Diabetic Foot Exam: Overdue, Pt has been advised about the importance in completing this exam. Pt is scheduled for diabetic foot exam on Deferred.  Community Resource Referral / Chronic Care Management:  CRR required this visit?  No   CCM required this visit?  No     Plan:     I have personally reviewed and noted the following in the patient's chart:   Medical and social history Use of alcohol, tobacco or illicit drugs  Current medications and supplements including opioid prescriptions. Patient is not currently taking opioid prescriptions. Functional ability and status Nutritional status Physical activity Advanced directives List of other physicians Hospitalizations, surgeries, and ER visits in previous 12 months Vitals Screenings to include cognitive, depression, and falls Referrals and appointments  In addition, I have reviewed and discussed with patient certain preventive protocols, quality metrics, and best practice recommendations. A written personalized care plan for preventive services as well as general preventive health recommendations were provided to patient.     Tillie Rung, LPN   08/06/6061   After Visit Summary: (MyChart) Due to this being a telephonic visit, the after visit summary with patients personalized plan was offered to patient via MyChart   Nurse Notes:  None

## 2023-05-09 NOTE — Patient Instructions (Addendum)
Mr. Mendiola , Thank you for taking time to come for your Medicare Wellness Visit. I appreciate your ongoing commitment to your health goals. Please review the following plan we discussed and let me know if I can assist you in the future.   Referrals/Orders/Follow-Ups/Clinician Recommendations:   This is a list of the screening recommended for you and due dates:  Health Maintenance  Topic Date Due   Pneumonia Vaccine (1 of 2 - PCV) Never done   Complete foot exam   Never done   Yearly kidney health urinalysis for diabetes  Never done   DTaP/Tdap/Td vaccine (1 - Tdap) Never done   Zoster (Shingles) Vaccine (1 of 2) Never done   Colon Cancer Screening  Never done   COVID-19 Vaccine (4 - 2024-25 season) 05/08/2023   Hemoglobin A1C  06/08/2023   Yearly kidney function blood test for diabetes  12/06/2023   Eye exam for diabetics  03/17/2024   Medicare Annual Wellness Visit  05/08/2024   Flu Shot  Completed   Hepatitis C Screening  Completed   HPV Vaccine  Aged Out    Advanced directives: (Declined) Advance directive discussed with you today. Even though you declined this today, please call our office should you change your mind, and we can give you the proper paperwork for you to fill out.  Next Medicare Annual Wellness Visit scheduled for next year: Yes

## 2023-05-10 ENCOUNTER — Other Ambulatory Visit: Payer: Self-pay | Admitting: Medical

## 2023-05-10 MED ORDER — METOPROLOL SUCCINATE ER 200 MG PO TB24: 200.0000 mg | ORAL_TABLET | Freq: Every day | ORAL | 0 refills | Status: DC

## 2023-05-10 NOTE — Telephone Encounter (Signed)
Copied from CRM [id number]. Topic: Clinical - Medication Refill >> May 10, 2023  4:50 PM Tiffany H wrote: Most Recent Primary Care Visit:  Provider: Esperanza Richters  Department: [facility]  Visit Type: OFFICE VISIT  Date: 12/06/2022  Medication: metoprolol (TOPROL-XL) 200 MG 24 hr tablet   Has the patient contacted their pharmacy? Yes (Agent: If no, request that the patient contact the pharmacy for the refill. If patient does not wish to contact the pharmacy document the reason why and proceed with request.) (Agent: If yes, when and what did the pharmacy advise?)  Is this the correct pharmacy for this prescription? Yes If no, delete pharmacy and type the correct one.  This is the patient's preferred pharmacy:  [id number] - [place], [place] - [address] AT [facility] [address] [place] [address] Phone: [phone] Fax: [phone]  Has the prescription been filled recently? No.   Is the patient out of the medication? Yes  Has the patient been seen for an appointment in the last year OR does the patient have an upcoming appointment? Yes. Patient was late for his 3 month follow up. Scheduled patient for next available on 05/13/23.   Can we respond through MyChart? No  Agent: Please be advised that Rx refills may take up to 3 business days. We ask that you follow-up with your pharmacy.

## 2023-05-13 ENCOUNTER — Ambulatory Visit (INDEPENDENT_AMBULATORY_CARE_PROVIDER_SITE_OTHER): Payer: Medicare Other | Admitting: Medical

## 2023-05-13 ENCOUNTER — Encounter: Payer: Self-pay | Admitting: Medical

## 2023-05-13 ENCOUNTER — Ambulatory Visit (HOSPITAL_BASED_OUTPATIENT_CLINIC_OR_DEPARTMENT_OTHER)
Admission: RE | Admit: 2023-05-13 | Discharge: 2023-05-13 | Disposition: A | Discharge status: Home / Self Care | Payer: Medicare Other | Source: Ambulatory Visit | Attending: Medical | Admitting: Medical

## 2023-05-13 VITALS — BP 170/80 | HR 96 | Resp 18 | Ht 67.0 in | Wt 255.4 lb

## 2023-05-13 DIAGNOSIS — R059 Cough, unspecified: Secondary | ICD-10-CM | POA: Diagnosis not present

## 2023-05-13 DIAGNOSIS — R042 Hemoptysis: Secondary | ICD-10-CM | POA: Diagnosis not present

## 2023-05-13 DIAGNOSIS — G4700 Insomnia, unspecified: Secondary | ICD-10-CM

## 2023-05-13 DIAGNOSIS — I10 Essential (primary) hypertension: Secondary | ICD-10-CM

## 2023-05-13 DIAGNOSIS — E041 Nontoxic single thyroid nodule: Secondary | ICD-10-CM | POA: Diagnosis not present

## 2023-05-13 DIAGNOSIS — I771 Stricture of artery: Secondary | ICD-10-CM | POA: Diagnosis not present

## 2023-05-13 MED ORDER — METOPROLOL SUCCINATE ER 200 MG PO TB24: 200.0000 mg | ORAL_TABLET | Freq: Every day | ORAL | 3 refills | Status: DC

## 2023-05-13 NOTE — Patient Instructions (Signed)
Hemoptysis Occasional specks of blood in sputum over the past two months, last episode one month ago. No current sinus infection symptoms. Light history of smoking in the past. -Order chest x-ray to investigate cause of hemoptysis.  Thyroid nodules Patient reports discomfort when bending neck. Previous biopsy reportedly benign, but pathology report not reviewed. Second opinion scheduled with endocrinologist in February. -Obtain copy of previous biopsy pathology report for review. -Keep scheduled appointment with endocrinologist for second opinion.   Htn- bp is high today but has not been on metoprolol since saturday. In the past when ran out of metoprolol bp did spike. No cardiac or neurologic signs/symptom.  - Refilled metroprolol today. On losartan, clonidine and cardizem - continue all other cardiac meds as well.   For insomnia stop trazadone and try melatonin 3 mg at night.   Follow up this Friday for bp check or sooner if needed. If any cardiac or neurologic signs or symptoms be seen in ED.

## 2023-05-13 NOTE — Progress Notes (Signed)
Subjective:    Patient ID: Tyler Rim., male    DOB: 07-16-1950, 73 y.o.   MRN: [id number]  HPI  Discussed the use of AI scribe software for clinical note transcription with the patient, who gave verbal consent to proceed.  History of Present Illness   The patient, with a history of thyroid nodules, presents with intermittent hemoptysis over the past two months. He reports seeing specks of blood in his sputum approximately three times during this period, with the last episode occurring a month ago. The hemoptysis is not a daily occurrence and seems to be triggered when he bends his neck, such as when washing his hair. He describes a sensation of fullness and soreness in his throat when bending his neck, which he attributes to his known thyroid nodules.  The patient underwent a biopsy of the thyroid nodules, the results of which were reportedly benign according to the treating ENT specialist. He is scheduled for a second opinion with an endocrinologist in February.  The patient denies any current sinus infection symptoms but has a long-standing history of intermittent sinus problems. He has a very light history of smoking, limited to sporadic use in his twenties, primarily when consuming alcohol. He has not smoked or consumed alcohol for the past fifty years.   Htn- bp is up but has not been on metoprolol for past 2 days. No cardiac or neurologic signs/symptoms.       Review of Systems  Constitutional:  Negative for chills, fatigue and fever.  HENT:  Negative for congestion, ear pain, sinus pressure and sinus pain.        When flexes neck mild thyroid area discomfort.  Respiratory:  Negative for cough, chest tightness, shortness of breath and wheezing.   Cardiovascular:  Negative for chest pain and palpitations.  Gastrointestinal:  Negative for abdominal pain.  Genitourinary:  Negative for dysuria and frequency.  Neurological:  Negative for dizziness, syncope, weakness and  light-headedness.  Hematological:  Negative for adenopathy. Does not bruise/bleed easily.  Psychiatric/Behavioral:  Negative for behavioral problems and hallucinations.     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   Enlarged prostate with urinary obstruction 05/11/2020   Essential hypertension 03/11/2017   Hypertension    Hyperthyroidism 06/26/2017   Medication management 06/06/2020   Multinodular goiter 07/03/2017   Nocturia 02/02/2014   NSTEMI (non-ST elevated myocardial infarction) (HCC) 02/27/2017   Palpitations 10/28/2019   Paroxysmal atrial fibrillation (HCC) 05/24/2020   Supraventricular tachycardia (HCC) 02/14/2018   Type 2 diabetes mellitus with complication, without long-term current use of insulin (HCC) 03/11/2017     Social History   Socioeconomic History   Marital status: Divorced    Spouse name: Not on file   Number of children: Not on file   Years of education: Not on file   Highest education level: Not on file  Occupational History   Not on file  Tobacco Use   Smoking status: Never   Smokeless tobacco: Never  Vaping Use   Vaping status: Never Used  Substance and Sexual Activity   Alcohol use: Yes    Comment: weekly   Drug use: No   Sexual activity: Not on file  Other Topics Concern   Not on file  Social History Narrative  Not on file   Social Drivers of Health   Financial Resource Strain: Low Risk  (05/09/2023)   Overall Financial Resource Strain (CARDIA)    Difficulty of Paying Living Expenses: Not hard at all  Food Insecurity: No Food Insecurity (05/09/2023)   Hunger Vital Sign    Worried About Running Out of Food in the Last Year: Never true    Ran Out of Food in the Last Year: Never true  Transportation Needs: No Transportation  Needs (05/09/2023)   PRAPARE - Administrator, Civil Service (Medical): No    Lack of Transportation (Non-Medical): No  Physical Activity: Insufficiently Active (05/09/2023)   Exercise Vital Sign    Days of Exercise per Week: 2 days    Minutes of Exercise per Session: 20 min  Stress: No Stress Concern Present (05/09/2023)   [employer] of Occupational Health - Occupational Stress Questionnaire    Feeling of Stress : Not at all  Social Connections: Moderately Integrated (05/09/2023)   Social Connection and Isolation Panel [NHANES]    Frequency of Communication with Friends and Family: More than three times a week    Frequency of Social Gatherings with Friends and Family: More than three times a week    Attends Religious Services: More than 4 times per year    Active Member of [employer] or Organizations: Yes    Attends Banker Meetings: More than 4 times per year    Marital Status: Divorced  Intimate Partner Violence: Not At Risk (05/09/2023)   Humiliation, Afraid, Rape, and Kick questionnaire    Fear of Current or Ex-Partner: No    Emotionally Abused: No    Physically Abused: No    Sexually Abused: No    Past Surgical History:  Procedure Laterality Date   KNEE SURGERY     LEFT HEART CATH AND CORONARY ANGIOGRAPHY N/A 02/28/2017   Procedure: LEFT HEART CATH AND CORONARY ANGIOGRAPHY;  Surgeon: Kathleene Hazel, MD;  Location: [facility];  Service: Cardiovascular;  Laterality: N/A;    Family History  Problem Relation Age of Onset   Hypertension Father    Hypertension Mother     Not on File  Current Outpatient Medications on File Prior to Visit  Medication Sig Dispense Refill   ALPRAZolam (XANAX) 0.5 MG tablet Take 0.25-0.5 mg by mouth 3 (three) times daily as needed for anxiety.     apixaban (ELIQUIS) 5 MG TABS tablet Take 1 tablet (5 mg total) by mouth 2 (two) times daily. 180 tablet 1   atorvastatin (LIPITOR) 80 MG tablet Take 1 tablet (80  mg total) by mouth daily. 90 tablet 0   cloNIDine (CATAPRES) 0.1 MG tablet Take 1 tablet (0.1 mg total) by mouth daily. 90 tablet 2   diltiazem (CARDIZEM CD) 360 MG 24 hr capsule Take 1 capsule (360 mg total) by mouth daily. 90 capsule 3   esomeprazole (NEXIUM) 20 MG capsule Take 20 mg by mouth daily at 12 noon.     ezetimibe (ZETIA) 10 MG tablet TAKE 1 TABLET(10 MG) BY MOUTH DAILY 90 tablet 1   finasteride (PROSCAR) 5 MG tablet Take 5 mg by mouth daily.     flecainide (TAMBOCOR) 50 MG tablet TAKE 1 TABLET(50 MG) BY MOUTH TWICE DAILY 180 tablet 2   glipiZIDE (GLUCOTROL) 10 MG tablet Take 1 tablet (10 mg total) by mouth 2 (two) times daily. 180 tablet 0   glucose blood (ACCU-CHEK AVIVA PLUS) test strip Check blood sugar twice  a day.  Dx: E11.9 (Patient taking differently: 1 each by Other route as needed for other (see below). Check blood sugar twice a day.  Dx: E11.9) 200 each 1   guaiFENesin (MUCINEX) 600 MG 12 hr tablet Take 600 mg by mouth daily as needed for to loosen phlegm or cough.     hydrOXYzine (ATARAX/VISTARIL) 10 MG tablet Take 10 mg by mouth in the morning and at bedtime.     Insulin Pen Needle (PIP PEN NEEDLES 32G X ) 32G X 4 MM MISC 1 each by Does not apply route as directed. Dx Code: E11.8 100 each 5   LANTUS SOLOSTAR 100 UNIT/ML Solostar Pen INJECT 30 UNITS UNDER THE SKIN EVERY MORNING AND 50 UNITS EVERY EVENING 60 mL 0   losartan (COZAAR) 50 MG tablet TAKE 1 TABLET(50 MG) BY MOUTH DAILY 90 tablet 1   metFORMIN (GLUCOPHAGE) 1000 MG tablet TAKE 1 TABLET(1000 MG) BY MOUTH TWICE DAILY WITH A MEAL 180 tablet 0   nitroGLYCERIN (NITROSTAT) 0.4 MG SL tablet Place 1 tablet (0.4 mg total) under the tongue every 5 (five) minutes as needed for chest pain. 25 tablet 2   OVER THE COUNTER MEDICATION Take 1 tablet by mouth daily. Beta Prostate     potassium chloride (KLOR-CON) 10 MEQ tablet Take 1 tablet (10 mEq total) by mouth daily. 90 tablet 2   tamsulosin (FLOMAX) 0.4 MG CAPS capsule Take 1  capsule (0.4 mg total) by mouth daily after supper. 90 capsule 3   torsemide (DEMADEX) 20 MG tablet Take 1 tablet (20 mg total) by mouth daily. 90 tablet 2   No current facility-administered medications on file prior to visit.    BP (!) 170/80   Pulse 96   Resp 18   Ht 5\' 7"  (1.702 m)   Wt 255 lb 6.4 oz (115.8 kg)   SpO2 100%   BMI 40.00 kg/m        Objective:   Physical Exam  General Mental Status- Alert. General Appearance- Not in acute distress.   Skin General: Color- Normal Color. Moisture- Normal Moisture.  Neck Thyromegaly. No redness warmth or tender over anterior neck.  Chest and Lung Exam Auscultation: Breath Sounds:-CTA  Cardiovascular Auscultation:Rythm- RRR Murmurs & Other Heart Sounds:Auscultation of the heart reveals- No Murmurs.  Abdomen Inspection:-Inspeection Normal. Palpation/Percussion:Note:No mass. Palpation and Percussion of the abdomen reveal- Non Tender, Non Distended + BS, no rebound or guarding.   Neurologic Cranial Nerve exam:- CN III-XII intact(No nystagmus), symmetric smile. Strength:- 5/5 equal and symmetric strength both upper and lower extremities.   Heent-  no sinus pressure.  Lower ext- calfs symmetric. No pedal edema. Negative homans signs.    Assessment & Plan:   Assessment and Plan    Hemoptysis Occasional specks of blood in sputum over the past two months, last episode one month ago. No current sinus infection symptoms. Light history of smoking in the past. -Order chest x-ray to investigate cause of hemoptysis.  Thyroid nodules Patient reports discomfort when bending neck. Previous biopsy reportedly benign, but pathology report not reviewed. Second opinion scheduled with endocrinologist in February. -Obtain copy of previous biopsy pathology report for review. -Keep scheduled appointment with endocrinologist for second opinion.   Htn- bp is high today but has not been on metoprolol since saturday. In the past when ran  out of metoprolol bp did spike. No cardiac or neurologic signs/symptom.  - Refilled metroprolol today. On losartan, clonidine and cardizem - continue all other cardiac meds as well.  For insomnia stop trazadone and try melatonin 3 mg at night.   Follow up this Friday for bp check or sooner if needed. If any cardiac or neurologic signs or symptoms be seen in ED.        Time spent with patient today was 40  minutes which consisted of chart revdiew, discussing diagnosis, work up treatment and documentation.

## 2023-05-17 ENCOUNTER — Encounter: Payer: Self-pay | Admitting: Medical

## 2023-05-17 ENCOUNTER — Ambulatory Visit (INDEPENDENT_AMBULATORY_CARE_PROVIDER_SITE_OTHER): Payer: Medicare Other | Admitting: Medical

## 2023-05-17 VITALS — BP 140/80 | HR 88 | Resp 18 | Ht 67.0 in | Wt 250.6 lb

## 2023-05-17 DIAGNOSIS — I10 Essential (primary) hypertension: Secondary | ICD-10-CM

## 2023-05-17 DIAGNOSIS — R042 Hemoptysis: Secondary | ICD-10-CM | POA: Diagnosis not present

## 2023-05-17 DIAGNOSIS — G4700 Insomnia, unspecified: Secondary | ICD-10-CM | POA: Diagnosis not present

## 2023-05-17 DIAGNOSIS — E041 Nontoxic single thyroid nodule: Secondary | ICD-10-CM | POA: Diagnosis not present

## 2023-05-17 MED ORDER — AZELASTINE HCL 0.1 % NA SOLN: 2.0000 | Freq: Two times a day (BID) | NASAL | 12 refills | Status: AC

## 2023-05-17 NOTE — Patient Instructions (Signed)
Hypertension Elevated blood pressure readings at home and in clinic despite being on multiple antihypertensives (Losartan, Clonidine, Cardizem, Metoprolol). -Increase Losartan to 100mg  daily (two 50mg  tablets). -Continue Metoprolol, Clonidine, and Cardizem as previously prescribed. -Check blood pressure in one week and bring home blood pressure machine for calibration check.  Hemoptysis Previously reported specks of blood in sputum, which has since resolved. No abnormalities on lung examination and clear chest X-ray. -If specks of blood in sputum return, consider referral to ENT or Pulmonologist.  Thyroid Nodules Possible cause of neck discomfort when flexing neck. -Keep upcoming appointment with Endocrinologist and obtain biopsy report from ENT office.  Insomnia Difficulty sleeping, has not yet tried Melatonin. -Purchase and try Melatonin 3mg  at night.  Nasal Congestion(allergic rhinitis vs vasomotor rhinitis) Despite using Flonase, still experiencing nasal congestion. -Discontinue Flonase. -Start Astelin nasal spray.  Follow-up in one week to check blood pressure and calibrate home machine.

## 2023-05-17 NOTE — Progress Notes (Signed)
Subjective:    Patient ID: Tyler Rim., male    DOB: December 06, 1950, 73 y.o.   MRN: [id number]  HPI Discussed the use of AI scribe software for clinical note transcription with the patient, who gave verbal consent to proceed.  History of Present Illness   The patient, with a history of hypertension, presented for a follow-up visit due to a previous high blood pressure reading of 170/80. The patient had run out of metoprolol at the time of the last visit but was still on losartan, clonidine, and Cardizem. The patient reported that his blood pressure has improved since restarting metoprolol, with a reading of 150/76 at the current visit initially but better when checked manually by myself.    The patient's blood pressure readings at home have been high, with readings of 170/100 and 160  The patient also reported a history of coughing up blood-tinged mucus, which he described as specks of blood in his sputum. This symptom had been present for a month but has since resolved.   The patient also reported having thyroid nodules and experiencing pain when flexing his neck. He has an upcoming appointment with an endocrinologist to further evaluate these nodules.  The patient also reported experiencing insomnia and has not yet tried the recommended melatonin.    He also reported nasal congestion, despite using Flonase.        Review of Systems  Constitutional:  Negative for chills, fatigue and fever.  HENT:  Positive for congestion.   Respiratory:  Negative for cough, chest tightness, shortness of breath and wheezing.   Cardiovascular:  Negative for chest pain and palpitations.  Gastrointestinal:  Negative for abdominal pain.  Endocrine:       Mild anterior neck discomfort over thyroid.  Genitourinary:  Negative for dysuria and frequency.  Musculoskeletal:  Negative for back pain.  Skin:  Negative for rash.  Neurological:  Negative for dizziness, weakness and headaches.  Hematological:   Negative for adenopathy. Does not bruise/bleed easily.  Psychiatric/Behavioral:  Negative for behavioral problems and decreased concentration.     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   Enlarged prostate with urinary obstruction 05/11/2020   Essential hypertension 03/11/2017   Hypertension    Hyperthyroidism 06/26/2017   Medication management 06/06/2020   Multinodular goiter 07/03/2017   Nocturia 02/02/2014   NSTEMI (non-ST elevated myocardial infarction) (HCC) 02/27/2017   Palpitations 10/28/2019   Paroxysmal atrial fibrillation (HCC) 05/24/2020   Supraventricular tachycardia (HCC) 02/14/2018   Type 2 diabetes mellitus with complication, without long-term current use of insulin (HCC) 03/11/2017     Social History   Socioeconomic History   Marital status: Divorced    Spouse name: Not on file   Number of children: Not on file   Years of education: Not on file   Highest education level: Not on file  Occupational History   Not on file  Tobacco Use   Smoking status: Never   Smokeless tobacco: Never  Vaping Use   Vaping status: Never Used  Substance and Sexual Activity   Alcohol use: Yes    Comment: weekly   Drug use: No   Sexual activity: Not on file  Other Topics Concern   Not on file  Social History Narrative   Not  on file   Social Drivers of Health   Financial Resource Strain: Low Risk  (05/09/2023)   Overall Financial Resource Strain (CARDIA)    Difficulty of Paying Living Expenses: Not hard at all  Food Insecurity: No Food Insecurity (05/09/2023)   Hunger Vital Sign    Worried About Running Out of Food in the Last Year: Never true    Ran Out of Food in the Last Year: Never true  Transportation Needs: No Transportation Needs (05/09/2023)    PRAPARE - Administrator, Civil Service (Medical): No    Lack of Transportation (Non-Medical): No  Physical Activity: Insufficiently Active (05/09/2023)   Exercise Vital Sign    Days of Exercise per Week: 2 days    Minutes of Exercise per Session: 20 min  Stress: No Stress Concern Present (05/09/2023)   [employer] of Occupational Health - Occupational Stress Questionnaire    Feeling of Stress : Not at all  Social Connections: Moderately Integrated (05/09/2023)   Social Connection and Isolation Panel [NHANES]    Frequency of Communication with Friends and Family: More than three times a week    Frequency of Social Gatherings with Friends and Family: More than three times a week    Attends Religious Services: More than 4 times per year    Active Member of [employer] or Organizations: Yes    Attends Banker Meetings: More than 4 times per year    Marital Status: Divorced  Intimate Partner Violence: Not At Risk (05/09/2023)   Humiliation, Afraid, Rape, and Kick questionnaire    Fear of Current or Ex-Partner: No    Emotionally Abused: No    Physically Abused: No    Sexually Abused: No    Past Surgical History:  Procedure Laterality Date   KNEE SURGERY     LEFT HEART CATH AND CORONARY ANGIOGRAPHY N/A 02/28/2017   Procedure: LEFT HEART CATH AND CORONARY ANGIOGRAPHY;  Surgeon: Kathleene Hazel, MD;  Location: [facility];  Service: Cardiovascular;  Laterality: N/A;    Family History  Problem Relation Age of Onset   Hypertension Father    Hypertension Mother     Not on File  Current Outpatient Medications on File Prior to Visit  Medication Sig Dispense Refill   ALPRAZolam (XANAX) 0.5 MG tablet Take 0.25-0.5 mg by mouth 3 (three) times daily as needed for anxiety.     apixaban (ELIQUIS) 5 MG TABS tablet Take 1 tablet (5 mg total) by mouth 2 (two) times daily. 180 tablet 1   atorvastatin (LIPITOR) 80 MG tablet Take 1 tablet (80 mg total) by mouth  daily. 90 tablet 0   cloNIDine (CATAPRES) 0.1 MG tablet Take 1 tablet (0.1 mg total) by mouth daily. 90 tablet 2   diltiazem (CARDIZEM CD) 360 MG 24 hr capsule Take 1 capsule (360 mg total) by mouth daily. 90 capsule 3   esomeprazole (NEXIUM) 20 MG capsule Take 20 mg by mouth daily at 12 noon.     ezetimibe (ZETIA) 10 MG tablet TAKE 1 TABLET(10 MG) BY MOUTH DAILY 90 tablet 1   finasteride (PROSCAR) 5 MG tablet Take 5 mg by mouth daily.     flecainide (TAMBOCOR) 50 MG tablet TAKE 1 TABLET(50 MG) BY MOUTH TWICE DAILY 180 tablet 2   glipiZIDE (GLUCOTROL) 10 MG tablet Take 1 tablet (10 mg total) by mouth 2 (two) times daily. 180 tablet 0   glucose blood (ACCU-CHEK AVIVA PLUS) test strip Check blood sugar twice a  day.  Dx: E11.9 (Patient taking differently: 1 each by Other route as needed for other (see below). Check blood sugar twice a day.  Dx: E11.9) 200 each 1   guaiFENesin (MUCINEX) 600 MG 12 hr tablet Take 600 mg by mouth daily as needed for to loosen phlegm or cough.     hydrOXYzine (ATARAX/VISTARIL) 10 MG tablet Take 10 mg by mouth in the morning and at bedtime.     Insulin Pen Needle (PIP PEN NEEDLES 32G X ) 32G X 4 MM MISC 1 each by Does not apply route as directed. Dx Code: E11.8 100 each 5   LANTUS SOLOSTAR 100 UNIT/ML Solostar Pen INJECT 30 UNITS UNDER THE SKIN EVERY MORNING AND 50 UNITS EVERY EVENING 60 mL 0   losartan (COZAAR) 50 MG tablet TAKE 1 TABLET(50 MG) BY MOUTH DAILY 90 tablet 1   metFORMIN (GLUCOPHAGE) 1000 MG tablet TAKE 1 TABLET(1000 MG) BY MOUTH TWICE DAILY WITH A MEAL 180 tablet 0   metoprolol (TOPROL-XL) 200 MG 24 hr tablet Take 1 tablet (200 mg total) by mouth daily. 90 tablet 3   nitroGLYCERIN (NITROSTAT) 0.4 MG SL tablet Place 1 tablet (0.4 mg total) under the tongue every 5 (five) minutes as needed for chest pain. 25 tablet 2   OVER THE COUNTER MEDICATION Take 1 tablet by mouth daily. Beta Prostate     potassium chloride (KLOR-CON) 10 MEQ tablet Take 1 tablet (10 mEq  total) by mouth daily. 90 tablet 2   tamsulosin (FLOMAX) 0.4 MG CAPS capsule Take 1 capsule (0.4 mg total) by mouth daily after supper. 90 capsule 3   torsemide (DEMADEX) 20 MG tablet Take 1 tablet (20 mg total) by mouth daily. 90 tablet 2   No current facility-administered medications on file prior to visit.    BP (!) 140/80   Pulse 88   Resp 18   Ht 5\' 7"  (1.702 m)   Wt 250 lb 9.6 oz (113.7 kg)   SpO2 97%   BMI 39.25 kg/m        Objective:   Physical Exam   General Mental Status- Alert. General Appearance- Not in acute distress.    Skin General: Color- Normal Color. Moisture- Normal Moisture.   Neck Thyromegaly. No redness warmth or tender over anterior neck.   Chest and Lung Exam Auscultation: Breath Sounds:-CTA   Cardiovascular Auscultation:Rythm- RRR Murmurs & Other Heart Sounds:Auscultation of the heart reveals- No Murmurs.   Abdomen Inspection:-Inspeection Normal. Palpation/Percussion:Note:No mass. Palpation and Percussion of the abdomen reveal- Non Tender, Non Distended + BS, no rebound or guarding.     Neurologic Cranial Nerve exam:- CN III-XII intact(No nystagmus), symmetric smile. Strength:- 5/5 equal and symmetric strength both upper and lower extremities.    Heent-  no sinus pressure.   Lower ext- calfs symmetric. No pedal edema. Negative homans signs.     Assessment & Plan:   Patient Instructions  Hypertension Elevated blood pressure readings at home and in clinic despite being on multiple antihypertensives (Losartan, Clonidine, Cardizem, Metoprolol). -Increase Losartan to 100mg  daily (two 50mg  tablets). -Continue Metoprolol, Clonidine, and Cardizem as previously prescribed. -Check blood pressure in one week and bring home blood pressure machine for calibration check.  Hemoptysis Previously reported specks of blood in sputum, which has since resolved. No abnormalities on lung examination and clear chest X-ray. -If specks of blood in sputum  return, consider referral to ENT or Pulmonologist.  Thyroid Nodules Possible cause of neck discomfort when flexing neck. -Keep upcoming appointment with  Endocrinologist and obtain biopsy report from ENT office.  Insomnia Difficulty sleeping, has not yet tried Melatonin. -Purchase and try Melatonin 3mg  at night.  Nasal Congestion(allergic rhinitis vs vasomotor rhinitis) Despite using Flonase, still experiencing nasal congestion. -Discontinue Flonase. -Start Astelin nasal spray.  Follow-up in one week to check blood pressure and calibrate home machine.

## 2023-05-21 ENCOUNTER — Other Ambulatory Visit: Payer: Self-pay | Admitting: Medical

## 2023-05-21 ENCOUNTER — Ambulatory Visit: Payer: Medicare Other | Attending: Cardiology | Admitting: Cardiology

## 2023-05-21 ENCOUNTER — Encounter: Payer: Self-pay | Admitting: Cardiology

## 2023-05-21 VITALS — BP 150/90 | HR 85 | Ht 67.0 in | Wt 252.0 lb

## 2023-05-21 DIAGNOSIS — E119 Type 2 diabetes mellitus without complications: Secondary | ICD-10-CM | POA: Diagnosis not present

## 2023-05-21 DIAGNOSIS — I480 Paroxysmal atrial fibrillation: Secondary | ICD-10-CM

## 2023-05-21 DIAGNOSIS — I10 Essential (primary) hypertension: Secondary | ICD-10-CM

## 2023-05-21 DIAGNOSIS — R0609 Other forms of dyspnea: Secondary | ICD-10-CM

## 2023-05-21 DIAGNOSIS — I2510 Atherosclerotic heart disease of native coronary artery without angina pectoris: Secondary | ICD-10-CM

## 2023-05-21 NOTE — Addendum Note (Signed)
Addended by: Baldo Ash D on: 05/21/2023 10:21 AM   Modules accepted: Orders

## 2023-05-21 NOTE — Progress Notes (Signed)
Cardiology Office Note:    Date:  05/21/2023   ID:  Tyler Cancel Sr., DOB 12-Oct-1950, MRN [id number]  PCP:  Esperanza Richters, PA-C  Cardiologist:  Gypsy Balsam, MD    Referring MD: Esperanza Richters, [place]   Chief Complaint  Patient presents with   Follow-up    History of Present Illness:    Tyler Cancel Sr. is a 73 y.o. male  past medical history significant for coronary artery disease. In 2019 he had cardiac catheterization which showed 10% proximal 10% mid RCA. Since that time he did have a stress test which was negative, additional problem include paroxysmal atrial fibrillation he is on flecainide as well as anticoagulation, essential hypertension, diabetes, dyslipidemia.  Comes today to months for follow-up overall doing fine.  Denies have any chest pain tightness squeezing pressure burning chest does have some minimal swelling of lower extremities.  His blood pressure still elevated.  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   Enlarged prostate with urinary obstruction 05/11/2020   Essential hypertension 03/11/2017   Hypertension    Hyperthyroidism 06/26/2017   Medication management 06/06/2020   Multinodular goiter 07/03/2017   Nocturia 02/02/2014   NSTEMI (non-ST elevated myocardial infarction) (HCC) 02/27/2017   Palpitations 10/28/2019   Paroxysmal atrial fibrillation (HCC) 05/24/2020   Supraventricular tachycardia (HCC) 02/14/2018   Type 2 diabetes mellitus with complication, without long-term current use of insulin (HCC) 03/11/2017    Past Surgical History:  Procedure Laterality Date   KNEE SURGERY     LEFT HEART CATH AND CORONARY ANGIOGRAPHY N/A 02/28/2017   Procedure: LEFT HEART CATH AND CORONARY ANGIOGRAPHY;  Surgeon: Kathleene Hazel, MD;   Location: [facility];  Service: Cardiovascular;  Laterality: N/A;    Current Medications: Current Meds  Medication Sig   ALPRAZolam (XANAX) 0.5 MG tablet Take 0.25-0.5 mg by mouth 3 (three) times daily as needed for anxiety.   apixaban (ELIQUIS) 5 MG TABS tablet Take 1 tablet (5 mg total) by mouth 2 (two) times daily.   atorvastatin (LIPITOR) 80 MG tablet Take 1 tablet (80 mg total) by mouth daily.   azelastine (ASTELIN) 0.1 % nasal spray Place 2 sprays into both nostrils 2 (two) times daily. Use in each nostril as directed   cloNIDine (CATAPRES) 0.1 MG tablet Take 1 tablet (0.1 mg total) by mouth daily.   diltiazem (CARDIZEM CD) 360 MG 24 hr capsule Take 1 capsule (360 mg total) by mouth daily.   esomeprazole (NEXIUM) 20 MG capsule Take 20 mg by mouth daily at 12 noon.   ezetimibe (ZETIA) 10 MG tablet TAKE 1 TABLET(10 MG) BY MOUTH DAILY (Patient taking differently: Take 10 mg by mouth daily.)   finasteride (PROSCAR) 5 MG tablet Take 5 mg by mouth daily.   flecainide (TAMBOCOR) 50 MG tablet TAKE 1 TABLET(50 MG) BY MOUTH TWICE DAILY (Patient taking differently: Take 50 mg by mouth 2 (two) times daily.)   glipiZIDE (GLUCOTROL) 10 MG tablet Take 1 tablet (10 mg total) by mouth 2 (two) times daily.   glucose blood (ACCU-CHEK AVIVA PLUS) test strip Check blood sugar twice a day.  Dx: E11.9 (Patient taking differently: 1 each by Other route as needed for other (see below). Check blood sugar twice a day.  Dx: E11.9)  guaiFENesin (MUCINEX) 600 MG 12 hr tablet Take 600 mg by mouth daily as needed for to loosen phlegm or cough.   hydrOXYzine (ATARAX/VISTARIL) 10 MG tablet Take 10 mg by mouth in the morning and at bedtime.   Insulin Pen Needle (PIP PEN NEEDLES 32G X ) 32G X 4 MM MISC 1 each by Does not apply route as directed. Dx Code: E11.8   LANTUS SOLOSTAR 100 UNIT/ML Solostar Pen INJECT 30 UNITS UNDER THE SKIN EVERY MORNING AND 50 UNITS EVERY EVENING (Patient taking differently: Inject  30-50 Units into the skin 2 (two) times daily.)   losartan (COZAAR) 50 MG tablet TAKE 1 TABLET(50 MG) BY MOUTH DAILY (Patient taking differently: Take 50 mg by mouth daily.)   metFORMIN (GLUCOPHAGE) 1000 MG tablet TAKE 1 TABLET(1000 MG) BY MOUTH TWICE DAILY WITH A MEAL (Patient taking differently: Take 1,000 mg by mouth 2 (two) times daily with a meal.)   metoprolol (TOPROL-XL) 200 MG 24 hr tablet Take 1 tablet (200 mg total) by mouth daily.   OVER THE COUNTER MEDICATION Take 1 tablet by mouth daily. Beta Prostate   potassium chloride (KLOR-CON) 10 MEQ tablet Take 1 tablet (10 mEq total) by mouth daily.   tamsulosin (FLOMAX) 0.4 MG CAPS capsule Take 1 capsule (0.4 mg total) by mouth daily after supper.   torsemide (DEMADEX) 20 MG tablet Take 1 tablet (20 mg total) by mouth daily.     Allergies:   Patient has no known allergies.   Social History   Socioeconomic History   Marital status: Divorced    Spouse name: Not on file   Number of children: Not on file   Years of education: Not on file   Highest education level: Not on file  Occupational History   Not on file  Tobacco Use   Smoking status: Never   Smokeless tobacco: Never  Vaping Use   Vaping status: Never Used  Substance and Sexual Activity   Alcohol use: Yes    Comment: weekly   Drug use: No   Sexual activity: Not on file  Other Topics Concern   Not on file  Social History Narrative   Not on file   Social Drivers of Health   Financial Resource Strain: Low Risk  (05/09/2023)   Overall Financial Resource Strain (CARDIA)    Difficulty of Paying Living Expenses: Not hard at all  Food Insecurity: No Food Insecurity (05/09/2023)   Hunger Vital Sign    Worried About Running Out of Food in the Last Year: Never true    Ran Out of Food in the Last Year: Never true  Transportation Needs: No Transportation Needs (05/09/2023)   PRAPARE - Administrator, Civil Service (Medical): No    Lack of Transportation  (Non-Medical): No  Physical Activity: Insufficiently Active (05/09/2023)   Exercise Vital Sign    Days of Exercise per Week: 2 days    Minutes of Exercise per Session: 20 min  Stress: No Stress Concern Present (05/09/2023)   [employer] of Occupational Health - Occupational Stress Questionnaire    Feeling of Stress : Not at all  Social Connections: Moderately Integrated (05/09/2023)   Social Connection and Isolation Panel [NHANES]    Frequency of Communication with Friends and Family: More than three times a week    Frequency of Social Gatherings with Friends and Family: More than three times a week    Attends Religious Services: More than 4 times per year    Active Member  of Clubs or Organizations: Yes    Attends Engineer, structural: More than 4 times per year    Marital Status: Divorced     Family History: The patient's family history includes Hypertension in his father and mother. ROS:   Please see the history of present illness.    All 14 point review of systems negative except as described per history of present illness  EKGs/Labs/Other Studies Reviewed:    EKG Interpretation Date/Time:  Tuesday May 21 2023 09:40:50 EST Ventricular Rate:  90 PR Interval:  174 QRS Duration:  80 QT Interval:  362 QTC Calculation: 442 R Axis:   -34  Text Interpretation: Normal sinus rhythm Left axis deviation When compared with ECG of 25-Sep-2021 14:35, QRS axis Shifted left Confirmed by Gypsy Balsam [id number]) on 05/21/2023 10:00:51 AM    Recent Labs: 12/06/2022: ALT 16; BUN 12; Creatinine, Ser 0.99; Potassium 4.1; Sodium 138; TSH 0.55  Recent Lipid Panel    Component Value Date/Time   CHOL 114 11/27/2021 0827   CHOL 118 03/23/2021 1515   TRIG 112.0 11/27/2021 0827   HDL 38.70 (L) 11/27/2021 0827   HDL 43 03/23/2021 1515   CHOLHDL 3 11/27/2021 0827   VLDL 22.4 11/27/2021 0827   LDLCALC 53 11/27/2021 0827   LDLCALC 63 03/23/2021 1515    Physical Exam:    VS:   BP (!) 150/90 (BP Location: Right Arm, Patient Position: Sitting)   Pulse 85   Ht 5\' 7"  (1.702 m)   Wt 252 lb (114.3 kg)   SpO2 97%   BMI 39.47 kg/m     Wt Readings from Last 3 Encounters:  05/21/23 252 lb (114.3 kg)  05/17/23 250 lb 9.6 oz (113.7 kg)  05/13/23 255 lb 6.4 oz (115.8 kg)     GEN:  Well nourished, well developed in no acute distress HEENT: Normal NECK: No JVD; No carotid bruits LYMPHATICS: No lymphadenopathy CARDIAC: RRR, no murmurs, no rubs, no gallops RESPIRATORY:  Clear to auscultation without rales, wheezing or rhonchi  ABDOMEN: Soft, non-tender, non-distended MUSCULOSKELETAL:  No edema; No deformity  SKIN: Warm and dry LOWER EXTREMITIES: no swelling NEUROLOGIC:  Alert and oriented x 3 PSYCHIATRIC:  Normal affect   ASSESSMENT:    1. Paroxysmal atrial fibrillation (HCC)   2. Coronary artery disease involving native coronary artery of native heart without angina pectoris   3. Essential hypertension   4. Diabetes mellitus without complication (HCC)    PLAN:    In order of problems listed above:  Paroxysmal atrial fibrillation described very rare short lasting palpitations he said he is fine he does not need to do anything about it.  Continue anticoagulation and flecainide. Coronary disease only minimal based on cardiac catheterization.  Continue monitoring risk factors modifications. Essential hypertension blood pressure elevated.  He was just recently given higher dose of losartan 50 mg daily will check Chem-7 today in 2 days he see his primary care physician I recommend to increase dose of losartan if blood pressure still elevated. Diabetes mellitus high last hemoglobin A1c from summer of last year was 7.8 still on the higher side need to do better control   Medication Adjustments/Labs and Tests Ordered: Current medicines are reviewed at length with the patient today.  Concerns regarding medicines are outlined above.  Orders Placed This Encounter   Procedures   EKG 12-Lead   Medication changes: No orders of the defined types were placed in this encounter.   Signed, Georgeanna Lea, MD, [facility] 05/21/2023  10:07 AM    [facility]

## 2023-05-21 NOTE — Patient Instructions (Addendum)
Medication Instructions:  Your physician recommends that you continue on your current medications as directed. Please refer to the Current Medication list given to you today.  *If you need a refill on your cardiac medications before your next appointment, please call your pharmacy*   Lab Work: BMP, ProBNP-today If you have labs (blood work) drawn today and your tests are completely normal, you will receive your results only by: MyChart Message (if you have MyChart) OR A paper copy in the mail If you have any lab test that is abnormal or we need to change your treatment, we will call you to review the results.   Testing/Procedures: None Ordered   Follow-Up: At [facility], you and your health needs are our priority.  As part of our continuing mission to provide you with exceptional heart care, we have created designated Provider Care Teams.  These Care Teams include your primary Cardiologist (physician) and Advanced Practice Providers (APPs -  Physician Assistants and Nurse Practitioners) who all work together to provide you with the care you need, when you need it.  We recommend signing up for the patient portal called "MyChart".  Sign up information is provided on this After Visit Summary.  MyChart is used to connect with patients for Virtual Visits (Telemedicine).  Patients are able to view lab/test results, encounter notes, upcoming appointments, etc.  Non-urgent messages can be sent to your provider as well.   To learn more about what you can do with MyChart, go to [email or web address].    Your next appointment:   6 month(s)  The format for your next appointment:   In Person  Provider:   Gypsy Balsam, MD    Other Instructions NA

## 2023-05-22 LAB — BASIC METABOLIC PANEL
BUN/Creatinine Ratio: 8 — ABNORMAL LOW (ref 10–24)
BUN: 8 mg/dL (ref 8–27)
CO2: 22 mmol/L (ref 20–29)
Calcium: 9.1 mg/dL (ref 8.6–10.2)
Chloride: 104 mmol/L (ref 96–106)
Creatinine, Ser: 0.96 mg/dL (ref 0.76–1.27)
Glucose: 145 mg/dL — ABNORMAL HIGH (ref 70–99)
Potassium: 4.6 mmol/L (ref 3.5–5.2)
Sodium: 142 mmol/L (ref 134–144)
eGFR: 84 mL/min/{1.73_m2} (ref >59)

## 2023-05-22 LAB — PRO B NATRIURETIC PEPTIDE: NT-Pro BNP: 282 pg/mL (ref 0–376)

## 2023-05-24 ENCOUNTER — Ambulatory Visit (INDEPENDENT_AMBULATORY_CARE_PROVIDER_SITE_OTHER): Payer: Medicare Other | Admitting: Medical

## 2023-05-24 VITALS — BP 134/66 | HR 71 | Resp 18 | Ht 67.0 in | Wt 255.2 lb

## 2023-05-24 DIAGNOSIS — Z7984 Long term (current) use of oral hypoglycemic drugs: Secondary | ICD-10-CM | POA: Diagnosis not present

## 2023-05-24 DIAGNOSIS — G4700 Insomnia, unspecified: Secondary | ICD-10-CM

## 2023-05-24 DIAGNOSIS — E041 Nontoxic single thyroid nodule: Secondary | ICD-10-CM | POA: Diagnosis not present

## 2023-05-24 DIAGNOSIS — E118 Type 2 diabetes mellitus with unspecified complications: Secondary | ICD-10-CM | POA: Diagnosis not present

## 2023-05-24 DIAGNOSIS — I10 Essential (primary) hypertension: Secondary | ICD-10-CM | POA: Diagnosis not present

## 2023-05-24 DIAGNOSIS — Z794 Long term (current) use of insulin: Secondary | ICD-10-CM

## 2023-05-24 DIAGNOSIS — R0981 Nasal congestion: Secondary | ICD-10-CM

## 2023-05-24 LAB — HEMOGLOBIN A1C: Hgb A1c MFr Bld: 9 % — ABNORMAL HIGH (ref 4.6–6.5)

## 2023-05-24 NOTE — Progress Notes (Signed)
   Subjective:    Patient ID: Tyler Ellis., male    DOB: 30-Aug-1950, 73 y.o.   MRN: [id number]  Discussed the use of AI scribe software for clinical note transcription with the patient, who gave verbal consent to proceed.  History of Present Illness   The patient presents for follow-up regarding thyroid biopsy results and management of hypertension and diabetes.  The patient is following up on a thyroid biopsy performed in October 2024, which showed atypia of undetermined significance.  He has a history of hypertension, with a current blood pressure reading controlled today showing improvement from the last visit. His current medications include metoprolol, clonidine, Cardizem, and losartan, which was recently increased from 50 mg to 100 mg daily. His home blood pressure machine is 70-90 years old and malfunctioning.  He has a history of diabetes, with a recent blood sugar level of 145 mg/dL and an D6L of 8.7% as of December 06, 2022. He is on two medications and insulin for diabetes management, and a repeat A1c test is planned to assess current control.  Improvement in nasal congestion is noted with the use of Astelin nasal spray, prescribed for allergies and vasomotor rhinitis.  For insomnia, he has been using melatonin 3 mg at night and reports improved sleep quality.         Review of Systems See hpi    Objective:   Physical Exam  General Mental Status- Alert. General Appearance- Not in acute distress.   Skin General: Color- Normal Color. Moisture- Normal Moisture.  Neck Carotid Arteries- Normal color. Moisture- Normal Moisture. No carotid bruits. No JVD.  Chest and Lung Exam Auscultation: Breath Sounds:-Normal.  Cardiovascular Auscultation:Rythm- Regular. Murmurs & Other Heart Sounds:Auscultation of the heart reveals- No Murmurs.  Abdomen Inspection:-Inspeection Normal. Palpation/Percussion:Note:No mass. Palpation and Percussion of the abdomen reveal- Non Tender,  Non Distended + BS, no rebound or guarding.    Neurologic Cranial Nerve exam:- CN III-XII intact(No nystagmus), symmetric smile. Strength:- 5/5 equal and symmetric strength both upper and lower extremities.       Assessment & Plan:  Assessment and Plan    Thyroid Nodule Biopsy revealed atypia of undetermined significance in October 2024. -Encouraged to keep upcoming endocrinologist appointment for further evaluation.  Hypertension Improved blood pressure control with increased Losartan to 100mg  daily. Patient's home blood pressure monitor is unreliable. -Continue current regimen of Metoprolol, Clonidine, Cardizem, and Losartan 100mg  daily. -Purchase new blood pressure monitor and check blood pressure daily for a week. Update doctor with readings.  Type 2 Diabetes Mellitus Poorly controlled with elevated A1c of 7.8 in August 2024. Patient is on two oral medications and insulin. -Repeat A1c today. -Continue current diabetes medications. -Discuss A1c results with endocrinologist at upcoming appointment.  Nasal Congestion Improved with Astelin nasal spray. -Continue Astelin nasal spray as needed.  Insomnia Improved with Melatonin 3mg  at night. -Continue Melatonin 3mg  at night as needed.  Follow-up Await blood pressure readings and A1c results. Follow-up appointment to be determined based on these results.      Tyler Richters, PA-C

## 2023-05-24 NOTE — Patient Instructions (Signed)
Thyroid Nodule Biopsy revealed atypia of undetermined significance in October 2024. -Encouraged to keep upcoming endocrinologist appointment for further evaluation.   Hypertension Improved blood pressure control with increased Losartan to 100mg  daily. Patient's home blood pressure monitor is unreliable. -Continue current regimen of Metoprolol, Clonidine, Cardizem, and Losartan 100mg  daily. -Purchase new blood pressure monitor and check blood pressure daily for a week. Update doctor with readings.   Type 2 Diabetes Mellitus Poorly controlled with elevated A1c of 7.8 in August 2024. Patient is on two oral medications and insulin. -Repeat A1c today. -Continue current diabetes medications. -Discuss A1c results with endocrinologist at upcoming appointment.   Nasal Congestion Improved with Astelin nasal spray. -Continue Astelin nasal spray as needed.   Insomnia Improved with Melatonin 3mg  at night. -Continue Melatonin 3mg  at night as needed.   Follow-up Await blood pressure readings and A1c results. Follow-up appointment to be determined based on these results.

## 2023-05-25 ENCOUNTER — Encounter: Payer: Self-pay | Admitting: Medical

## 2023-05-25 NOTE — Addendum Note (Signed)
Addended by: Gwenevere Abbot on: 05/25/2023 08:23 AM   Modules accepted: Orders

## 2023-05-25 NOTE — Telephone Encounter (Signed)
Will you see new endocrinologist referral. Will you send that referral and call office on Monday. Hoping that office will see him for diabetes as well as for thyroid nodule.

## 2023-05-28 ENCOUNTER — Telehealth: Payer: Self-pay

## 2023-05-28 NOTE — Telephone Encounter (Signed)
 Left message on My Chart with normal lab results per Dr. Vanetta Shawl note. Routed to PCP.

## 2023-05-30 ENCOUNTER — Telehealth: Payer: Self-pay

## 2023-05-30 NOTE — Telephone Encounter (Signed)
 Lab Results reviewed with pt as per Dr. Vanetta Shawl note.  Pt verbalized understanding and had no additional questions. Routed to PCP

## 2023-05-31 DIAGNOSIS — R300 Dysuria: Secondary | ICD-10-CM | POA: Diagnosis not present

## 2023-05-31 DIAGNOSIS — Z794 Long term (current) use of insulin: Secondary | ICD-10-CM | POA: Diagnosis not present

## 2023-05-31 DIAGNOSIS — E042 Nontoxic multinodular goiter: Secondary | ICD-10-CM | POA: Diagnosis not present

## 2023-05-31 DIAGNOSIS — I10 Essential (primary) hypertension: Secondary | ICD-10-CM | POA: Diagnosis not present

## 2023-05-31 DIAGNOSIS — E1169 Type 2 diabetes mellitus with other specified complication: Secondary | ICD-10-CM | POA: Diagnosis not present

## 2023-05-31 DIAGNOSIS — E1165 Type 2 diabetes mellitus with hyperglycemia: Secondary | ICD-10-CM | POA: Diagnosis not present

## 2023-05-31 DIAGNOSIS — E785 Hyperlipidemia, unspecified: Secondary | ICD-10-CM | POA: Diagnosis not present

## 2023-06-02 ENCOUNTER — Other Ambulatory Visit: Payer: Self-pay | Admitting: Medical

## 2023-06-03 ENCOUNTER — Other Ambulatory Visit (HOSPITAL_BASED_OUTPATIENT_CLINIC_OR_DEPARTMENT_OTHER): Payer: Self-pay

## 2023-06-04 ENCOUNTER — Encounter: Payer: Self-pay | Admitting: Medical

## 2023-06-05 ENCOUNTER — Other Ambulatory Visit: Payer: Self-pay

## 2023-06-05 ENCOUNTER — Telehealth: Payer: Self-pay | Admitting: Cardiology

## 2023-06-05 NOTE — Telephone Encounter (Signed)
Patient called and said that they want him to have throat surgery but he was told that due to his heart he can't have anymore surgeries. Want to know Dr. Bing Matter says about it

## 2023-06-07 NOTE — Telephone Encounter (Signed)
Encouraged pt to have surgeon send a Surgical clearance form when he sees his surgeon in May. Pt agreed and verbalized understanding. He had no further questions.

## 2023-07-04 DIAGNOSIS — E049 Nontoxic goiter, unspecified: Secondary | ICD-10-CM | POA: Diagnosis not present

## 2023-07-05 ENCOUNTER — Telehealth: Payer: Self-pay

## 2023-07-05 NOTE — Telephone Encounter (Signed)
 Dr. Bing Matter,  Mr. Stonehocker is requesting preoperative cardiac evaluation for thyroidectomy.  Procedure is TBD.  He was recently seen by you in clinic on 05/21/2023.  He remained stable from a cardiac standpoint.  Would you be able to comment on cardiac risk for his upcoming procedure.  Please direct your response to CV DIV preop pool.  Thank you for your help.  Thomasene Ripple. Maxamillion Banas NP-C     07/05/2023, 4:43 PM [facility] [address] Office [phone] Fax [phone]

## 2023-07-05 NOTE — Telephone Encounter (Signed)
   Pre-operative Risk Assessment    Patient Name: Tyler Ellis.  DOB: [birth year] MRN: [id number]  Date of last office visit: 05/21/2023 Date of next office visit: N?A   Request for Surgical Clearance    Procedure:   Thyroidectomy  Date of Surgery:  Clearance TBD                                Surgeon:  Dr. Carollee Massed. Bertram Denver  Surgeon's Group or Practice Name:  [facility]  Phone number:  [phone] Fax number:  [phone]   Type of Clearance Requested:   - Medical    Type of Anesthesia:  General    Additional requests/questions:      Garnet Koyanagi Ferlando Lia   07/05/2023, 4:08 PM

## 2023-07-08 DIAGNOSIS — I700 Atherosclerosis of aorta: Secondary | ICD-10-CM | POA: Diagnosis not present

## 2023-07-08 DIAGNOSIS — M47812 Spondylosis without myelopathy or radiculopathy, cervical region: Secondary | ICD-10-CM | POA: Diagnosis not present

## 2023-07-08 DIAGNOSIS — I6523 Occlusion and stenosis of bilateral carotid arteries: Secondary | ICD-10-CM | POA: Diagnosis not present

## 2023-07-08 DIAGNOSIS — E049 Nontoxic goiter, unspecified: Secondary | ICD-10-CM | POA: Diagnosis not present

## 2023-07-09 ENCOUNTER — Other Ambulatory Visit: Payer: Self-pay | Admitting: Medical

## 2023-07-11 NOTE — Telephone Encounter (Signed)
 Patient with diagnosis of A Fib on Eliquis for anticoagulation.    Procedure: Thyroidectomy   Date of procedure: TBD   CHA2DS2-VASc Score = 4  This indicates a 4.8% annual risk of stroke. The patient's score is based upon: CHF History: 0 HTN History: 1 Diabetes History: 1 Stroke History: 0 Vascular Disease History: 1 Age Score: 1 Gender Score: 0    CrCl 114 ml/min Platelet count Not available   Per office protocol, patient can hold Eliquis for 2 days prior to procedure.    Patient will not need bridging with Lovenox (enoxaparin) around procedure. Please let surgeons office know there is no current CBC available  **This guidance is not considered finalized until pre-operative APP has relayed final recommendations.**

## 2023-07-11 NOTE — Telephone Encounter (Signed)
     Primary Cardiologist: Gypsy Balsam, MD  Chart reviewed as part of pre-operative protocol coverage. Given past medical history and time since last visit, based on ACC/AHA guidelines, Tyler SABADO Sr. would be at acceptable risk for the planned procedure without further cardiovascular testing.   His RCRI is moderate risk, 6.6% risk of major cardiac event.  Patient with diagnosis of A Fib on Eliquis for anticoagulation.     Procedure: Thyroidectomy   Date of procedure: TBD     CHA2DS2-VASc Score = 4  This indicates a 4.8% annual risk of stroke. The patient's score is based upon: CHF History: 0 HTN History: 1 Diabetes History: 1 Stroke History: 0 Vascular Disease History: 1 Age Score: 1 Gender Score: 0     CrCl 114 ml/min Platelet count Not available     Per office protocol, patient can hold Eliquis for 2 days prior to procedure.     Patient will not need bridging with Lovenox (enoxaparin) around procedure. Please let surgeons office know there is no current CBC available  I will route this recommendation to the requesting party via Epic fax function and remove from pre-op pool.  Please call with questions.  Tyler Ripple. Nyimah Shadduck NP-C     07/11/2023, 12:35 PM [facility] [address] Office [phone] Fax [phone]

## 2023-07-21 ENCOUNTER — Other Ambulatory Visit: Payer: Self-pay | Admitting: Cardiology

## 2023-07-21 ENCOUNTER — Other Ambulatory Visit: Payer: Self-pay | Admitting: Medical

## 2023-07-28 ENCOUNTER — Other Ambulatory Visit: Payer: Self-pay | Admitting: Medical

## 2023-08-15 DIAGNOSIS — E049 Nontoxic goiter, unspecified: Secondary | ICD-10-CM | POA: Diagnosis not present

## 2023-08-15 DIAGNOSIS — E041 Nontoxic single thyroid nodule: Secondary | ICD-10-CM | POA: Diagnosis not present

## 2023-08-15 DIAGNOSIS — E559 Vitamin D deficiency, unspecified: Secondary | ICD-10-CM | POA: Diagnosis not present

## 2023-08-22 HISTORY — PX: THYROIDECTOMY: SHX17

## 2023-08-23 DIAGNOSIS — I4891 Unspecified atrial fibrillation: Secondary | ICD-10-CM | POA: Diagnosis not present

## 2023-08-23 DIAGNOSIS — I2510 Atherosclerotic heart disease of native coronary artery without angina pectoris: Secondary | ICD-10-CM | POA: Diagnosis not present

## 2023-08-23 DIAGNOSIS — Z7901 Long term (current) use of anticoagulants: Secondary | ICD-10-CM | POA: Diagnosis not present

## 2023-08-23 DIAGNOSIS — E119 Type 2 diabetes mellitus without complications: Secondary | ICD-10-CM | POA: Diagnosis not present

## 2023-08-26 ENCOUNTER — Other Ambulatory Visit (HOSPITAL_BASED_OUTPATIENT_CLINIC_OR_DEPARTMENT_OTHER): Payer: Self-pay

## 2023-08-28 DIAGNOSIS — Z794 Long term (current) use of insulin: Secondary | ICD-10-CM | POA: Diagnosis not present

## 2023-08-28 DIAGNOSIS — I10 Essential (primary) hypertension: Secondary | ICD-10-CM | POA: Diagnosis not present

## 2023-08-28 DIAGNOSIS — E1169 Type 2 diabetes mellitus with other specified complication: Secondary | ICD-10-CM | POA: Diagnosis not present

## 2023-08-28 DIAGNOSIS — Z7984 Long term (current) use of oral hypoglycemic drugs: Secondary | ICD-10-CM | POA: Diagnosis not present

## 2023-08-28 DIAGNOSIS — E785 Hyperlipidemia, unspecified: Secondary | ICD-10-CM | POA: Diagnosis not present

## 2023-08-28 DIAGNOSIS — E1165 Type 2 diabetes mellitus with hyperglycemia: Secondary | ICD-10-CM | POA: Diagnosis not present

## 2023-08-28 DIAGNOSIS — E042 Nontoxic multinodular goiter: Secondary | ICD-10-CM | POA: Diagnosis not present

## 2023-08-30 DIAGNOSIS — I2510 Atherosclerotic heart disease of native coronary artery without angina pectoris: Secondary | ICD-10-CM | POA: Diagnosis not present

## 2023-08-30 DIAGNOSIS — E048 Other specified nontoxic goiter: Secondary | ICD-10-CM | POA: Diagnosis not present

## 2023-08-30 DIAGNOSIS — J810 Acute pulmonary edema: Secondary | ICD-10-CM | POA: Diagnosis not present

## 2023-08-30 DIAGNOSIS — Z794 Long term (current) use of insulin: Secondary | ICD-10-CM | POA: Diagnosis not present

## 2023-08-30 DIAGNOSIS — Z86718 Personal history of other venous thrombosis and embolism: Secondary | ICD-10-CM | POA: Diagnosis not present

## 2023-08-30 DIAGNOSIS — E041 Nontoxic single thyroid nodule: Secondary | ICD-10-CM | POA: Diagnosis not present

## 2023-08-30 DIAGNOSIS — I517 Cardiomegaly: Secondary | ICD-10-CM | POA: Diagnosis not present

## 2023-08-30 DIAGNOSIS — E079 Disorder of thyroid, unspecified: Secondary | ICD-10-CM | POA: Diagnosis not present

## 2023-08-30 DIAGNOSIS — I480 Paroxysmal atrial fibrillation: Secondary | ICD-10-CM | POA: Diagnosis not present

## 2023-08-30 DIAGNOSIS — C73 Malignant neoplasm of thyroid gland: Secondary | ICD-10-CM | POA: Diagnosis not present

## 2023-08-30 DIAGNOSIS — Z7984 Long term (current) use of oral hypoglycemic drugs: Secondary | ICD-10-CM | POA: Diagnosis not present

## 2023-08-30 DIAGNOSIS — Z7901 Long term (current) use of anticoagulants: Secondary | ICD-10-CM | POA: Diagnosis not present

## 2023-08-30 DIAGNOSIS — E049 Nontoxic goiter, unspecified: Secondary | ICD-10-CM | POA: Diagnosis not present

## 2023-08-30 DIAGNOSIS — I10 Essential (primary) hypertension: Secondary | ICD-10-CM | POA: Diagnosis not present

## 2023-08-30 DIAGNOSIS — E042 Nontoxic multinodular goiter: Secondary | ICD-10-CM | POA: Diagnosis not present

## 2023-08-30 DIAGNOSIS — Z79899 Other long term (current) drug therapy: Secondary | ICD-10-CM | POA: Diagnosis not present

## 2023-08-31 DIAGNOSIS — I517 Cardiomegaly: Secondary | ICD-10-CM | POA: Diagnosis not present

## 2023-08-31 DIAGNOSIS — E042 Nontoxic multinodular goiter: Secondary | ICD-10-CM | POA: Diagnosis not present

## 2023-08-31 DIAGNOSIS — I10 Essential (primary) hypertension: Secondary | ICD-10-CM | POA: Diagnosis not present

## 2023-08-31 DIAGNOSIS — Z7984 Long term (current) use of oral hypoglycemic drugs: Secondary | ICD-10-CM | POA: Diagnosis not present

## 2023-08-31 DIAGNOSIS — C73 Malignant neoplasm of thyroid gland: Secondary | ICD-10-CM | POA: Diagnosis not present

## 2023-08-31 DIAGNOSIS — Z86718 Personal history of other venous thrombosis and embolism: Secondary | ICD-10-CM | POA: Diagnosis not present

## 2023-08-31 DIAGNOSIS — I2510 Atherosclerotic heart disease of native coronary artery without angina pectoris: Secondary | ICD-10-CM | POA: Diagnosis not present

## 2023-08-31 DIAGNOSIS — I480 Paroxysmal atrial fibrillation: Secondary | ICD-10-CM | POA: Diagnosis not present

## 2023-08-31 DIAGNOSIS — Z794 Long term (current) use of insulin: Secondary | ICD-10-CM | POA: Diagnosis not present

## 2023-08-31 DIAGNOSIS — Z79899 Other long term (current) drug therapy: Secondary | ICD-10-CM | POA: Diagnosis not present

## 2023-08-31 DIAGNOSIS — Z7901 Long term (current) use of anticoagulants: Secondary | ICD-10-CM | POA: Diagnosis not present

## 2023-09-01 DIAGNOSIS — C73 Malignant neoplasm of thyroid gland: Secondary | ICD-10-CM | POA: Diagnosis not present

## 2023-09-01 DIAGNOSIS — Z794 Long term (current) use of insulin: Secondary | ICD-10-CM | POA: Diagnosis not present

## 2023-09-01 DIAGNOSIS — I480 Paroxysmal atrial fibrillation: Secondary | ICD-10-CM | POA: Diagnosis not present

## 2023-09-01 DIAGNOSIS — I2510 Atherosclerotic heart disease of native coronary artery without angina pectoris: Secondary | ICD-10-CM | POA: Diagnosis not present

## 2023-09-01 DIAGNOSIS — I4892 Unspecified atrial flutter: Secondary | ICD-10-CM | POA: Diagnosis not present

## 2023-09-01 DIAGNOSIS — I10 Essential (primary) hypertension: Secondary | ICD-10-CM | POA: Diagnosis not present

## 2023-09-01 DIAGNOSIS — E042 Nontoxic multinodular goiter: Secondary | ICD-10-CM | POA: Diagnosis not present

## 2023-09-01 DIAGNOSIS — Z7984 Long term (current) use of oral hypoglycemic drugs: Secondary | ICD-10-CM | POA: Diagnosis not present

## 2023-09-01 DIAGNOSIS — Z79899 Other long term (current) drug therapy: Secondary | ICD-10-CM | POA: Diagnosis not present

## 2023-09-01 DIAGNOSIS — Z7901 Long term (current) use of anticoagulants: Secondary | ICD-10-CM | POA: Diagnosis not present

## 2023-09-01 DIAGNOSIS — Z86718 Personal history of other venous thrombosis and embolism: Secondary | ICD-10-CM | POA: Diagnosis not present

## 2023-09-02 DIAGNOSIS — Z7984 Long term (current) use of oral hypoglycemic drugs: Secondary | ICD-10-CM | POA: Diagnosis not present

## 2023-09-02 DIAGNOSIS — Z7901 Long term (current) use of anticoagulants: Secondary | ICD-10-CM | POA: Diagnosis not present

## 2023-09-02 DIAGNOSIS — C73 Malignant neoplasm of thyroid gland: Secondary | ICD-10-CM | POA: Diagnosis not present

## 2023-09-02 DIAGNOSIS — I2510 Atherosclerotic heart disease of native coronary artery without angina pectoris: Secondary | ICD-10-CM | POA: Diagnosis not present

## 2023-09-02 DIAGNOSIS — I480 Paroxysmal atrial fibrillation: Secondary | ICD-10-CM | POA: Diagnosis not present

## 2023-09-02 DIAGNOSIS — Z794 Long term (current) use of insulin: Secondary | ICD-10-CM | POA: Diagnosis not present

## 2023-09-02 DIAGNOSIS — I10 Essential (primary) hypertension: Secondary | ICD-10-CM | POA: Diagnosis not present

## 2023-09-02 DIAGNOSIS — Z79899 Other long term (current) drug therapy: Secondary | ICD-10-CM | POA: Diagnosis not present

## 2023-09-02 DIAGNOSIS — E042 Nontoxic multinodular goiter: Secondary | ICD-10-CM | POA: Diagnosis not present

## 2023-09-02 DIAGNOSIS — Z86718 Personal history of other venous thrombosis and embolism: Secondary | ICD-10-CM | POA: Diagnosis not present

## 2023-09-04 ENCOUNTER — Telehealth: Payer: Self-pay | Admitting: Cardiology

## 2023-09-04 NOTE — Telephone Encounter (Signed)
 Pt c/o medication issue:  1. Name of Medication:   apixaban  (ELIQUIS ) 5 MG TABS tablet    2. How are you currently taking this medication (dosage and times per day)?  Take 1 tablet (5 mg total) by mouth 2 (two) times daily.      3. Are you having a reaction (difficulty breathing--STAT)? No  4. What is your medication issue? Dr. Ettie Hermanns is requesting a callback from nurse or MD regarding him doing pt's thyroid  surgery last week and he's wanting to know if its okay to hold off on starting the pt back up on this medication due to him still having some light bleeding and swelling in the area of incision. He was supposed to start back yesterday but he'd like to hold out a few more days. Please advise

## 2023-10-08 DIAGNOSIS — C73 Malignant neoplasm of thyroid gland: Secondary | ICD-10-CM | POA: Diagnosis not present

## 2023-10-08 DIAGNOSIS — Z9889 Other specified postprocedural states: Secondary | ICD-10-CM | POA: Diagnosis not present

## 2023-10-08 DIAGNOSIS — Z9089 Acquired absence of other organs: Secondary | ICD-10-CM | POA: Diagnosis not present

## 2023-10-12 ENCOUNTER — Other Ambulatory Visit: Payer: Self-pay | Admitting: Cardiology

## 2023-10-21 ENCOUNTER — Other Ambulatory Visit: Payer: Self-pay | Admitting: Medical

## 2023-11-04 ENCOUNTER — Telehealth: Payer: Self-pay | Admitting: Medical

## 2023-11-04 ENCOUNTER — Other Ambulatory Visit: Payer: Self-pay | Admitting: Medical

## 2023-11-04 NOTE — Telephone Encounter (Signed)
 Reviewed rx refill request. Sent note to staff

## 2023-11-04 NOTE — Telephone Encounter (Signed)
 [facility]  [address]  [address]  [place], [place] [address]  [phone]  Trudy Milroy [place], [place]  [address] [place], [place] [address]  [phone] (Work)  [phone] (Fax)      I got refill request to fill his lantus/insuline he is now seeing endocrinologist and just saw them in May, 2025. Will you call that office and ask if they will refill his insulin . Best if they fill the prescription.

## 2023-11-04 NOTE — Telephone Encounter (Signed)
 Called patient and no answer; left voicemail.

## 2023-11-04 NOTE — Telephone Encounter (Signed)
 Patient called and made aware that he needs to speak to endo for refills of his lantus Patient was understanding

## 2023-11-13 MED ORDER — APIXABAN 5 MG PO TABS: 5.0000 mg | ORAL_TABLET | Freq: Two times a day (BID) | ORAL | 11 refills | Status: DC

## 2023-11-13 NOTE — Addendum Note (Signed)
 Addended by: DORINA DALLAS HERO on: 11/13/2023 07:36 AM   Modules accepted: Orders

## 2023-12-03 ENCOUNTER — Other Ambulatory Visit (HOSPITAL_BASED_OUTPATIENT_CLINIC_OR_DEPARTMENT_OTHER): Payer: Self-pay

## 2023-12-03 ENCOUNTER — Other Ambulatory Visit: Payer: Self-pay

## 2023-12-03 ENCOUNTER — Other Ambulatory Visit: Payer: Self-pay | Admitting: Cardiology

## 2023-12-03 MED ORDER — DILTIAZEM HCL ER COATED BEADS 360 MG PO CP24
360.0000 mg | ORAL_CAPSULE | Freq: Every day | ORAL | 1 refills | Status: AC
  Filled 2023-12-03: qty 90, 90d supply, fill #0
  Filled 2024-02-29: qty 90, 90d supply, fill #1

## 2023-12-05 ENCOUNTER — Other Ambulatory Visit (HOSPITAL_BASED_OUTPATIENT_CLINIC_OR_DEPARTMENT_OTHER): Payer: Self-pay

## 2024-01-01 DIAGNOSIS — Z794 Long term (current) use of insulin: Secondary | ICD-10-CM | POA: Diagnosis not present

## 2024-01-01 DIAGNOSIS — E1169 Type 2 diabetes mellitus with other specified complication: Secondary | ICD-10-CM | POA: Diagnosis not present

## 2024-01-01 DIAGNOSIS — Z85850 Personal history of malignant neoplasm of thyroid: Secondary | ICD-10-CM | POA: Diagnosis not present

## 2024-01-01 DIAGNOSIS — E1165 Type 2 diabetes mellitus with hyperglycemia: Secondary | ICD-10-CM | POA: Diagnosis not present

## 2024-01-01 DIAGNOSIS — E890 Postprocedural hypothyroidism: Secondary | ICD-10-CM | POA: Diagnosis not present

## 2024-01-01 DIAGNOSIS — R5383 Other fatigue: Secondary | ICD-10-CM | POA: Diagnosis not present

## 2024-01-01 DIAGNOSIS — I10 Essential (primary) hypertension: Secondary | ICD-10-CM | POA: Diagnosis not present

## 2024-01-01 DIAGNOSIS — E785 Hyperlipidemia, unspecified: Secondary | ICD-10-CM | POA: Diagnosis not present

## 2024-01-07 NOTE — Telephone Encounter (Signed)
 Patient is returning to call in regards to labs. Please advise.

## 2024-01-10 ENCOUNTER — Telehealth: Payer: Self-pay

## 2024-01-10 NOTE — Telephone Encounter (Signed)
 Called pt to notify him that [place] has but in an order for pts yearly thyroid  ultrasound  He was understanding and says he will call and schedule today

## 2024-01-10 NOTE — Addendum Note (Signed)
 Addended by: DORINA DALLAS HERO on: 01/10/2024 11:46 AM   Modules accepted: Orders

## 2024-01-17 ENCOUNTER — Other Ambulatory Visit: Payer: Self-pay | Admitting: Cardiology

## 2024-01-17 ENCOUNTER — Other Ambulatory Visit: Payer: Self-pay | Admitting: Medical

## 2024-01-22 ENCOUNTER — Ambulatory Visit (HOSPITAL_BASED_OUTPATIENT_CLINIC_OR_DEPARTMENT_OTHER)
Admission: RE | Admit: 2024-01-22 | Discharge: 2024-01-22 | Disposition: A | Discharge status: Home / Self Care | Source: Ambulatory Visit | Attending: Medical | Admitting: Medical

## 2024-01-22 ENCOUNTER — Other Ambulatory Visit (HOSPITAL_BASED_OUTPATIENT_CLINIC_OR_DEPARTMENT_OTHER): Payer: Self-pay

## 2024-01-22 ENCOUNTER — Telehealth: Payer: Self-pay

## 2024-01-22 DIAGNOSIS — E041 Nontoxic single thyroid nodule: Secondary | ICD-10-CM | POA: Insufficient documentation

## 2024-01-22 DIAGNOSIS — C73 Malignant neoplasm of thyroid gland: Secondary | ICD-10-CM | POA: Diagnosis not present

## 2024-01-22 MED ORDER — FLUZONE HIGH-DOSE 0.5 ML IM SUSY
0.5000 mL | PREFILLED_SYRINGE | Freq: Once | INTRAMUSCULAR | 0 refills | Status: AC
  Filled 2024-01-22: qty 0.5, 1d supply, fill #0

## 2024-01-22 MED ORDER — COMIRNATY 30 MCG/0.3ML IM SUSY
0.3000 mL | PREFILLED_SYRINGE | Freq: Once | INTRAMUSCULAR | 0 refills | Status: AC
  Filled 2024-01-22: qty 0.3, 1d supply, fill #0

## 2024-01-22 NOTE — Telephone Encounter (Signed)
 Spoke with u.s tech says that she has to change the order from nodule follow up to vasectomy due to his thyroid  being removed may 9th. Also pt states that he is scheduled to have the same us  with his other doctor in November. Tech said that she will send the images to [facility] well as [facility] so he does not have to do the same thing twice.

## 2024-01-24 ENCOUNTER — Ambulatory Visit: Payer: Self-pay | Admitting: Family

## 2024-01-27 ENCOUNTER — Telehealth: Payer: Self-pay | Admitting: Medical

## 2024-01-27 NOTE — Telephone Encounter (Signed)
 Pt dropped off form to completed by PCP fo dental release. Pt asks that form be faxed and pt be called when form is faxed. Placed form in providers box in front office.

## 2024-01-27 NOTE — Telephone Encounter (Signed)
 Received forms placed in yellow folder

## 2024-01-30 ENCOUNTER — Telehealth: Payer: Self-pay | Admitting: Cardiology

## 2024-01-30 ENCOUNTER — Other Ambulatory Visit: Payer: Self-pay

## 2024-01-30 MED ORDER — EZETIMIBE 10 MG PO TABS: 10.0000 mg | ORAL_TABLET | Freq: Every day | ORAL | 0 refills | Status: DC

## 2024-01-30 NOTE — Telephone Encounter (Signed)
 Pts home health nurse calling in regard to swelling and SOB in pt.   Pt c/o swelling/edema: STAT if pt has developed SOB within 24 hours  If swelling, where is the swelling located? Lower legs, ankles,feet   How much weight have you gained and in what time span? N/a  Have you gained 2 pounds in a day or 5 pounds in a week? N/a  Do you have a log of your daily weights (if so, list)? No   Are you currently taking a fluid pill? No   Are you currently SOB? Yes. Pt denies being SOB but home health nurse says he sounds SOB.   Have you traveled recently in a car or plane for an extended period of time? No     Home health nurse also states pt does not know if he should be taking   torsemide  (DEMADEX ) 20 MG tablet     Pt c/o medication issue:  1. Name of Medication:   torsemide  (DEMADEX ) 20 MG tablet    2. How are you currently taking this medication (dosage and times per day)? None   3. Are you having a reaction (difficulty breathing--STAT)? No  4. What is your medication issue? Pt would like to know if he should be taking this medication

## 2024-01-30 NOTE — Telephone Encounter (Signed)
*  STAT* If patient is at the pharmacy, call can be transferred to refill team.   1. Which medications need to be refilled? (please list name of each medication and dose if known) ezetimibe  (ZETIA ) 10 MG tablet    2. Would you like to learn more about the convenience, safety, & potential cost savings by using the [facility]? No    3. Are you open to using the [facility] (Type [facility]. No   4. Which pharmacy/location (including street and city if local pharmacy) is medication to be sent to?[id number] - [place], [place] - [address] AT [facility] [address]    5. Do they need a 30 day or 90 day supply? 90 day

## 2024-01-30 NOTE — Telephone Encounter (Signed)
 Home health nurse was very concerned and wanted pt to be seen. She thinks his swelling  is +1 pretibial, 9 nonpitting in the feet. Scheduled him with DOROTHA Hoover in [place] 10/10.

## 2024-01-30 NOTE — Telephone Encounter (Signed)
 Pt's medication was sent to pt's pharmacy as requested. Confirmation received.

## 2024-01-31 ENCOUNTER — Ambulatory Visit: Attending: Cardiology | Admitting: Cardiology

## 2024-01-31 ENCOUNTER — Encounter: Payer: Self-pay | Admitting: Cardiology

## 2024-01-31 VITALS — BP 140/80 | HR 90 | Ht 67.0 in | Wt 248.0 lb

## 2024-01-31 DIAGNOSIS — D6859 Other primary thrombophilia: Secondary | ICD-10-CM

## 2024-01-31 DIAGNOSIS — Z79899 Other long term (current) drug therapy: Secondary | ICD-10-CM | POA: Diagnosis not present

## 2024-01-31 DIAGNOSIS — I2510 Atherosclerotic heart disease of native coronary artery without angina pectoris: Secondary | ICD-10-CM

## 2024-01-31 DIAGNOSIS — E785 Hyperlipidemia, unspecified: Secondary | ICD-10-CM

## 2024-01-31 DIAGNOSIS — R011 Cardiac murmur, unspecified: Secondary | ICD-10-CM | POA: Diagnosis not present

## 2024-01-31 DIAGNOSIS — I480 Paroxysmal atrial fibrillation: Secondary | ICD-10-CM | POA: Diagnosis not present

## 2024-01-31 DIAGNOSIS — R0609 Other forms of dyspnea: Secondary | ICD-10-CM

## 2024-01-31 DIAGNOSIS — I5189 Other ill-defined heart diseases: Secondary | ICD-10-CM | POA: Diagnosis not present

## 2024-01-31 MED ORDER — APIXABAN 5 MG PO TABS: 5.0000 mg | ORAL_TABLET | Freq: Two times a day (BID) | ORAL | Status: AC

## 2024-01-31 NOTE — Patient Instructions (Signed)
 Medication Instructions:  Your physician recommends that you continue on your current medications as directed. Please refer to the Current Medication list given to you today.  Take Melatonin 1 mg daily as needed for sleep  *If you need a refill on your cardiac medications before your next appointment, please call your pharmacy*   Lab Work: Your physician recommends that you have a CMP, CBC, ProBNP, flecainide  level and direct LDL today in the office.  If you have labs (blood work) drawn today and your tests are completely normal, you will receive your results only by: MyChart Message (if you have MyChart) OR A paper copy in the mail If you have any lab test that is abnormal or we need to change your treatment, we will call you to review the results.  Testing/Procedures: Your physician has requested that you have an echocardiogram. Echocardiography is a painless test that uses sound waves to create images of your heart. It provides your doctor with information about the size and shape of your heart and how well your heart's chambers and valves are working. This procedure takes approximately one hour. There are no restrictions for this procedure. Please do NOT wear cologne, perfume, aftershave, or lotions (deodorant is allowed). Please arrive 15 minutes prior to your appointment time.  Please note: We ask at that you not bring children with you during ultrasound (echo/ vascular) testing. Due to room size and safety concerns, children are not allowed in the ultrasound rooms during exams. Our front office staff cannot provide observation of children in our lobby area while testing is being conducted. An adult accompanying a patient to their appointment will only be allowed in the ultrasound room at the discretion of the ultrasound technician under special circumstances. We apologize for any inconvenience.  Follow-Up: At [facility], you and your health needs are our priority.  As part of our  continuing mission to provide you with exceptional heart care, we have created designated Provider Care Teams.  These Care Teams include your primary Cardiologist (physician) and Advanced Practice Providers (APPs -  Physician Assistants and Nurse Practitioners) who all work together to provide you with the care you need, when you need it.  We recommend signing up for the patient portal called MyChart.  Sign up information is provided on this After Visit Summary.  MyChart is used to connect with patients for Virtual Visits (Telemedicine).  Patients are able to view lab/test results, encounter notes, upcoming appointments, etc.  Non-urgent messages can be sent to your provider as well.   To learn more about what you can do with MyChart, go to [email or web address].    Your next appointment:   3 month(s)  The format for your next appointment:   In Person  Provider:   Lamar Fitch, MD   Other Instructions Echocardiogram An echocardiogram is a test that uses sound waves (ultrasound) to produce images of the heart. Images from an echocardiogram can provide important information about: Heart size and shape. The size and thickness and movement of your heart's walls. Heart muscle function and strength. Heart valve function or if you have stenosis. Stenosis is when the heart valves are too narrow. If blood is flowing backward through the heart valves (regurgitation). A tumor or infectious growth around the heart valves. Areas of heart muscle that are not working well because of poor blood flow or injury from a heart attack. Aneurysm detection. An aneurysm is a weak or damaged part of an artery wall. The wall bulges out  from the normal force of blood pumping through the body. Tell a health care provider about: Any allergies you have. All medicines you are taking, including vitamins, herbs, eye drops, creams, and over-the-counter medicines. Any blood disorders you have. Any surgeries you  have had. Any medical conditions you have. Whether you are pregnant or may be pregnant. What are the risks? Generally, this is a safe test. However, problems may occur, including an allergic reaction to dye (contrast) that may be used during the test. What happens before the test? No specific preparation is needed. You may eat and drink normally. What happens during the test? You will take off your clothes from the waist up and put on a hospital gown. Electrodes or electrocardiogram (ECG)patches may be placed on your chest. The electrodes or patches are then connected to a device that monitors your heart rate and rhythm. You will lie down on a table for an ultrasound exam. A gel will be applied to your chest to help sound waves pass through your skin. A handheld device, called a transducer, will be pressed against your chest and moved over your heart. The transducer produces sound waves that travel to your heart and bounce back (or echo back) to the transducer. These sound waves will be captured in real-time and changed into images of your heart that can be viewed on a video monitor. The images will be recorded on a computer and reviewed by your health care provider. You may be asked to change positions or hold your breath for a short time. This makes it easier to get different views or better views of your heart. In some cases, you may receive contrast through an IV in one of your veins. This can improve the quality of the pictures from your heart. The procedure may vary among health care providers and hospitals.   What can I expect after the test? You may return to your normal, everyday life, including diet, activities, and medicines, unless your health care provider tells you not to do that. Follow these instructions at home: It is up to you to get the results of your test. Ask your health care provider, or the department that is doing the test, when your results will be ready. Keep all  follow-up visits. This is important. Summary An echocardiogram is a test that uses sound waves (ultrasound) to produce images of the heart. Images from an echocardiogram can provide important information about the size and shape of your heart, heart muscle function, heart valve function, and other possible heart problems. You do not need to do anything to prepare before this test. You may eat and drink normally. After the echocardiogram is completed, you may return to your normal, everyday life, unless your health care provider tells you not to do that. This information is not intended to replace advice given to you by your health care provider. Make sure you discuss any questions you have with your health care provider. Document Revised: 12/01/2019 Document Reviewed: 12/01/2019 Elsevier Patient Education  2021 Elsevier Inc.   Important Information About Sugar

## 2024-01-31 NOTE — Progress Notes (Signed)
 Cardiology Office Note   Date:  01/31/2024  ID:  Tyler LITTIE Lesches Sr., DOB [birth year], MRN [id number] PCP: Dorina Dallas RIGGERS  [facility] Providers Cardiologist:  Lamar Fitch, MD Electrophysiologist:  Soyla Gladis [facility], MD     History of Present Illness Tyler CLINE Sr. is a 73 y.o. male With a past medical history of CAD, hypertension, paroxysmal atrial fibrillation, SVT, DM2, hyperthyroidism, BPH, erectile dysfunction.  01/17/2022 monitor infrequent PVCs and APCs, average heart rate 92 bpm 01/04/2022 echo EF 60 to 65%, grade 2 DD, LA moderately dilated, aortic valve sclerosis present without stenosis 06/17/2020 echo EF 60 to 65%, grade 1 DD 05/24/2020 monitor average heart rate 91 bpm, 5 episodes of SVT >> referred to EP 02/28/2017 left heart cath mild nonobstructive CAD  He initially established care with Dr. Fitch in November 2018, following an episode where he had woken up in the middle of the night with chest pain and diaphoresis presented to an urgent care and then ultimately transferred to [facility] undergoing left heart cath revealing mild nonobstructive CAD.  Around 2022 it appears he was undergoing a urological procedure and was noted to have narrow complex tachycardia however he was told he was in atrial fibrillation and was started on flecainide , he wore a monitor revealing few occurrences of atrial fibrillation as well as episodes of SVT.  He was evaluated by Dr. Norton for this, decision was made to continue with medical therapy at this time as opposed to ablation.  Repeat  monitor in 2023 revealing infrequent PVCs and APCs.  Most recently he was evaluated by Dr. Fitch on 05/21/2023, doing well overall, his blood pressure was elevated and it appeared to be elevated at multiple office visits, he was maintaining sinus rhythm and advised to follow-up in 6 months.  He presents today for follow-up, he apparently was recently evaluated by nurse  practitioner in his home, he had some complaints of shortness of breath and leg swelling and it was suggested he follow-up with his cardiologist.  Regarding his shortness of breath, he says he notices it mainly with exertion, mentions walking up a hill to get into our office today and felt short of breath.  He does have trace pedal edema however he reports it is much better than it has been in the past. He denies chest pain, palpitations, pnd, orthopnea, n, v, dizziness, syncope, weight gain, or early satiety.   ROS: Review of Systems  Respiratory:  Positive for shortness of breath.   Cardiovascular:  Positive for leg swelling.  Musculoskeletal:  Positive for joint pain.  All other systems reviewed and are negative.    Studies Reviewed EKG Interpretation Date/Time:  Friday January 31 2024 10:49:07 EDT Ventricular Rate:  90 PR Interval:  168 QRS Duration:  76 QT Interval:  368 QTC Calculation: 450 R Axis:   -13  Text Interpretation: Normal sinus rhythm Normal ECG When compared with ECG of 21-May-2023 09:40, No significant change was found Confirmed by Carlin Nest [phone]) on 01/31/2024 10:51:26 AM    Cardiac Studies & Procedures   ______________________________________________________________________________________________ CARDIAC CATHETERIZATION  CARDIAC CATHETERIZATION 02/28/2017  Conclusion  Prox RCA lesion is 10% stenosed.  Mid RCA lesion is 10% stenosed.  The left ventricular systolic function is normal.  LV end diastolic pressure is normal.  The left ventricular ejection fraction is greater than 65% by visual estimate.  There is mild (2+) mitral regurgitation.  1. Mild non-obstructive CAD 2. Normal LV systolic function with normal filling  pressures  Recommendations: No further ischemic evaluation.  Findings Coronary Findings Diagnostic  Dominance: Right  Left Anterior Descending Vessel is large.  First Diagonal Branch Vessel is moderate in  size.  Second Diagonal Branch Vessel is small in size.  Third Diagonal Branch Vessel is small in size.  Left Circumflex  First Obtuse Marginal Branch Vessel is large in size.  Second Obtuse Marginal Branch Vessel is moderate in size.  Third Obtuse Marginal Branch Vessel is small in size.  Right Coronary Artery Prox RCA lesion is 10% stenosed. Mid RCA lesion is 10% stenosed.  Intervention  No interventions have been documented.   STRESS TESTS  MYOCARDIAL PERFUSION IMAGING 03/03/2018  Interpretation Summary  Nuclear stress EF: 69%.  There was no ST segment deviation noted during stress.  No T wave inversion was noted during stress.  This is a low risk study.  Normal perfusion. LVEF 69% with normal wall motion. This is a low risk study.   ECHOCARDIOGRAM  ECHOCARDIOGRAM COMPLETE 01/04/2022  Narrative ECHOCARDIOGRAM REPORT    Patient Name:   Tyler Ellis Date of Exam: 01/04/2022 Medical Rec #:  [id number]    Height:       67.0 in Accession #:    [id number]   Weight:       251.0 lb Date of Birth:  03-11-1951    BSA:          2.227 m Patient Age:    71 years     BP:           129/76 mmHg Patient Gender: M            HR:           80 bpm. Exam Location:  [place]  Procedure: 2D Echo, Cardiac Doppler, Color Doppler and Strain Analysis  Indications:    Dyspnea  History:        Patient has prior history of Echocardiogram examinations, most recent 06/17/2020. CAD, Arrythmias:Tachycardia and Atrial Fibrillation; Risk Factors:Hypertension, Diabetes and Dyslipidemia.  Sonographer:    Vernell Hammersmith RDCS Referring Phys: [id number] ROBERT J KRASOWSKI  IMPRESSIONS   1. Left ventricular ejection fraction, by estimation, is 60 to 65%. The left ventricle has normal function. The left ventricle has no regional wall motion abnormalities. Left ventricular diastolic parameters are consistent with Grade II diastolic dysfunction (pseudonormalization). 2. Right ventricular  systolic function is normal. The right ventricular size is normal. There is normal pulmonary artery systolic pressure. 3. Left atrial size was moderately dilated. 4. The mitral valve is normal in structure. No evidence of mitral valve regurgitation. No evidence of mitral stenosis. 5. The aortic valve is normal in structure. Aortic valve regurgitation is not visualized. Aortic valve sclerosis is present, with no evidence of aortic valve stenosis. 6. The inferior vena cava is normal in size with greater than 50% respiratory variability, suggesting right atrial pressure of 3 mmHg.  FINDINGS Left Ventricle: Left ventricular ejection fraction, by estimation, is 60 to 65%. The left ventricle has normal function. The left ventricle has no regional wall motion abnormalities. The left ventricular internal cavity size was normal in size. There is borderline left ventricular hypertrophy. Left ventricular diastolic parameters are consistent with Grade II diastolic dysfunction (pseudonormalization).  Right Ventricle: The right ventricular size is normal. No increase in right ventricular wall thickness. Right ventricular systolic function is normal. There is normal pulmonary artery systolic pressure. The tricuspid regurgitant velocity is 2.45 m/s, and with an assumed right atrial pressure of 3 mmHg, the  estimated right ventricular systolic pressure is 27.0 mmHg.  Left Atrium: Left atrial size was moderately dilated.  Right Atrium: Right atrial size was normal in size.  Pericardium: There is no evidence of pericardial effusion.  Mitral Valve: The mitral valve is normal in structure. No evidence of mitral valve regurgitation. No evidence of mitral valve stenosis.  Tricuspid Valve: The tricuspid valve is normal in structure. Tricuspid valve regurgitation is mild . No evidence of tricuspid stenosis.  Aortic Valve: The aortic valve is normal in structure. Aortic valve regurgitation is not visualized. Aortic  valve sclerosis is present, with no evidence of aortic valve stenosis. Aortic valve mean gradient measures 8.0 mmHg. Aortic valve peak gradient measures 13.7 mmHg. Aortic valve area, by VTI measures 2.12 cm.  Pulmonic Valve: The pulmonic valve was normal in structure. Pulmonic valve regurgitation is not visualized. No evidence of pulmonic stenosis.  Aorta: The aortic root is normal in size and structure.  Venous: The inferior vena cava is normal in size with greater than 50% respiratory variability, suggesting right atrial pressure of 3 mmHg.  IAS/Shunts: No atrial level shunt detected by color flow Doppler.   LEFT VENTRICLE PLAX 2D LVIDd:         4.10 cm   Diastology LVIDs:         2.50 cm   LV e' medial:    5.87 cm/s LV PW:         1.30 cm   LV E/e' medial:  18.9 LV IVS:        1.80 cm   LV e' lateral:   10.00 cm/s LVOT diam:     2.00 cm   LV E/e' lateral: 11.1 LV SV:         74 LV SV Index:   33 LVOT Area:     3.14 cm   RIGHT VENTRICLE RV Basal diam:  4.10 cm RV Mid diam:    3.60 cm RV S prime:     12.20 cm/s TAPSE (M-mode): 2.8 cm  LEFT ATRIUM             Index        RIGHT ATRIUM           Index LA diam:        3.90 cm 1.75 cm/m   RA Area:     19.10 cm LA Vol (A2C):   55.2 ml 24.78 ml/m  RA Volume:   58.70 ml  26.36 ml/m LA Vol (A4C):   86.8 ml 38.97 ml/m LA Biplane Vol: 70.7 ml 31.74 ml/m AORTIC VALVE AV Area (Vmax):    2.14 cm AV Area (Vmean):   2.07 cm AV Area (VTI):     2.12 cm AV Vmax:           185.00 cm/s AV Vmean:          130.000 cm/s AV VTI:            0.349 m AV Peak Grad:      13.7 mmHg AV Mean Grad:      8.0 mmHg LVOT Vmax:         126.00 cm/s LVOT Vmean:        85.600 cm/s LVOT VTI:          0.235 m LVOT/AV VTI ratio: 0.67  AORTA Ao Root diam: 3.20 cm Ao Asc diam:  3.50 cm Ao Arch diam: 3.3 cm  MITRAL VALVE  TRICUSPID VALVE MV Area (PHT): 4.29 cm     TR Peak grad:   24.0 mmHg MV Decel Time: 177 msec     TR Vmax:         245.00 cm/s MV E velocity: 111.00 cm/s MV A velocity: 96.90 cm/s   SHUNTS MV E/A ratio:  1.15         Systemic VTI:  0.24 m Systemic Diam: 2.00 cm  Johanny Segers Crape MD Electronically signed by Timica Marcom Crape MD Signature Date/Time: 01/05/2022/12:53:40 PM    Final    MONITORS  LONG TERM MONITOR (3-14 DAYS) 01/17/2022  Narrative Patch Wear Time:  3 days and 22 hours (2023-09-07T09:04:42-0400 to 2023-09-11T07:21:55-0400)  Patient had a min HR of 66 bpm, max HR of 131 bpm, and avg HR of 92 bpm. Predominant underlying rhythm was Sinus Rhythm. Isolated SVEs were rare (<1.0%), SVE Couplets were rare (<1.0%), and SVE Triplets were rare (<1.0%). Isolated VEs were rare (<1.0%, 24), VE Triplets were rare (<1.0%, 1), and no VE Couplets were present.  Summary conclusions: Infrequent PVCs and APCs noted otherwise normal monitor. Triggered event showing sinus rhythm       ______________________________________________________________________________________________      Risk Assessment/Calculations  CHA2DS2-VASc Score = 4   This indicates a 4.8% annual risk of stroke. The patient's score is based upon: CHF History: 0 HTN History: 1 Diabetes History: 1 Stroke History: 0 Vascular Disease History: 1 Age Score: 1 Gender Score: 0    HYPERTENSION CONTROL Vitals:   01/31/24 1031 01/31/24 1125  BP: (!) 140/80 (!) 140/80    The patient's blood pressure is elevated above target today.  In order to address the patient's elevated BP: Blood pressure will be monitored at home to determine if medication changes need to be made.; Labs and/or other diagnostics are currently pending prior to making blood pressure medication adjustments.          Physical Exam VS:  BP (!) 140/80   Pulse 90   Ht 5' 7 (1.702 m)   Wt 248 lb (112.5 kg)   SpO2 98%   BMI 38.84 kg/m        Wt Readings from Last 3 Encounters:  01/31/24 248 lb (112.5 kg)  05/24/23 255 lb 3.2 oz (115.8 kg)  05/21/23 252  lb (114.3 kg)    GEN: Well nourished, well developed in no acute distress NECK: No JVD; No carotid bruits CARDIAC: RRR, 2/6 systolic murmur,  rubs, gallops RESPIRATORY:  Clear to auscultation without rales, wheezing or rhonchi  ABDOMEN: Soft, but distended EXTREMITIES:  +1 pitting edema; No deformity   ASSESSMENT AND PLAN Paroxysmal atrial fibrillation/hypercoagulable state/on high risk medication use-he is maintaining sinus rhythm, his QRS and PR interval are unchanged.  Will check flecainide  level as it rarely has a side effect of pedal edema.  Continue Eliquis  5 mg twice daily--check CBC and CMET--will provide him with samples as he states he cannot afford it and will also LP(a) paperwork.  Continue metoprolol  200 mg daily.  Diastolic dysfunction with DOE-grade 2 on most recent echo, will repeat echocardiogram to check proBNP although he appears to be euvolemic with some mild pedal edema.  Was previously on Demadex  however he ran out of this and stated that his edema actually got better.  Currently on Jardiance 10 mg daily, metoprolol  200 mg daily.  Will check proBNP  Murmur-2/6 best heard on right sternal border, repeat echocardiogram.  Mild nonobstructive CAD- Stable with no anginal symptoms. No indication for ischemic evaluation.  Continue metoprolol  200 mg daily, nitroglycerin  as needed, Zetia  10 mg daily, Lipitor  80 mg daily, he is on Eliquis  therefore he is not on aspirin .  SVT-currently quiescent, continue metoprolol  20 mg daily.  Hypertension-slightly elevated today at 140/80, will check lab work and have him monitor at home, continue Cardizem  360 mg daily, metoprolol  200 mg daily, losartan  50 mg daily, if it remains elevated we can increase his losartan .  Dyslipidemia-will check c-Met direct LDL, currently on Lipitor  80 mg daily and Zetia  10 mg daily, prefer his LDL to be less than 70.       Dispo: Provide Eliquis  samples, PA paperwork for Eliquis , echocardiogram, CBC, c-Met,  direct LDL, flecainide  level, proBNP. 3 months.   Signed, Delon JAYSON Hoover, NP

## 2024-02-05 ENCOUNTER — Ambulatory Visit: Payer: Self-pay | Admitting: Cardiology

## 2024-02-05 LAB — CBC
Hematocrit: 48.5 % (ref 37.5–51.0)
Hemoglobin: 16.1 g/dL (ref 13.0–17.7)
MCH: 31.6 pg (ref 26.6–33.0)
MCHC: 33.2 g/dL (ref 31.5–35.7)
MCV: 95 fL (ref 79–97)
Platelets: 252 x10E3/uL (ref 150–450)
RBC: 5.1 x10E6/uL (ref 4.14–5.80)
RDW: 12.6 % (ref 11.6–15.4)
WBC: 8.3 x10E3/uL (ref 3.4–10.8)

## 2024-02-05 LAB — FLECAINIDE LEVEL: Flecainide: 0.09 ug/mL — ABNORMAL LOW (ref 0.20–1.00)

## 2024-02-05 LAB — COMPREHENSIVE METABOLIC PANEL WITH GFR
ALT: 13 IU/L (ref 0–44)
AST: 12 IU/L (ref 0–40)
Albumin: 4.5 g/dL (ref 3.8–4.8)
Alkaline Phosphatase: 108 IU/L (ref 47–123)
BUN/Creatinine Ratio: 10 (ref 10–24)
BUN: 11 mg/dL (ref 8–27)
Bilirubin Total: 0.5 mg/dL (ref 0.0–1.2)
CO2: 20 mmol/L (ref 20–29)
Calcium: 10 mg/dL (ref 8.6–10.2)
Chloride: 102 mmol/L (ref 96–106)
Creatinine, Ser: 1.09 mg/dL (ref 0.76–1.27)
Globulin, Total: 2.4 g/dL (ref 1.5–4.5)
Glucose: 93 mg/dL (ref 70–99)
Potassium: 4.3 mmol/L (ref 3.5–5.2)
Sodium: 140 mmol/L (ref 134–144)
Total Protein: 6.9 g/dL (ref 6.0–8.5)
eGFR: 72 mL/min/1.73

## 2024-02-05 LAB — PRO B NATRIURETIC PEPTIDE: NT-Pro BNP: 177 pg/mL (ref 0–376)

## 2024-02-05 LAB — LDL CHOLESTEROL, DIRECT: LDL Direct: 86 mg/dL (ref 0–99)

## 2024-02-05 NOTE — Telephone Encounter (Signed)
 Left vm to return call.

## 2024-02-05 NOTE — Telephone Encounter (Signed)
-----   Message from Delon JAYSON Hoover sent at 02/05/2024 12:37 PM EDT ----- Flecainide  level normal. Kidney and electrolytes normal. CBC without anemia or infection.  No evidence of heart failure.  LDL slightly elevated beyond what we would like, but he is on max dose Lipitor  and Zetia .  I recommend he work on lifestyle changes, try to lose 10-15 lbs, eat a Mediterranean Diet -- lean meats, fresh fruit and veggies and see if we can get cholesterol controlled with lifestyle changes as  opposed to adding a third medication.  ----- Message ----- From: Interface, [facility] Lab Results In Sent: 02/05/2024  11:36 AM EDT To: Delon JAYSON Hoover, NP

## 2024-02-10 MED ORDER — EZETIMIBE 10 MG PO TABS: 10.0000 mg | ORAL_TABLET | Freq: Every day | ORAL | 3 refills | Status: AC

## 2024-02-10 NOTE — Telephone Encounter (Signed)
 Results reviewed with pt as per Wallis Bamberg NP's note.  Pt verbalized understanding and had no additional questions. Routed to PCP.

## 2024-02-10 NOTE — Telephone Encounter (Signed)
 Patient returned RN's call regarding results.

## 2024-02-10 NOTE — Telephone Encounter (Signed)
-----   Message from Herma KATHEE Blush sent at 02/10/2024  8:54 AM EDT -----

## 2024-02-13 DIAGNOSIS — Z9889 Other specified postprocedural states: Secondary | ICD-10-CM | POA: Diagnosis not present

## 2024-02-13 DIAGNOSIS — Z9089 Acquired absence of other organs: Secondary | ICD-10-CM | POA: Diagnosis not present

## 2024-02-13 DIAGNOSIS — C73 Malignant neoplasm of thyroid gland: Secondary | ICD-10-CM | POA: Diagnosis not present

## 2024-02-18 ENCOUNTER — Ambulatory Visit (HOSPITAL_BASED_OUTPATIENT_CLINIC_OR_DEPARTMENT_OTHER)
Admission: RE | Admit: 2024-02-18 | Discharge: 2024-02-18 | Disposition: A | Discharge status: Home / Self Care | Source: Ambulatory Visit | Attending: Cardiology | Admitting: Cardiology

## 2024-02-18 DIAGNOSIS — I480 Paroxysmal atrial fibrillation: Secondary | ICD-10-CM | POA: Diagnosis not present

## 2024-02-18 LAB — ECHOCARDIOGRAM COMPLETE
AR max vel: 1.33 cm2
AV Area VTI: 1.48 cm2
AV Area mean vel: 1.41 cm2
AV Mean grad: 7 mmHg
AV Peak grad: 11.7 mmHg
Ao pk vel: 1.71 m/s
Area-P 1/2: 4.1 cm2
Calc EF: 60.7 %
MV M vel: 3.51 m/s
MV Peak grad: 49.3 mmHg
S' Lateral: 2 cm
Single Plane A2C EF: 60.4 %
Single Plane A4C EF: 60.6 %

## 2024-03-11 ENCOUNTER — Other Ambulatory Visit: Payer: Self-pay | Admitting: Medical

## 2024-03-19 ENCOUNTER — Other Ambulatory Visit: Payer: Self-pay | Admitting: Medical

## 2024-03-30 NOTE — Progress Notes (Signed)
 Tyler LITTIE Lesches Sr.                                          MRN: [id number]   03/30/2024   The [facility] Quality Team Specialist reviewed this patient medical record for the purposes of chart review for care gap closure. The following were reviewed: chart review for care gap closure-glycemic status assessment.    [facility]BCI Quality Team

## 2024-04-02 ENCOUNTER — Other Ambulatory Visit: Payer: Self-pay | Admitting: Medical

## 2024-04-02 DIAGNOSIS — E118 Type 2 diabetes mellitus with unspecified complications: Secondary | ICD-10-CM

## 2024-04-09 ENCOUNTER — Other Ambulatory Visit: Payer: Self-pay | Admitting: Cardiology

## 2024-04-09 ENCOUNTER — Other Ambulatory Visit: Payer: Self-pay | Admitting: Medical

## 2024-04-14 ENCOUNTER — Telehealth: Payer: Self-pay | Admitting: *Deleted

## 2024-04-14 MED ORDER — FLECAINIDE ACETATE 50 MG PO TABS: 50.0000 mg | ORAL_TABLET | Freq: Two times a day (BID) | ORAL | 2 refills | Status: AC

## 2024-04-14 NOTE — Telephone Encounter (Signed)
 Rx refill sent to pharmacy.

## 2024-04-18 ENCOUNTER — Other Ambulatory Visit: Payer: Self-pay | Admitting: Medical

## 2024-04-24 ENCOUNTER — Ambulatory Visit: Admitting: Medical

## 2024-04-28 ENCOUNTER — Ambulatory Visit: Attending: Cardiology

## 2024-04-28 ENCOUNTER — Ambulatory Visit: Attending: Cardiology | Admitting: Cardiology

## 2024-04-28 ENCOUNTER — Encounter: Payer: Self-pay | Admitting: Cardiology

## 2024-04-28 VITALS — BP 134/88 | HR 92 | Ht 67.0 in | Wt 246.0 lb

## 2024-04-28 DIAGNOSIS — I471 Supraventricular tachycardia, unspecified: Secondary | ICD-10-CM

## 2024-04-28 DIAGNOSIS — I480 Paroxysmal atrial fibrillation: Secondary | ICD-10-CM

## 2024-04-28 DIAGNOSIS — I2510 Atherosclerotic heart disease of native coronary artery without angina pectoris: Secondary | ICD-10-CM | POA: Diagnosis not present

## 2024-04-28 DIAGNOSIS — E0590 Thyrotoxicosis, unspecified without thyrotoxic crisis or storm: Secondary | ICD-10-CM | POA: Diagnosis not present

## 2024-04-28 DIAGNOSIS — I10 Essential (primary) hypertension: Secondary | ICD-10-CM

## 2024-04-28 DIAGNOSIS — E785 Hyperlipidemia, unspecified: Secondary | ICD-10-CM | POA: Diagnosis not present

## 2024-04-28 NOTE — Addendum Note (Signed)
 Addended by: ARLOA MALLORY D on: 04/28/2024 10:53 AM   Modules accepted: Orders

## 2024-04-28 NOTE — Progress Notes (Signed)
 " Cardiology Office Note:    Date:  04/28/2024   ID:  Tyler Ellis Lesches Sr., DOB 1950-11-16, MRN [id number]  PCP:  Dorina Loving, PA-C  Cardiologist:  Lamar Fitch, MD    Referring MD: Dorina Loving, [place]   Chief Complaint  Patient presents with   Follow-up   have some dizzy spells  History of Present Illness:    Tyler Ellis. is a 74 y.o. male past medical history significant for coronary disease he did have cardiac catheterization 2019 showing only minimal disease with 10% of proximal and mid RCA he did have a stress test in November after which showed no evidence of ischemia additional problem include paroxysmal atrial fibrillation, rhythm control strategy with flecainide , he is anticoagulated, essential hypertension, diabetes, dyslipidemia.  Comes today to months for follow-up.  He is describing Sometimes to the last 2 weeks but his episode he got he was changing oil in his car he was laying down on the floor and then started having dizzy spells.  Did not passed out but when he tried to get up he became significantly dizzy to the point that he fell down he said he did not completely passed out.  He  felt some pounding in his head.  But no palpitations otherwise.  He described 2 more episodes that happened 1 time when he was sitting talking to extent started feeling that way and second episodes when he was trying to lay down.  He did not passed out.  At the same time he is able to do things he can walk climb stairs with no difficulties.  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   Enlarged prostate with urinary obstruction 05/11/2020   Essential  hypertension 03/11/2017   Hypertension    Hyperthyroidism 06/26/2017   Medication management 06/06/2020   Multinodular goiter 07/03/2017   Nocturia 02/02/2014   NSTEMI (non-ST elevated myocardial infarction) (HCC) 02/27/2017   Palpitations 10/28/2019   Paroxysmal atrial fibrillation (HCC) 05/24/2020   Supraventricular tachycardia 02/14/2018   Type 2 diabetes mellitus with complication, without long-term current use of insulin  (HCC) 03/11/2017    Past Surgical History:  Procedure Laterality Date   KNEE SURGERY     LEFT HEART CATH AND CORONARY ANGIOGRAPHY N/A 02/28/2017   Procedure: LEFT HEART CATH AND CORONARY ANGIOGRAPHY;  Surgeon: Verlin Lonni BIRCH, MD;  Location: [facility];  Service: Cardiovascular;  Laterality: N/A;   THYROIDECTOMY  08/2023    Current Medications: Active Medications[1]   Allergies:   Patient has no known allergies.   Social History   Socioeconomic History   Marital status: Divorced    Spouse name: Not on file   Number of children: Not on file   Years of education: Not on file   Highest education level: Not on file  Occupational History   Not on file  Tobacco Use   Smoking status: Never   Smokeless tobacco: Never  Vaping Use   Vaping status: Never Used  Substance and Sexual Activity   Alcohol use: Yes    Comment: weekly   Drug use: No   Sexual activity: Not on file  Other Topics  Concern   Not on file  Social History Narrative   Not on file   Social Drivers of Health   Tobacco Use: Low Risk (04/28/2024)   Patient History    Smoking Tobacco Use: Never    Smokeless Tobacco Use: Never    Passive Exposure: Not on file  Financial Resource Strain: Low Risk (05/09/2023)   Overall Financial Resource Strain (CARDIA)    Difficulty of Paying Living Expenses: Not hard at all  Food Insecurity: Low Risk (08/30/2023)   Received from [facility]   Epic    Within the past 12 months, you worried that your food would run out before you got money to  buy more: Never true    Within the past 12 months, the food you bought just didn't last and you didn't have money to get more. : Never true  Transportation Needs: No Transportation Needs (08/30/2023)   Received from [facility]    In the past 12 months, has lack of reliable transportation kept you from medical appointments, meetings, work or from getting things needed for daily living? : No  Physical Activity: Insufficiently Active (05/09/2023)   Exercise Vital Sign    Days of Exercise per Week: 2 days    Minutes of Exercise per Session: 20 min  Stress: No Stress Concern Present (05/09/2023)   [employer] of Occupational Health - Occupational Stress Questionnaire    Feeling of Stress : Not at all  Social Connections: Moderately Integrated (05/09/2023)   Social Connection and Isolation Panel    Frequency of Communication with Friends and Family: More than three times a week    Frequency of Social Gatherings with Friends and Family: More than three times a week    Attends Religious Services: More than 4 times per year    Active Member of Clubs or Organizations: Yes    Attends Banker Meetings: More than 4 times per year    Marital Status: Divorced  Depression (PHQ2-9): Low Risk (05/09/2023)   Depression (PHQ2-9)    PHQ-2 Score: 0  Alcohol Screen: Low Risk (05/09/2023)   Alcohol Screen    Last Alcohol Screening Score (AUDIT): 0  Housing: Low Risk (04/07/2024)   Received from [facility]   Epic    What is your living situation today?: I have a steady place to live    Think about the place you live. Do you have problems with any of the following? Choose all that apply:: None/None on this list  Utilities: Low Risk (08/30/2023)   Received from [facility]   Utilities    In the past 12 months has the electric, gas, oil, or water company threatened to shut off services in your home? : No  Health Literacy: Adequate Health Literacy (05/09/2023)   [facility]  Literacy    Frequency of need for help with medical instructions: Never     Family History: The patient's family history includes Hypertension in his father and mother. ROS:   Please see the history of present illness.    All 14 point review of systems negative except as described per history of present illness  EKGs/Labs/Other Studies Reviewed:    EKG Interpretation Date/Time:  Tuesday April 28 2024 10:03:21 EST Ventricular Rate:  92 PR Interval:  178 QRS Duration:  76 QT Interval:  364 QTC Calculation: 450 R Axis:   -16  Text Interpretation: Normal sinus rhythm Normal ECG When compared with ECG of 31-Jan-2024 10:49, No significant change  was found Confirmed by Bernie Charleston [phone]) on 04/28/2024 10:15:31 AM    Recent Labs: 01/31/2024: ALT 13; BUN 11; Creatinine, Ser 1.09; Hemoglobin 16.1; NT-Pro BNP 177; Platelets 252; Potassium 4.3; Sodium 140  Recent Lipid Panel    Component Value Date/Time   CHOL 114 11/27/2021 0827   CHOL 118 03/23/2021 1515   TRIG 112.0 11/27/2021 0827   HDL 38.70 (L) 11/27/2021 0827   HDL 43 03/23/2021 1515   CHOLHDL 3 11/27/2021 0827   VLDL 22.4 11/27/2021 0827   LDLCALC 53 11/27/2021 0827   LDLCALC 63 03/23/2021 1515   LDLDIRECT 86 01/31/2024 1126    Physical Exam:    VS:  BP 134/88   Pulse 92   Ht 5' 7 (1.702 m)   Wt 246 lb (111.6 kg)   SpO2 98%   BMI 38.53 kg/m     Wt Readings from Last 3 Encounters:  04/28/24 246 lb (111.6 kg)  01/31/24 248 lb (112.5 kg)  05/24/23 255 lb 3.2 oz (115.8 kg)     GEN:  Well nourished, well developed in no acute distress HEENT: Normal NECK: No JVD; No carotid bruits LYMPHATICS: No lymphadenopathy CARDIAC: RRR, no murmurs, no rubs, no gallops RESPIRATORY:  Clear to auscultation without rales, wheezing or rhonchi  ABDOMEN: Soft, non-tender, non-distended MUSCULOSKELETAL:  No edema; No deformity  SKIN: Warm and dry LOWER EXTREMITIES: no swelling NEUROLOGIC:  Alert and oriented x  3 PSYCHIATRIC:  Normal affect   ASSESSMENT:    1. Paroxysmal atrial fibrillation (HCC)   2. Essential hypertension   3. Supraventricular tachycardia   4. Coronary artery disease involving native coronary artery of native heart without angina pectoris   5. Hyperthyroidism   6. Dyslipidemia    PLAN:    In order of problems listed above:  Paroxysmal atrial fibrillation, maintained sinus rhythm.  He does have some dizziness which are very worrisome.  He did have flecainide  level checked about 2 months ago which was low, continue 50 mg of flecainide  twice daily, no EKG changes normal QRS complex normal QT interval, I will ask him to wear Zio patch AT to make sure this is not related to arrhythmia.  Continue anticoagulation in the meantime. Essential hypertension: Blood pressure well-controlled continue present management. Coronary disease asymptomatic, today will check complete metabolic panel as well as proBNP. Dyslipidemia taking Lipitor  80 which I will continue.  I did review [facility] which show me data from October of this year LDL 86 HDL 38 continue present management for now until we have better understanding of the situation   Medication Adjustments/Labs and Tests Ordered: Current medicines are reviewed at length with the patient today.  Concerns regarding medicines are outlined above.  Orders Placed This Encounter  Procedures   EKG 12-Lead   Medication changes: No orders of the defined types were placed in this encounter.   Signed, [place] DOROTHA Bernie, MD, [employer] 04/28/2024 10:29 AM    [facility]    [1]  Current Meds  Medication Sig   ALPRAZolam (XANAX) 0.5 MG tablet Take 0.25-0.5 mg by mouth 3 (three) times daily as needed for anxiety.   apixaban  (ELIQUIS ) 5 MG TABS tablet Take 1 tablet (5 mg total) by mouth 2 (two) times daily.   atorvastatin  (LIPITOR ) 80 MG tablet Take 1 tablet (80 mg total) by mouth daily.   azelastine  (ASTELIN ) 0.1 % nasal spray  Place 2 sprays into both nostrils 2 (two) times daily. Use in each nostril as directed  BD PEN NEEDLE NANO 2ND GEN 32G X 4 MM MISC USE 1 NEEDLE WITH INSULIN  PEN AS DIRECTED   cloNIDine  (CATAPRES ) 0.1 MG tablet Take 1 tablet (0.1 mg total) by mouth daily.   diltiazem  (CARDIZEM  CD) 360 MG 24 hr capsule Take 1 capsule (360 mg total) by mouth daily.   empagliflozin (JARDIANCE) 25 MG TABS tablet Take 25 mg by mouth daily.   esomeprazole (NEXIUM) 20 MG capsule Take 20 mg by mouth daily at 12 noon.   ezetimibe  (ZETIA ) 10 MG tablet Take 1 tablet (10 mg total) by mouth daily.   finasteride (PROSCAR) 5 MG tablet Take 5 mg by mouth daily.   flecainide  (TAMBOCOR ) 50 MG tablet Take 1 tablet (50 mg total) by mouth 2 (two) times daily.   glipiZIDE (GLUCOTROL) 10 MG tablet Take 1 tablet (10 mg total) by mouth 2 (two) times daily.   glucose blood (ACCU-CHEK AVIVA PLUS) test strip Check blood sugar twice a day.  Dx: E11.9 (Patient taking differently: 1 each by Other route as needed for other (see below). Check blood sugar twice a day.  Dx: E11.9)   guaiFENesin (MUCINEX) 600 MG 12 hr tablet Take 600 mg by mouth daily as needed for to loosen phlegm or cough.   hydrOXYzine (ATARAX/VISTARIL) 10 MG tablet Take 10 mg by mouth in the morning and at bedtime.   insulin  glargine (LANTUS SOLOSTAR) 100 UNIT/ML Solostar Pen INJECT 30 UNITS UNDER THE SKIN EVERY MORNING AND 50 UNITS EVERY EVENING   levothyroxine (SYNTHROID) 200 MCG tablet Take 200 mcg by mouth daily before breakfast.   losartan  (COZAAR ) 50 MG tablet TAKE 1 TABLET(50 MG) BY MOUTH DAILY   metFORMIN  (GLUCOPHAGE ) 1000 MG tablet Take 1 tablet (1,000 mg total) by mouth 2 (two) times daily with a meal.   metoprolol  (TOPROL -XL) 200 MG 24 hr tablet TAKE 1 TABLET(200 MG) BY MOUTH DAILY   nitroGLYCERIN  (NITROSTAT ) 0.4 MG SL tablet Place 1 tablet (0.4 mg total) under the tongue every 5 (five) minutes as needed for chest pain.   OVER THE COUNTER MEDICATION Take 1 tablet by  mouth daily. Beta Prostate   potassium chloride  (KLOR-CON ) 10 MEQ tablet Take 1 tablet (10 mEq total) by mouth daily.   tamsulosin  (FLOMAX ) 0.4 MG CAPS capsule Take 1 capsule (0.4 mg total) by mouth daily after supper.   torsemide  (DEMADEX ) 20 MG tablet Take 1 tablet (20 mg total) by mouth daily.   "

## 2024-04-28 NOTE — Patient Instructions (Signed)
 Medication Instructions:  Your physician recommends that you continue on your current medications as directed. Please refer to the Current Medication list given to you today.  *If you need a refill on your cardiac medications before your next appointment, please call your pharmacy*   Lab Work: 3rd Floor [address] CMP, Thyroid  panel with TSH- today If you have labs (blood work) drawn today and your tests are completely normal, you will receive your results only by: MyChart Message (if you have MyChart) OR A paper copy in the mail If you have any lab test that is abnormal or we need to change your treatment, we will call you to review the results.   Testing/Procedures: None Ordered   Follow-Up: At [facility], you and your health needs are our priority.  As part of our continuing mission to provide you with exceptional heart care, we have created designated Provider Care Teams.  These Care Teams include your primary Cardiologist (physician) and Advanced Practice Providers (APPs -  Physician Assistants and Nurse Practitioners) who all work together to provide you with the care you need, when you need it.  We recommend signing up for the patient portal called MyChart.  Sign up information is provided on this After Visit Summary.  MyChart is used to connect with patients for Virtual Visits (Telemedicine).  Patients are able to view lab/test results, encounter notes, upcoming appointments, etc.  Non-urgent messages can be sent to your provider as well.   To learn more about what you can do with MyChart, go to [email or web address].    Your next appointment:   2 month(s)  The format for your next appointment:   In Person  Provider:   Lamar Fitch, MD    Other Instructions NA

## 2024-04-29 LAB — COMPREHENSIVE METABOLIC PANEL WITH GFR
ALT: 14 IU/L (ref 0–44)
AST: 15 IU/L (ref 0–40)
Albumin: 4.4 g/dL (ref 3.8–4.8)
Alkaline Phosphatase: 111 IU/L (ref 47–123)
BUN/Creatinine Ratio: 7 — ABNORMAL LOW (ref 10–24)
BUN: 8 mg/dL (ref 8–27)
Bilirubin Total: 0.4 mg/dL (ref 0.0–1.2)
CO2: 15 mmol/L — ABNORMAL LOW (ref 20–29)
Calcium: 10.2 mg/dL (ref 8.6–10.2)
Chloride: 105 mmol/L (ref 96–106)
Creatinine, Ser: 1.09 mg/dL (ref 0.76–1.27)
Globulin, Total: 2.7 g/dL (ref 1.5–4.5)
Glucose: 107 mg/dL — ABNORMAL HIGH (ref 70–99)
Potassium: 4.5 mmol/L (ref 3.5–5.2)
Sodium: 140 mmol/L (ref 134–144)
Total Protein: 7.1 g/dL (ref 6.0–8.5)
eGFR: 72 mL/min/1.73

## 2024-04-29 LAB — THYROID PANEL WITH TSH
Free Thyroxine Index: 3.6 (ref 1.2–4.9)
T3 Uptake Ratio: 31 % (ref 24–39)
T4, Total: 11.5 ug/dL (ref 4.5–12.0)
TSH: 0.742 u[IU]/mL (ref 0.450–4.500)

## 2024-05-06 ENCOUNTER — Ambulatory Visit: Payer: Self-pay | Admitting: Cardiology

## 2024-05-11 ENCOUNTER — Telehealth: Payer: Self-pay

## 2024-05-11 NOTE — Telephone Encounter (Signed)
 Left message on My Chart with lab results per Dr. Karry note. Routed to PCP

## 2024-05-12 ENCOUNTER — Other Ambulatory Visit: Payer: Self-pay | Admitting: Medical

## 2024-05-14 ENCOUNTER — Ambulatory Visit: Payer: Self-pay | Admitting: Medical

## 2024-05-14 ENCOUNTER — Encounter: Payer: Self-pay | Admitting: Medical

## 2024-05-14 ENCOUNTER — Telehealth: Payer: Self-pay

## 2024-05-14 ENCOUNTER — Ambulatory Visit: Admitting: Medical

## 2024-05-14 VITALS — BP 110/70 | HR 71 | Temp 98.2°F | Resp 14 | Ht 67.0 in | Wt 252.2 lb

## 2024-05-14 DIAGNOSIS — I480 Paroxysmal atrial fibrillation: Secondary | ICD-10-CM

## 2024-05-14 DIAGNOSIS — I10 Essential (primary) hypertension: Secondary | ICD-10-CM

## 2024-05-14 DIAGNOSIS — E890 Postprocedural hypothyroidism: Secondary | ICD-10-CM

## 2024-05-14 DIAGNOSIS — R42 Dizziness and giddiness: Secondary | ICD-10-CM

## 2024-05-14 LAB — CBC WITH DIFFERENTIAL/PLATELET
Basophils Absolute: 0 K/uL (ref 0.0–0.1)
Basophils Relative: 0.5 % (ref 0.0–3.0)
Eosinophils Absolute: 0.1 K/uL (ref 0.0–0.7)
Eosinophils Relative: 0.7 % (ref 0.0–5.0)
HCT: 46.3 % (ref 39.0–52.0)
Hemoglobin: 15.3 g/dL (ref 13.0–17.0)
Lymphocytes Relative: 21.5 % (ref 12.0–46.0)
Lymphs Abs: 1.8 K/uL (ref 0.7–4.0)
MCHC: 33 g/dL (ref 30.0–36.0)
MCV: 92.3 fl (ref 78.0–100.0)
Monocytes Absolute: 0.6 K/uL (ref 0.1–1.0)
Monocytes Relative: 6.6 % (ref 3.0–12.0)
Neutro Abs: 6 K/uL (ref 1.4–7.7)
Neutrophils Relative %: 70.7 % (ref 43.0–77.0)
Platelets: 201 K/uL (ref 150.0–400.0)
RBC: 5.01 Mil/uL (ref 4.22–5.81)
RDW: 14.5 % (ref 11.5–15.5)
WBC: 8.5 K/uL (ref 4.0–10.5)

## 2024-05-14 NOTE — Patient Instructions (Signed)
 Dizziness, likely orthostatic Dizziness likely due to orthostatic changes. Differential includes postural hypotension and potential anemia. Awaiting heart monitor results to rule out arrhythmias. - Ordered CBC to rule out anemia. - Advised caution with position changes. - Instructed to stay hydrated. - Advised to seek emergency care if dizziness is accompanied by headache, nausea, vomiting, or tingling.(cardiac or neurologic signs/symptoms)  Essential hypertension Blood pressure well controlled. Potential for orthostatic hypotension due to well-controlled blood pressure. - Check blood pressure daily and report readings in one week - Consider reducing losartan  dose if blood pressure is consistently low.  Paroxysmal atrial fibrillation and htn Managed with flecainide . Recent EKG showed normal sinus rhythm. Awaiting heart monitor results. - Continue flecainide  50 mg twice daily. - Await heart monitor review by cardiologist. -continue same rate controlling and bp meds currently on. -if bp tends close or lower than 110 systolic consider decrease losartan  dose  Post-surgical hypothyroidism Managed with levothyroxine. Recent TSH and T4 levels indicate need for dosage adjustment. Endocrinologist adjusted dosage to prevent hyperthyroidism. - Continue adjusted levothyroxine dosage as per endocrinologist's recommendation.  Follow up date to be determined based on cbc, ziopatch results and bp level check. (as needed as well)

## 2024-05-14 NOTE — Telephone Encounter (Signed)
 Pt left his coat with cell phn and keys. Have tried to call both daughters but get no answer. Phn just continues to ring. Jacket with belongings left in front office

## 2024-05-14 NOTE — Progress Notes (Signed)
 "  Subjective:    Patient ID: Tyler LITTIE Arloa Chrystal., male    DOB: 1950-07-09, 74 y.o.   MRN: [id number]  HPI  Pt in for follow up with thyroid  concerns. Just saw cardiologist.    04-28-24 Cardiologist:  Lamar Fitch, MD     Referring MD: Dorina Loving, [place]        Chief Complaint  Patient presents with   Follow-up   have some dizzy spells   History of Present Illness:     Tyler Chain. is a 74 y.o. male past medical history significant for coronary disease he did have cardiac catheterization 2019 showing only minimal disease with 10% of proximal and mid RCA he did have a stress test in November after which showed no evidence of ischemia additional problem include paroxysmal atrial fibrillation, rhythm control strategy with flecainide , he is anticoagulated, essential hypertension, diabetes, dyslipidemia.  Comes today to months for follow-up.  He is describing Sometimes to the last 2 weeks but his episode he got he was changing oil in his car he was laying down on the floor and then started having dizzy spells.  Did not passed out but when he tried to get up he became significantly dizzy to the point that he fell down he said he did not completely passed out.  He  felt some pounding in his head.  But no palpitations otherwise.  He described 2 more episodes that happened 1 time when he was sitting talking to extent started feeling that way and second episodes when he was trying to lay down.  He did not passed out.  At the same time he is able to do things he can walk climb stairs with no difficulties.  Described maybe a little more shortness of breath than before but no chest pain tightness pressure is back and chest   Text Interpretation:Normal sinus rhythm Normal ECG When compared with ECG of 31-Jan-2024 10:49, No significant change was found Confirmed by Fitch Lamar [phone]) on 04/28/2024 10:15:31 AM    ASSESSMENT:     1. Paroxysmal atrial fibrillation (HCC)   2. Essential  hypertension   3. Supraventricular tachycardia   4. Coronary artery disease involving native coronary artery of native heart without angina pectoris   5. Hyperthyroidism   6. Dyslipidemia     PLAN:     In order of problems listed above:   Paroxysmal atrial fibrillation, maintained sinus rhythm.  He does have some dizziness which are very worrisome.  He did have flecainide  level checked about 2 months ago which was low, continue 50 mg of flecainide  twice daily, no EKG changes normal QRS complex normal QT interval, I will ask him to wear Zio patch AT to make sure this is not related to arrhythmia.  Continue anticoagulation in the meantime. Essential hypertension: Blood pressure well-controlled continue present management. Coronary disease asymptomatic, today will check complete metabolic panel as well as proBNP. Dyslipidemia taking Lipitor  80 which I will continue.  I did review [facility] which show me data from October of this year LDL 86 HDL 38 continue present management for now until we have better understanding of the situation    Tyler RAPPAPORT Sr. is a 74 year old male with paroxysmal atrial fibrillation who presents with dizziness.  He has had dizziness for the past two to three weeks, mainly when lying flat and with position changes from lying to sitting or standing. He describes a sensation that everything shifted. About a week ago  he became dizzy while standing to change oil and fell back down. No report of any injury. No LOC.  He has thyroid  cancer treated with total thyroidectomy on May 9 and takes levothyroxine 200 mcg daily with half a tablet on Sundays after a recent adjustment. Recent labs showed TSH 0.856 and T4 1.5.  He has paroxysmal atrial fibrillation and supraventricular tachycardia and takes flecainide  50 mg twice daily. He recently completed a two-week heart monitor and has not yet received the results. In office EKG with cardiologist recently nsr.  He monitors blood  pressure at home with typical readings around 134/88 mmHg. He takes losartan  50 mg, torsemide , metoprolol , and Catapres  for blood pressure control.     Review of Systems  Constitutional:  Negative for chills, fatigue and fever.  HENT:  Negative for congestion and ear pain.   Respiratory:  Negative for cough, chest tightness and wheezing.   Cardiovascular:  Negative for chest pain and palpitations.  Gastrointestinal:  Negative for abdominal pain, constipation and nausea.  Genitourinary:  Negative for dysuria and frequency.  Musculoskeletal:  Negative for back pain and myalgias.  Skin:  Negative for rash.  Neurological:  Negative for dizziness, syncope and light-headedness.       No dizziness presently.  Hematological:  Negative for adenopathy.  Psychiatric/Behavioral:  Negative for behavioral problems and hallucinations. The patient is not nervous/anxious.     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   Enlarged prostate with urinary obstruction 05/11/2020   Essential hypertension 03/11/2017   Hypertension    Hyperthyroidism 06/26/2017   Medication management 06/06/2020   Multinodular goiter 07/03/2017   Nocturia 02/02/2014   NSTEMI (non-ST elevated myocardial infarction) (HCC) 02/27/2017   Palpitations 10/28/2019   Paroxysmal atrial fibrillation (HCC) 05/24/2020   Supraventricular tachycardia 02/14/2018   Type 2 diabetes mellitus with complication, without long-term current use of insulin  (HCC) 03/11/2017     Social History   Socioeconomic History   Marital status: Divorced    Spouse name: Not on file   Number of children: Not on file   Years of education: Not on file   Highest education level: Not on file  Occupational History   Not on file  Tobacco Use    Smoking status: Never   Smokeless tobacco: Never  Vaping Use   Vaping status: Never Used  Substance and Sexual Activity   Alcohol use: Yes    Comment: weekly   Drug use: No   Sexual activity: Not on file  Other Topics Concern   Not on file  Social History Narrative   Not on file   Social Drivers of Health   Tobacco Use: Low Risk (05/14/2024)   Patient History    Smoking Tobacco Use: Never    Smokeless Tobacco Use: Never    Passive Exposure: Not on file  Financial Resource Strain: Low Risk (05/09/2023)   Overall Financial Resource Strain (CARDIA)    Difficulty of Paying Living Expenses: Not hard at all  Food Insecurity: Low Risk (08/30/2023)   Received from [facility]   Epic    Within the past 12 months, you worried that your food would run out before you got money to buy more: Never true    Within the past 12 months, the food you  bought just didn't last and you didn't have money to get more. : Never true  Transportation Needs: No Transportation Needs (08/30/2023)   Received from [facility]   Transportation    In the past 12 months, has lack of reliable transportation kept you from medical appointments, meetings, work or from getting things needed for daily living? : No  Physical Activity: Insufficiently Active (05/09/2023)   Exercise Vital Sign    Days of Exercise per Week: 2 days    Minutes of Exercise per Session: 20 min  Stress: No Stress Concern Present (05/09/2023)   [employer] of Occupational Health - Occupational Stress Questionnaire    Feeling of Stress : Not at all  Social Connections: Moderately Integrated (05/09/2023)   Social Connection and Isolation Panel    Frequency of Communication with Friends and Family: More than three times a week    Frequency of Social Gatherings with Friends and Family: More than three times a week    Attends Religious Services: More than 4 times per year    Active Member of [employer] or Organizations: Yes    Attends Tax Inspector Meetings: More than 4 times per year    Marital Status: Divorced  Intimate Partner Violence: Not At Risk (05/09/2023)   Humiliation, Afraid, Rape, and Kick questionnaire    Fear of Current or Ex-Partner: No    Emotionally Abused: No    Physically Abused: No    Sexually Abused: No  Depression (PHQ2-9): Low Risk (05/14/2024)   Depression (PHQ2-9)    PHQ-2 Score: 0  Alcohol Screen: Low Risk (05/09/2023)   Alcohol Screen    Last Alcohol Screening Score (AUDIT): 0  Housing: Low Risk (04/07/2024)   Received from [facility]   Epic    What is your living situation today?: I have a steady place to live    Think about the place you live. Do you have problems with any of the following? Choose all that apply:: None/None on this list  Utilities: Low Risk (08/30/2023)   Received from [facility]   Utilities    In the past 12 months has the electric, gas, oil, or water company threatened to shut off services in your home? : No  Health Literacy: Adequate Health Literacy (05/09/2023)   B1300 Health Literacy    Frequency of need for help with medical instructions: Never    Past Surgical History:  Procedure Laterality Date   KNEE SURGERY     LEFT HEART CATH AND CORONARY ANGIOGRAPHY N/A 02/28/2017   Procedure: LEFT HEART CATH AND CORONARY ANGIOGRAPHY;  Surgeon: Verlin Lonni BIRCH, MD;  Location: [facility];  Service: Cardiovascular;  Laterality: N/A;   THYROIDECTOMY  08/2023    Family History  Problem Relation Age of Onset   Hypertension Father    Hypertension Mother     Allergies[1]  Medications Ordered Prior to Encounter[2]  BP 110/70   Pulse 71   Temp 98.2 F (36.8 C) (Oral)   Resp 14   Ht 5' 7 (1.702 m)   Wt 252 lb 3.2 oz (114.4 kg)   SpO2 96%   BMI 39.50 kg/m        Objective:   Physical Exam  General Mental Status- Alert. General Appearance- Not in acute distress.   Skin General: Color- Normal Color. Moisture- Normal  Moisture.  Neck Carotid Arteries- Normal color. Moisture- Normal Moisture. No carotid bruits. No JVD.  Chest and Lung Exam Auscultation: Breath Sounds:-CTA  Cardiovascular Auscultation:Rythm- Regular.  Murmurs & Other Heart Sounds:Auscultation of the heart reveals- No Murmurs.  Abdomen Inspection:-Inspeection Normal. Palpation/Percussion:Note:No mass. Palpation and Percussion of the abdomen reveal- Non Tender, Non Distended + BS, no rebound or guarding.    Neurologic Cranial Nerve exam:- CN III-XII intact(No nystagmus), symmetric smile. Drift Test:- No drift. Finger to Nose:- Normal/Intact Strength:- 5/5 equal and symmetric strength both upper and lower extremities.  On lying supine and turning head no vertigo.  Going from supine to sitting did feel slight light headed only 2 seconds then resolved      Assessment & Plan:  Dizziness, likely orthostatic Dizziness likely due to orthostatic changes. Differential includes postural hypotension and potential anemia. Awaiting heart monitor results to rule out arrhythmias. - Ordered CBC to rule out anemia. - Advised caution with position changes. - Instructed to stay hydrated. - Advised to seek emergency care if dizziness is accompanied by headache, nausea, vomiting, or tingling.(cardiac or neurologic signs/symptoms)  Essential hypertension Blood pressure well controlled. Potential for orthostatic hypotension due to well-controlled blood pressure. - Check blood pressure daily and report readings in one week - Consider reducing losartan  dose if blood pressure is consistently low.  Paroxysmal atrial fibrillation and htn Managed with flecainide . Recent EKG showed normal sinus rhythm. Awaiting heart monitor results. - Continue flecainide  50 mg twice daily. - Await heart monitor review by cardiologist. -continue same rate controlling and bp meds currently on. -if bp tends close or lower than 110 systolic consider decrease losartan   dose  Post-surgical hypothyroidism Managed with levothyroxine. Recent TSH and T4 levels indicate need for dosage adjustment. Endocrinologist adjusted dosage to prevent hyperthyroidism. - Continue adjusted levothyroxine dosage as per endocrinologist's recommendation.  Follow up date to be determined based on cbc, ziopatch results and bp level check. (as needed as well)   Geneva Barrero, PA-C    I personally spent a total of 40 minutes in the care of the patient today including getting/reviewing separately obtained history, performing a medically appropriate exam/evaluation, counseling and educating, placing orders, and documenting clinical information in the EHR.     [1] No Known Allergies [2]  Current Outpatient Medications on File Prior to Visit  Medication Sig Dispense Refill   apixaban  (ELIQUIS ) 5 MG TABS tablet Take 1 tablet (5 mg total) by mouth 2 (two) times daily.     atorvastatin  (LIPITOR ) 80 MG tablet TAKE 1 TABLET(80 MG) BY MOUTH DAILY 30 tablet 0   BD PEN NEEDLE NANO 2ND GEN 32G X 4 MM MISC USE 1 NEEDLE WITH INSULIN  PEN AS DIRECTED 100 each 5   cloNIDine  (CATAPRES ) 0.1 MG tablet Take 1 tablet (0.1 mg total) by mouth daily. 90 tablet 2   diltiazem  (CARDIZEM  CD) 360 MG 24 hr capsule Take 1 capsule (360 mg total) by mouth daily. 90 capsule 1   empagliflozin (JARDIANCE) 25 MG TABS tablet Take 25 mg by mouth daily.     esomeprazole (NEXIUM) 20 MG capsule Take 20 mg by mouth daily at 12 noon.     ezetimibe  (ZETIA ) 10 MG tablet Take 1 tablet (10 mg total) by mouth daily. 90 tablet 3   finasteride (PROSCAR) 5 MG tablet Take 5 mg by mouth daily.     flecainide  (TAMBOCOR ) 50 MG tablet Take 1 tablet (50 mg total) by mouth 2 (two) times daily. 180 tablet 2   glipiZIDE (GLUCOTROL) 10 MG tablet Take 1 tablet (10 mg total) by mouth 2 (two) times daily. 180 tablet 0   glucose blood (ACCU-CHEK AVIVA PLUS) test strip Check  blood sugar twice a day.  Dx: E11.9 (Patient taking differently: 1 each  by Other route as needed for other (see below). Check blood sugar twice a day.  Dx: E11.9) 200 each 1   guaiFENesin (MUCINEX) 600 MG 12 hr tablet Take 600 mg by mouth daily as needed for to loosen phlegm or cough.     hydrOXYzine (ATARAX/VISTARIL) 10 MG tablet Take 10 mg by mouth in the morning and at bedtime.     insulin  glargine (LANTUS SOLOSTAR) 100 UNIT/ML Solostar Pen INJECT 30 UNITS UNDER THE SKIN EVERY MORNING AND 50 UNITS EVERY EVENING 60 mL 1   levothyroxine (SYNTHROID) 200 MCG tablet Take 200 mcg by mouth daily before breakfast.     losartan  (COZAAR ) 50 MG tablet TAKE 1 TABLET(50 MG) BY MOUTH DAILY 90 tablet 1   metFORMIN  (GLUCOPHAGE ) 1000 MG tablet Take 1 tablet (1,000 mg total) by mouth 2 (two) times daily with a meal. 180 tablet 0   metoprolol  (TOPROL -XL) 200 MG 24 hr tablet TAKE 1 TABLET(200 MG) BY MOUTH DAILY 30 tablet 0   nitroGLYCERIN  (NITROSTAT ) 0.4 MG SL tablet Place 1 tablet (0.4 mg total) under the tongue every 5 (five) minutes as needed for chest pain. 25 tablet 2   OVER THE COUNTER MEDICATION Take 1 tablet by mouth daily. Beta Prostate     potassium chloride  (KLOR-CON ) 10 MEQ tablet Take 1 tablet (10 mEq total) by mouth daily. 90 tablet 2   torsemide  (DEMADEX ) 20 MG tablet Take 1 tablet (20 mg total) by mouth daily. 90 tablet 2   azelastine  (ASTELIN ) 0.1 % nasal spray Place 2 sprays into both nostrils 2 (two) times daily. Use in each nostril as directed (Patient not taking: Reported on 05/14/2024) 30 mL 12   No current facility-administered medications on file prior to visit.   "

## 2024-05-15 ENCOUNTER — Telehealth: Payer: Self-pay | Admitting: Medical

## 2024-05-15 ENCOUNTER — Telehealth: Payer: Self-pay

## 2024-05-15 NOTE — Telephone Encounter (Signed)
 Lab Results reviewed with pt as per Dr. Karry note.  Pt verbalized understanding and had no additional questions. Routed to PCP

## 2024-05-15 NOTE — Telephone Encounter (Signed)
 Will you call pt and check to see if he is on eliquis . Please update me.

## 2024-05-25 DIAGNOSIS — I480 Paroxysmal atrial fibrillation: Secondary | ICD-10-CM | POA: Diagnosis not present

## 2024-06-30 ENCOUNTER — Ambulatory Visit: Admitting: Cardiology
